# Patient Record
Sex: Male | Born: 1975 | Race: Black or African American | Hispanic: No | Marital: Single
Health system: Southern US, Community
[De-identification: ages and names within clinical notes are randomized; demographics above are authoritative.]

## PROBLEM LIST (undated history)

## (undated) DIAGNOSIS — J45909 Unspecified asthma, uncomplicated: Secondary | ICD-10-CM

## (undated) DIAGNOSIS — E559 Vitamin D deficiency, unspecified: Secondary | ICD-10-CM

---

## 2002-07-29 ENCOUNTER — Emergency Department (HOSPITAL_COMMUNITY): Admission: EM | Admit: 2002-07-29 | Discharge: 2002-07-29 | Payer: Self-pay | Admitting: Emergency Medicine

## 2002-08-04 ENCOUNTER — Emergency Department (HOSPITAL_COMMUNITY): Admission: EM | Admit: 2002-08-04 | Discharge: 2002-08-04 | Payer: Self-pay | Admitting: Emergency Medicine

## 2006-09-07 ENCOUNTER — Emergency Department (HOSPITAL_COMMUNITY): Admission: EM | Admit: 2006-09-07 | Discharge: 2006-09-07 | Payer: Self-pay | Admitting: Emergency Medicine

## 2006-09-23 ENCOUNTER — Inpatient Hospital Stay (HOSPITAL_COMMUNITY): Admission: EM | Admit: 2006-09-23 | Discharge: 2006-09-30 | Payer: Self-pay | Admitting: Emergency Medicine

## 2006-09-23 ENCOUNTER — Ambulatory Visit: Payer: Self-pay | Admitting: Hospitalist

## 2007-02-12 ENCOUNTER — Emergency Department (HOSPITAL_COMMUNITY): Admission: EM | Admit: 2007-02-12 | Discharge: 2007-02-12 | Payer: Self-pay | Admitting: Family Medicine

## 2010-04-14 ENCOUNTER — Emergency Department (HOSPITAL_COMMUNITY): Admission: EM | Admit: 2010-04-14 | Discharge: 2010-04-14 | Payer: Self-pay | Admitting: Emergency Medicine

## 2010-10-05 NOTE — Discharge Summary (Signed)
NAME:  Jacob Ho, Jacob Ho            ACCOUNT NO.:  [id number]   MEDICAL RECORD NO.:  [id number]          PATIENT TYPE:  INP   LOCATION:  6743                         FACILITY:  [facility]   PHYSICIAN:  Beatrix Fetters, M.D.     DATE OF BIRTH:  [birth year]   DATE OF ADMISSION:  09/23/2006  DATE OF DISCHARGE:  10/18/1998                               DISCHARGE SUMMARY   CONTINUITY PHYSICIAN:  Beatrix Fetters, M.D. in the [facility].   DISCHARGE DIAGNOSES:  1. Asthma exacerbation.  2. Asthma, chronic.   DISCHARGE MEDICATIONS:  1. Advair Diskus 250/50 one puff b.i.d.  2. Albuterol MDI 90 mcg 1-2 puffs every 6 hours p.r.n.  3. Prednisone 60 mg x1, 40 mg x1, 30 mg x1, 20 mg x1, 10 mg x1, and      then 10 mg x1.   DISPOSITION AND FOLLOWUP:  The patient is to follow up with Dr. Kathyrn Sheriff  in the [facility] on May 28 at 3:15 p.m.  At that  time, we have to review his asthma medication regimen as well as check  his peak load.   ADMITTING HISTORY AND PHYSICAL:  The patient is a 35 year old man with a  past medical history of asthma with a history of an asthma exacerbation  requiring intubation at age 75 as well as multiple emergency department  visits and one other asthma exacerbation requiring hospitalization in  his 12s, presenting with a 1-day history of difficulty breathing.  The  patient had been seen in the emergency department 2 weeks prior for his  first asthma exacerbation in 5 years.  He moved to [place]  approximately 1 year ago and has had difficulty with his seasonal  allergies since moving here.  When he was discharged 3 weeks ago, he was  discharged with an albuterol inhaler to be used as needed as well as a  prednisone taper; however, since his visit to the emergency department,  he has been having asthma symptoms requiring the albuterol inhaler 4  times a day as well as waking up in the middle of the night unable to  breathe 3 times a week.  Two  days prior to admission, the patient  realized that he was becoming more short of breath, but he could not  find his albuterol inhaler.  On the night prior to admission, his  shortness of breath was also very severe, but the patient went to bed  hoping that it would go away, and he woke up on the morning of admission  completely and totally unable to breathe and called 911.  He was  subsequently brought to the emergency department and given 125 mg of IV  Solu-Medrol and albuterol nebs with moderate improvement.   He has no known drug allergies, and he takes no regular medications.   PHYSICAL EXAMINATION:  VITAL SIGNS:  Temperature was 98.3, pulse was 89,  respirations 22, blood pressure 97/68, with an O2 sat of 93% on 2 liters  of oxygen.  CARDIOVASCULAR EXAM:  Significant for a mild tachycardic but regular  rhythm with no murmurs, rubs,  or gallops.  LUNGS EXAM:  Significant for severe inspiratory and expiratory wheezes  diffusely throughout both lung fields with moderate air movement.  ABDOMEN:  Soft, nontender, nondistended with positive bowel sounds.  EXTREMITIES:  Showed no cyanosis, clubbing, or edema.  He had no rashes.  The patient was able to move all 4 extremities, and strength was 5/5 and  symmetric.  NEURO:  Cranial nerves II-XII were grossly intact.  Gait was normal.  Affect was normal.   ADMITTING LABORATORY:  Sodium 142, potassium 3.6, chloride 106, bicarb  26, glucose 114, BUN 8, creatinine 1.31, total bilirubin 0.5, alk phos  102, AST 29, ALT 25.  Total protein 7.1, albumin 4.0, and calcium 9.4.  White count 9.9, hemoglobin 15.2, platelet count 218.   HOSPITAL COURSE:  1. Asthma exacerbation:  The patient was immediately placed on Solu-      Medrol 125 mg IV every 8 hours as well as albuterol and Atrovent      nebs schedule every 6 hours and albuterol and Atrovent nebs every 3      hours p.r.n.  The medication was subsequently changed to Xopenex      because the  patient became quite tachycardic, which has been      resolved.  The Solu-Medrol was decreased to 80 mg every 8 hours on      day 2 and then 60 mg IV every 12 the following day and then a      prednisone taper was started with 60 mg prednisone p.o. b.i.d., and      on the day of discharge, he was on 60 mg prednisone p.o. daily with      significant improvement in his symptoms.  The patient remained      wheezy throughout both bilateral lung fields throughout his      hospital stay except for the day of discharge when the wheezing had      resolved bilaterally.  2. Chronic asthma:  The patient was on no regular medication for his      asthma on admission, and with his history as well as his      presentation, it is clear that he has moderate persistent asthma,      which mandates use of an inhaled corticosteroid or inhaled long-      acting beta-agonist.  However, he was discharged on Advair Diskus      250/50 one puff b.i.d. and to use albuterol only as needed, and he      is to follow up in the clinic with Dr. Kathyrn Sheriff to make sure that he      is managing his medication regimen well.  He was also sent home on      his 7-day prednisone taper.  The patient will need regular medical      followup to manage his asthma and monitor his peak flow.  His peak      flows on discharge were 300 post bronchodilator.   DISCHARGE LABORATORY:  White count 11.5, hemoglobin 14.9, platelets 226.  Sodium 140, potassium 3.6, chloride 103, bicarb 27, glucose 100, BUN 12,  and creatinine 1.03.   DISCHARGE VITALS:  Temperature 98.1, pulse 56, respirations 20, blood  pressure 99/61, sating 99% on room air.      Beatrix Fetters, M.D.  Electronically Signed     CA/MEDQ  D:  09/30/2006  T:  09/30/2006  Job:  [id number]

## 2013-12-29 ENCOUNTER — Emergency Department (HOSPITAL_COMMUNITY): Payer: Self-pay

## 2013-12-29 ENCOUNTER — Emergency Department (HOSPITAL_COMMUNITY)
Admission: EM | Admit: 2013-12-29 | Discharge: 2013-12-29 | Disposition: A | Discharge status: Home / Self Care | Payer: Self-pay | Attending: Emergency Medicine | Admitting: Emergency Medicine

## 2013-12-29 ENCOUNTER — Encounter (HOSPITAL_COMMUNITY): Payer: Self-pay | Admitting: Emergency Medicine

## 2013-12-29 DIAGNOSIS — F17200 Nicotine dependence, unspecified, uncomplicated: Secondary | ICD-10-CM | POA: Insufficient documentation

## 2013-12-29 DIAGNOSIS — Z791 Long term (current) use of non-steroidal anti-inflammatories (NSAID): Secondary | ICD-10-CM | POA: Insufficient documentation

## 2013-12-29 DIAGNOSIS — J45901 Unspecified asthma with (acute) exacerbation: Secondary | ICD-10-CM

## 2013-12-29 DIAGNOSIS — J45909 Unspecified asthma, uncomplicated: Secondary | ICD-10-CM | POA: Insufficient documentation

## 2013-12-29 HISTORY — DX: Unspecified asthma, uncomplicated: J45.909

## 2013-12-29 MED ORDER — ALBUTEROL SULFATE (2.5 MG/3ML) 0.083% IN NEBU
2.5000 mg | INHALATION_SOLUTION | Freq: Once | RESPIRATORY_TRACT | Status: AC
  Administered 2013-12-29: 2.5 mg via RESPIRATORY_TRACT
  Filled 2013-12-29: qty 3

## 2013-12-29 MED ORDER — PREDNISONE 20 MG PO TABS
60.0000 mg | ORAL_TABLET | Freq: Once | ORAL | Status: AC
  Administered 2013-12-29: 60 mg via ORAL
  Filled 2013-12-29: qty 3

## 2013-12-29 MED ORDER — ALBUTEROL SULFATE HFA 108 (90 BASE) MCG/ACT IN AERS
2.0000 | INHALATION_SPRAY | RESPIRATORY_TRACT | Status: AC
  Administered 2013-12-29: 2 via RESPIRATORY_TRACT
  Filled 2013-12-29: qty 6.7

## 2013-12-29 MED ORDER — IPRATROPIUM-ALBUTEROL 0.5-2.5 (3) MG/3ML IN SOLN
3.0000 mL | Freq: Once | RESPIRATORY_TRACT | Status: AC
  Administered 2013-12-29: 3 mL via RESPIRATORY_TRACT
  Filled 2013-12-29: qty 3

## 2013-12-29 MED ORDER — ALBUTEROL SULFATE HFA 108 (90 BASE) MCG/ACT IN AERS: 2.0000 | INHALATION_SPRAY | RESPIRATORY_TRACT | Status: DC | PRN

## 2013-12-29 MED ORDER — PREDNISONE 20 MG PO TABS: 40.0000 mg | ORAL_TABLET | Freq: Every day | ORAL | Status: DC

## 2013-12-29 NOTE — ED Provider Notes (Signed)
CSN: [id number]     Arrival date & time 12/29/13  [id number] History   First MD Initiated Contact with Patient 12/29/13 0715     Chief Complaint  Patient presents with  . Asthma      HPI Pt was seen at 0720.  Per pt, c/o gradual onset and worsening of persistent cough, wheezing and SOB for the past several days, worse since overnight last night.  Describes his symptoms as "my asthma is acting up because of the weather." Pt states he ran out of his MDI.  Denies CP/palpitations, no back pain, no abd pain, no N/V/D, no fevers, no rash.     Past Medical History  Diagnosis Date  . Asthma    History reviewed. No pertinent past surgical history.  History  Substance Use Topics  . Smoking status: Current Some Day Smoker  . Smokeless tobacco: Never Used  . Alcohol Use: No    Review of Systems ROS: Statement: All systems negative except as marked or noted in the HPI; Constitutional: Negative for fever and chills. ; ; Eyes: Negative for eye pain, redness and discharge. ; ; ENMT: Negative for ear pain, hoarseness, nasal congestion, sinus pressure and sore throat. ; ; Cardiovascular: Negative for chest pain, palpitations, diaphoresis, and peripheral edema. ; ; Respiratory: +cough, wheezing, SOB. Negative for stridor. ; ; Gastrointestinal: Negative for nausea, vomiting, diarrhea, abdominal pain, blood in stool, hematemesis, jaundice and rectal bleeding. . ; ; Genitourinary: Negative for dysuria, flank pain and hematuria. ; ; Musculoskeletal: Negative for back pain and neck pain. Negative for swelling and trauma.; ; Skin: Negative for pruritus, rash, abrasions, blisters, bruising and skin lesion.; ; Neuro: Negative for headache, lightheadedness and neck stiffness. Negative for weakness, altered level of consciousness , altered mental status, extremity weakness, paresthesias, involuntary movement, seizure and syncope.        Allergies  Shellfish allergy  Home Medications   Prior to Admission  medications   Medication Sig Start Date End Date Taking? Authorizing Provider  ibuprofen (ADVIL,MOTRIN) 200 MG tablet Take 400-600 mg by mouth every 6 (six) hours as needed (pain).   Yes Historical Provider, MD   BP 118/71  Pulse 61  Temp(Src) 98.3 F (36.8 C) (Oral)  Resp 16  SpO2 97% Physical Exam 0725: Physical examination:  Nursing notes reviewed; Vital signs and O2 SAT reviewed;  Constitutional: Well developed, Well nourished, Well hydrated, In no acute distress; Head:  Normocephalic, atraumatic; Eyes: EOMI, PERRL, No scleral icterus; ENMT: Mouth and pharynx normal, Mucous membranes moist; Neck: Supple, Full range of motion, No lymphadenopathy; Cardiovascular: Regular rate and rhythm, No murmur, rub, or gallop; Respiratory: Breath sounds clear & equal bilaterally, faint scattered wheezes. No audible wheezing. Speaking full sentences with ease, Normal respiratory effort/excursion; Chest: Nontender, Movement normal; Abdomen: Soft, Nontender, Nondistended, Normal bowel sounds; Genitourinary: No CVA tenderness; Extremities: Pulses normal, No tenderness, No edema, No calf edema or asymmetry.; Neuro: AA&Ox3, Major CN grossly intact.  Speech clear. No gross focal motor or sensory deficits in extremities.; Skin: Color normal, Warm, Dry.   ED Course  Procedures    MDM  MDM Reviewed: previous chart, nursing note and vitals Interpretation: x-ray   Dg Chest 2 View 12/29/2013   CLINICAL DATA:  Shortness of breath and cough.  History of asthma.  EXAM: CHEST  2 VIEW  COMPARISON:  Chest radiograph performed 09/25/2006  FINDINGS: The lungs are well-aerated and mild peribronchial thickening is noted. There is no evidence of focal opacification, pleural effusion or pneumothorax.  A small rounded density at the left midlung zone is thought to reflect normal vasculature.  The heart is normal in size; the mediastinal contour is within normal limits. No acute osseous abnormalities are seen.  IMPRESSION: Mild  peribronchial thickening noted; lungs otherwise clear.   Electronically Signed   By: Roanna Raider M.D.   On: 12/29/2013 06:55    0820:  Pt had already received one short neb treatment before my evaluation. 2nd short neb treatment given with PO prednisone. Pt states he "feels better" after 2nd neb and steroid.  NAD, lungs CTA bilat, no wheezing, resps easy, speaking full sentences, Sats 97-100% R/A. Pt states he wants to go home now. Dx and testing d/w pt.  Questions answered.  Verb understanding, agreeable to d/c home with outpt f/u.     Samuel Jester, DO 12/30/13 1701

## 2013-12-29 NOTE — ED Notes (Signed)
Pt here for asthma attack that started this morning around midnight. Pt states the change in weather may have precipitated his attack. Pt states he ran out of his rescue inhaler. Expiratory wheezing noted. NAD

## 2013-12-29 NOTE — Discharge Instructions (Signed)
°Emergency Department Resource Guide °1) Find a Doctor and Pay Out of Pocket °Although you won't have to find out who is covered by your insurance plan, it is a good idea to ask around and get recommendations. You will then need to call the office and see if the doctor you have chosen will accept you as a new patient and what types of options they offer for patients who are self-pay. Some doctors offer discounts or will set up payment plans for their patients who do not have insurance, but you will need to ask so you aren't surprised when you get to your appointment. ° °2) Contact Your Local Health Department °Not all health departments have doctors that can see patients for sick visits, but many do, so it is worth a call to see if yours does. If you don't know where your local health department is, you can check in your phone book. The CDC also has a tool to help you locate your state's health department, and many state websites also have listings of all of their local health departments. ° °3) Find a Walk-in Clinic °If your illness is not likely to be very severe or complicated, you may want to try a walk in clinic. These are popping up all over the country in pharmacies, drugstores, and shopping centers. They're usually staffed by nurse practitioners or physician assistants that have been trained to treat common illnesses and complaints. They're usually fairly quick and inexpensive. However, if you have serious medical issues or chronic medical problems, these are probably not your best option. ° °No Primary Care Doctor: °- Call [facility] at  832-8000 - they can help you locate a primary care doctor that  accepts your insurance, provides certain services, etc. °- Physician Referral Service- 1-800-533-3463 ° °Chronic Pain Problems: °Organization         Address  Phone   Notes  °[facility]  (336) 297-2271 Patients need to be referred by their primary care doctor.  ° °Medication  Assistance: °Organization         Address  Phone   Notes  °[place] [facility] [address] °[place], [place] [address] (336) 641-8030 --Must be a resident of [place] °-- Must have NO insurance coverage whatsoever (no Medicaid/ Medicare, etc.) °-- The pt. MUST have a primary care doctor that directs their care regularly and follows them in the community °  °[facility]  (866) 331-1348   °[employer]  (888) 892-1162   ° °Agencies that provide inexpensive medical care: °Organization         Address  Phone   Notes  °[facility]  (336) 832-8035   °[facility]    (336) 832-7272   °[facility] [address] °[place], [place] [address] (336) 832-4777   °[facility] [address], °[place] (336) 271-4999   °[facility]    (336) 373-0678   °[facility]    (336) 272-1050   °[facility] ° [address], [place] Phone:  (336) 832-4444, Fax:  (336) 832-4440 Hours of Operation:  9 am - 6 pm, M-F.  Also accepts Medicaid/Medicare and self-pay.  °[facility] ° [address], [place] Phone: (336) 832-3150, Fax: (336) 832-3151. Hours of Operation:  8:30 am - 5:30 pm, M-F.  Also accepts Medicaid and self-pay.  °[facility] [place] [address], [place] Phone: (336) 878-6027   °[facility] [address], [place], [place] (336)723-1848, Ext. 123 Mondays & Thursdays: 7-9 AM.  First 15 patients are seen on a first come, first serve basis. °  ° °Medicaid-accepting [place] Providers: ° °Organization         Address  Phone   Notes  °[facility] [address], [place] (336) 641-2100 Also accepts self-pay patients.  °[facility] [address], [place] ° (336) 856-9996   °[facility] [address], [place]  (336) 288-8857   °[facility] [address] [place] [address], [place] (336) 299-7000   °Veita Bland [address], [place]  ° (336) 373-1557 Only accepts [place] Access Medicaid patients after they have their name applied to their card.  ° °Self-Pay (no insurance) in [place]: ° °Organization         Address  Phone   Notes  °Sickle Cell Patients, [facility] [address], [place] (336) 832-1970   °[facility] [address], [place] (336) 832-4400   °[facility] ° [address], [place] (336) 992-4800   °[facility]/Dr. Osei-Bonsu ° [address] [place] [address], [place] or [address], [place] (336) 841-8500 Phone number for both [place] and [place] locations is the same.  °[facility] [address], [place] (336) 299-0000   °[facility] [address] [place] [address], [place] or [address] (336) 852-7530 °(336) 878-2260   °[facility] [address], [place] (336) 350-1642, phone; (336) 294-5005, fax Sees patients 1st and 3rd Saturday of every month.  Must not qualify for public or private insurance (i.e. Medicaid, Medicare, [place] Health Choice, Veterans' Benefits) • Household income should be no more than 200% of the poverty level •The clinic cannot treat you if you are pregnant or think you are pregnant • Sexually transmitted diseases are not treated at the clinic.  ° ° °Dental Care: °Organization         Address  Phone  Notes  °[place] [facility] [address], [place] (336) 641-6152 Accepts children up to age 21 who are enrolled in Medicaid or [place] Health Choice; pregnant women with a Medicaid card; and children who have applied for Medicaid or [place] Health Choice, but were declined, whose parents can pay a reduced fee at time of service.  °[place]  [facility] [place]  [address], [place] (336) 641-7733 Accepts children up to age 21 who are enrolled in Medicaid or [place] Health Choice; pregnant women with a Medicaid card; and children who have applied for Medicaid or [place] Health Choice, but were declined, whose parents can pay a reduced fee at time of service.  °[facility] ° [address], [place] (336) 641-4533 Patients are seen by appointment only. Walk-ins are not accepted. [facility] will see patients 18 years of age and older. °Monday - Tuesday (8am-5pm) °Most Wednesdays (8:30-5pm) °$30 per visit, cash only  °[facility] ° [address], [place] (336) 641-4533 Patients are seen by appointment only. Walk-ins are not accepted. [facility] will see patients 18 years of age and older. °One   Wednesday Evening (Monthly: Volunteer Based).  $30 per visit, cash only  °[facility]  (919) 537-3737 for adults; Children under age 4, call Graduate Pediatric Dentistry at (919) [phone]. Children aged 4-14, please call (919) [phone] to request a pediatric application. ° Dental services are provided in all areas of dental care including fillings, crowns and bridges, complete and partial dentures, implants, gum treatment, root canals, and extractions. Preventive care is also provided. Treatment is provided to both adults and children. °Patients are selected via a lottery and there is often a waiting list. °  °[facility] [address], °[place] ° (336) 763-8833 [email or web address] °  °[facility] [address], [place], [place] (336)723-1848, Ext. 123 Second and Fourth Thursday of each month, opens at 6:30 AM; Clinic ends at 9 AM.  Patients are seen on a first-come first-served basis, and a limited number are seen during each clinic.  ° °[facility] ° [address], [place], [place] (336) 723-7904    Eligibility Requirements °You must have lived in [place], [place], or [place] counties for at least the last three months. °  You cannot be eligible for state or federal sponsored healthcare insurance, including Veterans Administration, Medicaid, or Medicare. °  You generally cannot be eligible for healthcare insurance through your employer.  °  How to apply: °Eligibility screenings are held every Tuesday and Wednesday afternoon from 1:00 pm until 4:00 pm. You do not need an appointment for the interview!  °[facility] [address], [place], [place] 336-631-2330   °[facility]  336-342-8273   °[facility]  336-703-3100   °[facility]  336-570-6415   ° °Behavioral Health Resources in the Community: °Intensive Outpatient Programs °Organization         Address  Phone  Notes  °[facility] [address], [place], [place] 336-878-6098   °[facility] [address], [place], [place] 336-832-9800   °[facility] [address], [place], [place] ° 336-882-2125   °[facility] [address],  °[place], [place] 1-800-853-5163 or 336-641-4981   °Substance Abuse Resources °Organization         Address  Phone  Notes  °[facility]  336-882-2125   °[facility]  336-784-9470   °[facility]  336-285-9073   °[facility]  336-845-3988   °Residential & Outpatient Substance Abuse Program  1-800-659-3381   °Psychological Services °Organization         Address  Phone  Notes  °[facility]  336- 832-9600   °[facility]  336- 378-7881   °[facility] [address], [place] 1-800-853-5163 or 336-641-4981   ° °Mobile Crisis Teams °Organization         Address  Phone  Notes  °[facility], Mobile Crisis Care Unit  1-877-626-1772   °Assertive °[facility] ° [address]  [place], [place] 336-834-9664   °Sharon DeEsch [address] °[place] 336-554-5454   ° °Self-Help/Support Groups °Organization         Address  Phone             Notes  °[facility] of  - variety of support groups  336- 373-1402 Call for more information  °Narcotics Anonymous (NA), [facility] [address], °[place]  2 meetings at this location  ° °  Residential Treatment Programs Organization         Address  Phone  Notes  [facility] [address],    [place]  [phone]   [facility]  [address], [place] [phone], [place], [place] [phone]   [facility] [address], [place] [phone] Admissions: 8am-3pm M-F  [facility] [address]    [place], [place] 295-284-1324([phone])[phone]   [facility] [address], [place], [place] [phone]   [facility] [address]  [place], [place] [phone])[phone]   [facility] Intensive Outpatient [address], [place], [place] [phone]   [facility] ([facility] [address]  [place], [place] [phone] or ([phone]   [facility] ([facility]) [address] [place], [place] 323-557-3220(816) 171-8876 Accepts Medicaid  [facility] [address]  [place] [phone] Substance Abuse/Addiction Treatment   [facility] Resources Organization         Address  Phone  Notes  [facility]  918-519-1487(888) 820-672-0086   Angie FavaJulie Brannon, PhD [address], [address] [place], [place]   [phone](336) [phone] or [phone](336) [phone]   [facility]   [address] [place], [place] ([phone](336) [phone]   [facility] [address], [place], [place] [phone]) [phone] Insurance/Medicaid/sponsorship through [facility] [address]                                    [place], [place] [phone]) [phone] Therapy/tele-psych/case    [facility] [address]   [place], [place] [phone](336) [phone]    Dr. Lolly MustacheArfeen  [phone](336) [phone]   [facility]  [employer] [facility] 1) [address], [place] 2) [address], [place] 3)  [address], [place] ([phone](336) [phone](336) [phone]  [phone](336) [phone]   [facility] Child [facility] Hot[facility] 778-622-0151(336) 939 251 3848 or 754-025-5276(336) 414-336-3621 (After Hours)       Take the prescriptions as directed.  Use your albuterol inhaler (2 to 4 puffs) every 4 hours for the next 7 days, then as needed for cough, wheezing, or shortness of breath.  Call your regular medical doctor this morning to schedule a follow up appointment within the next 3 days.  Return to the Emergency Department immediately sooner if worsening.

## 2013-12-29 NOTE — ED Notes (Signed)
Pt c/o asthma attack starting around midnight. Audible wheezing noted. Pt out of rescue inhaler

## 2014-01-04 ENCOUNTER — Emergency Department (HOSPITAL_COMMUNITY)
Admission: EM | Admit: 2014-01-04 | Discharge: 2014-01-04 | Disposition: A | Discharge status: Home / Self Care | Payer: Self-pay | Attending: Emergency Medicine | Admitting: Emergency Medicine

## 2014-01-04 DIAGNOSIS — R062 Wheezing: Secondary | ICD-10-CM | POA: Insufficient documentation

## 2014-01-04 DIAGNOSIS — Z79899 Other long term (current) drug therapy: Secondary | ICD-10-CM | POA: Insufficient documentation

## 2014-01-04 DIAGNOSIS — F17200 Nicotine dependence, unspecified, uncomplicated: Secondary | ICD-10-CM | POA: Insufficient documentation

## 2014-01-04 DIAGNOSIS — Z791 Long term (current) use of non-steroidal anti-inflammatories (NSAID): Secondary | ICD-10-CM | POA: Insufficient documentation

## 2014-01-04 DIAGNOSIS — J45901 Unspecified asthma with (acute) exacerbation: Secondary | ICD-10-CM | POA: Insufficient documentation

## 2014-01-04 MED ORDER — ALBUTEROL SULFATE (2.5 MG/3ML) 0.083% IN NEBU
5.0000 mg | INHALATION_SOLUTION | Freq: Once | RESPIRATORY_TRACT | Status: AC
  Administered 2014-01-04: 5 mg via RESPIRATORY_TRACT
  Filled 2014-01-04: qty 6

## 2014-01-04 MED ORDER — ALBUTEROL SULFATE HFA 108 (90 BASE) MCG/ACT IN AERS: 2.0000 | INHALATION_SPRAY | RESPIRATORY_TRACT | Status: DC | PRN

## 2014-01-04 MED ORDER — IPRATROPIUM BROMIDE 0.02 % IN SOLN
0.5000 mg | Freq: Once | RESPIRATORY_TRACT | Status: AC
  Administered 2014-01-04: 0.5 mg via RESPIRATORY_TRACT
  Filled 2014-01-04: qty 2.5

## 2014-01-04 MED ORDER — PREDNISONE 20 MG PO TABS: 40.0000 mg | ORAL_TABLET | Freq: Every day | ORAL | Status: DC

## 2014-01-04 MED ORDER — ALBUTEROL SULFATE (2.5 MG/3ML) 0.083% IN NEBU: 2.5000 mg | INHALATION_SOLUTION | Freq: Once | RESPIRATORY_TRACT | Status: DC

## 2014-01-04 MED ORDER — IPRATROPIUM BROMIDE 0.02 % IN SOLN
0.5000 mg | Freq: Once | RESPIRATORY_TRACT | Status: AC
Start: 2014-01-04 — End: 2014-01-04
  Administered 2014-01-04: 0.5 mg via RESPIRATORY_TRACT
  Filled 2014-01-04: qty 2.5

## 2014-01-04 MED ORDER — ALBUTEROL SULFATE HFA 108 (90 BASE) MCG/ACT IN AERS
2.0000 | INHALATION_SPRAY | RESPIRATORY_TRACT | Status: DC | PRN
Start: 2014-01-04 — End: 2014-01-05
  Administered 2014-01-04: 2 via RESPIRATORY_TRACT
  Filled 2014-01-04: qty 6.7

## 2014-01-04 MED ORDER — PREDNISONE 20 MG PO TABS
60.0000 mg | ORAL_TABLET | Freq: Once | ORAL | Status: AC
  Administered 2014-01-04: 60 mg via ORAL
  Filled 2014-01-04: qty 3

## 2014-01-04 NOTE — ED Notes (Signed)
Pt ambulated in hall on room air O2 sats 99%.

## 2014-01-04 NOTE — ED Notes (Signed)
Declined W/C at D/C and was escorted to lobby by RN. 

## 2014-01-04 NOTE — Discharge Instructions (Signed)

## 2014-01-04 NOTE — ED Provider Notes (Signed)
CSN: [id number]     Arrival date & time 01/04/14  1929 History   First MD Initiated Contact with Patient 01/04/14 2011     Chief Complaint  Patient presents with  . Wheezing     (Consider location/radiation/quality/duration/timing/severity/associated sxs/prior Treatment) HPI Comments: Patient is a 38 year old male with a history of asthma who presents to the emergency department for shortness of breath and wheezing. Patient states that symptoms are consistent with an asthma exacerbation and have been worsening since yesterday evening. Patient states that he works for a [employer] and was a round a lot of dust and pollen in the last 24 hours. Patient states that he tried to find his inhaler for symptom relief, but lost it in [place] a one week ago. Patient denies associated fever, cough, lightheadedness, syncope, nausea, vomiting, chest pain, and weakness. He denies contact with sick persons. Patient is a semi-regular smoker.  Patient is a 38 y.o. male presenting with wheezing. The history is provided by the patient. No language interpreter was used.  Wheezing Associated symptoms: chest tightness and shortness of breath   Associated symptoms: no cough     Past Medical History  Diagnosis Date  . Asthma    No past surgical history on file. No family history on file. History  Substance Use Topics  . Smoking status: Current Some Day Smoker  . Smokeless tobacco: Never Used  . Alcohol Use: No    Review of Systems  Respiratory: Positive for chest tightness, shortness of breath and wheezing. Negative for cough.   All other systems reviewed and are negative.    Allergies  Shellfish allergy  Home Medications   Prior to Admission medications   Medication Sig Start Date End Date Taking? Authorizing Provider  ibuprofen (ADVIL,MOTRIN) 200 MG tablet Take 400-600 mg by mouth every 6 (six) hours as needed (pain).   Yes Historical Provider, MD  albuterol (PROVENTIL HFA;VENTOLIN HFA)  108 (90 BASE) MCG/ACT inhaler Inhale 2 puffs into the lungs every 4 (four) hours as needed for wheezing or shortness of breath. 01/04/14   Antony Madura, PA-C  predniSONE (DELTASONE) 20 MG tablet Take 2 tablets (40 mg total) by mouth daily. 01/04/14   Antony Madura, PA-C   BP 132/88  Pulse 73  Temp(Src) 97.7 F (36.5 C)  Resp 18  SpO2 95%  Physical Exam  Nursing note and vitals reviewed. Constitutional: He is oriented to person, place, and time. He appears well-developed and well-nourished. No distress.  Nontoxic/nonseptic appearing  HENT:  Head: Normocephalic and atraumatic.  Eyes: Conjunctivae and EOM are normal. No scleral icterus.  Neck: Normal range of motion.  Cardiovascular: Normal rate, regular rhythm and normal heart sounds.   Pulmonary/Chest: Effort normal. No respiratory distress. He has wheezes. He has no rales.  No tachypnea or dyspnea. Patient speaking in full sentences. Chest expansion symmetrical. Diffuse expiratory wheezing appreciated.  Musculoskeletal: Normal range of motion.  Neurological: He is alert and oriented to person, place, and time. He exhibits normal muscle tone. Coordination normal.  GCS 15. Patient moves extremities without ataxia.  Skin: Skin is warm and dry. No rash noted. He is not diaphoretic. No erythema. No pallor.  Psychiatric: He has a normal mood and affect. His behavior is normal.    ED Course  Procedures (including critical care time) Labs Review Labs Reviewed - No data to display  Imaging Review No results found.   EKG Interpretation None      MDM   Final diagnoses:  Asthma exacerbation,  mild    Patient presents to the ED for wheezing and SOB c/w past asthma exacerbations. Patient tx in ED with prednisone and DuoNeb x 2. Following treatments, patient ambulated in ED with O2 saturations maintained at or above 99%, no current signs of respiratory distress. Lung exam improved after nebulizer treatment. Doubt pneumonia given lack of  fever, tachypnea, and hypoxia; do not believe further workup or imaging is indicated. Patient will be discharged with 5 day burst of oral steroids as well as an albuterol inhaler. Patient states they are breathing at baseline. Patient has been instructed to continue using prescribed medications and to speak with PCP about today's exacerbation. Return precautions discussed in provided. Patient agreeable to plan with no unaddressed concerns.   Filed Vitals:   01/04/14 1944 01/04/14 2139  BP: 119/87 132/88  Pulse: 78 73  Temp: 98.2 F (36.8 C) 97.7 F (36.5 C)  Resp: 24 18  SpO2: 100% 95%      Antony MaduraKelly Sommer Spickard, PA-C 01/04/14 2159

## 2014-01-04 NOTE — ED Notes (Signed)
Pt believes he lost inhaler; works for [employer] and around a lot of dust; cannot find it. Sats 100% and moving air. Wheezing bilaterally

## 2014-01-05 NOTE — ED Provider Notes (Signed)
Medical screening examination/treatment/procedure(s) were performed by non-physician practitioner and as supervising physician I was immediately available for consultation/collaboration.   EKG Interpretation None        Jacob Ho Jacob Iriel Nason, MD 01/05/14 1039 

## 2014-02-16 ENCOUNTER — Encounter (HOSPITAL_COMMUNITY): Payer: Self-pay | Admitting: Emergency Medicine

## 2014-02-16 ENCOUNTER — Observation Stay (HOSPITAL_COMMUNITY)
Admission: EM | Admit: 2014-02-16 | Discharge: 2014-02-18 | Disposition: A | Discharge status: Home / Self Care | Payer: Self-pay | Attending: Emergency Medicine | Admitting: Emergency Medicine

## 2014-02-16 ENCOUNTER — Emergency Department (HOSPITAL_COMMUNITY): Payer: Self-pay

## 2014-02-16 DIAGNOSIS — R000 Tachycardia, unspecified: Secondary | ICD-10-CM | POA: Insufficient documentation

## 2014-02-16 DIAGNOSIS — Z79899 Other long term (current) drug therapy: Secondary | ICD-10-CM | POA: Insufficient documentation

## 2014-02-16 DIAGNOSIS — J4541 Moderate persistent asthma with (acute) exacerbation: Principal | ICD-10-CM | POA: Insufficient documentation

## 2014-02-16 DIAGNOSIS — J45901 Unspecified asthma with (acute) exacerbation: Secondary | ICD-10-CM | POA: Diagnosis present

## 2014-02-16 DIAGNOSIS — Z720 Tobacco use: Secondary | ICD-10-CM | POA: Insufficient documentation

## 2014-02-16 LAB — CBC
HEMATOCRIT: 47.1 % (ref 39.0–52.0)
HEMOGLOBIN: 16.3 g/dL (ref 13.0–17.0)
MCH: 29.4 pg (ref 26.0–34.0)
MCHC: 34.6 g/dL (ref 30.0–36.0)
MCV: 84.9 fL (ref 78.0–100.0)
PLATELETS: 203 10*3/uL (ref 150–400)
RBC: 5.55 MIL/uL (ref 4.22–5.81)
RDW: 13.8 % (ref 11.5–15.5)
WBC: 8.9 10*3/uL (ref 4.0–10.5)

## 2014-02-16 LAB — BASIC METABOLIC PANEL
ANION GAP: 11 (ref 5–15)
BUN: 9 mg/dL (ref 6–23)
CALCIUM: 8.7 mg/dL (ref 8.4–10.5)
CHLORIDE: 109 meq/L (ref 96–112)
CO2: 25 meq/L (ref 19–32)
CREATININE: 1.1 mg/dL (ref 0.50–1.35)
GFR calc Af Amer: >90 mL/min (ref >90)
GFR calc non Af Amer: 82 mL/min — ABNORMAL LOW (ref >90)
GLUCOSE: 106 mg/dL — AB (ref 70–99)
Potassium: 3.9 mEq/L (ref 3.7–5.3)
Sodium: 145 mEq/L (ref 137–147)

## 2014-02-16 MED ORDER — ALBUTEROL (5 MG/ML) CONTINUOUS INHALATION SOLN
10.0000 mg/h | INHALATION_SOLUTION | Freq: Once | RESPIRATORY_TRACT | Status: AC
  Administered 2014-02-16: 10 mg/h via RESPIRATORY_TRACT
  Filled 2014-02-16: qty 20

## 2014-02-16 MED ORDER — ALBUTEROL SULFATE (2.5 MG/3ML) 0.083% IN NEBU
2.5000 mg | INHALATION_SOLUTION | RESPIRATORY_TRACT | Status: DC | PRN
Start: 2014-02-16 — End: 2014-02-19

## 2014-02-16 MED ORDER — PNEUMOCOCCAL VAC POLYVALENT 25 MCG/0.5ML IJ INJ
0.5000 mL | INJECTION | INTRAMUSCULAR | Status: AC
  Administered 2014-02-17: 0.5 mL via INTRAMUSCULAR
  Filled 2014-02-16: qty 0.5

## 2014-02-16 MED ORDER — SODIUM CHLORIDE 0.9 % IJ SOLN
3.0000 mL | Freq: Two times a day (BID) | INTRAMUSCULAR | Status: DC
  Administered 2014-02-16 – 2014-02-18 (×4): 3 mL via INTRAVENOUS

## 2014-02-16 MED ORDER — INFLUENZA VAC SPLIT QUAD 0.5 ML IM SUSY
0.5000 mL | PREFILLED_SYRINGE | INTRAMUSCULAR | Status: AC
  Administered 2014-02-17: 0.5 mL via INTRAMUSCULAR
  Filled 2014-02-16: qty 0.5

## 2014-02-16 MED ORDER — IPRATROPIUM BROMIDE 0.02 % IN SOLN: 0.5000 mg | RESPIRATORY_TRACT | Status: DC

## 2014-02-16 MED ORDER — ACETAMINOPHEN 650 MG RE SUPP: 650.0000 mg | Freq: Four times a day (QID) | RECTAL | Status: DC | PRN

## 2014-02-16 MED ORDER — ALBUTEROL SULFATE (2.5 MG/3ML) 0.083% IN NEBU: 2.5000 mg | INHALATION_SOLUTION | RESPIRATORY_TRACT | Status: DC

## 2014-02-16 MED ORDER — IPRATROPIUM BROMIDE 0.02 % IN SOLN
0.5000 mg | Freq: Once | RESPIRATORY_TRACT | Status: AC
  Administered 2014-02-16: 0.5 mg via RESPIRATORY_TRACT
  Filled 2014-02-16: qty 2.5

## 2014-02-16 MED ORDER — ENOXAPARIN SODIUM 40 MG/0.4ML ~~LOC~~ SOLN
40.0000 mg | SUBCUTANEOUS | Status: DC
  Administered 2014-02-16 – 2014-02-18 (×3): 40 mg via SUBCUTANEOUS
  Filled 2014-02-16 (×3): qty 0.4

## 2014-02-16 MED ORDER — IPRATROPIUM-ALBUTEROL 0.5-2.5 (3) MG/3ML IN SOLN
3.0000 mL | RESPIRATORY_TRACT | Status: DC
  Administered 2014-02-16 – 2014-02-18 (×14): 3 mL via RESPIRATORY_TRACT
  Filled 2014-02-16 (×11): qty 3

## 2014-02-16 MED ORDER — PREDNISONE 50 MG PO TABS
60.0000 mg | ORAL_TABLET | Freq: Every day | ORAL | Status: DC
  Administered 2014-02-16 – 2014-02-18 (×3): 60 mg via ORAL
  Filled 2014-02-16 (×5): qty 1

## 2014-02-16 MED ORDER — LORATADINE 10 MG PO TABS
10.0000 mg | ORAL_TABLET | Freq: Every day | ORAL | Status: DC
  Administered 2014-02-16 – 2014-02-18 (×3): 10 mg via ORAL
  Filled 2014-02-16 (×3): qty 1

## 2014-02-16 MED ORDER — ACETAMINOPHEN 325 MG PO TABS
650.0000 mg | ORAL_TABLET | Freq: Four times a day (QID) | ORAL | Status: DC | PRN
  Administered 2014-02-16: 650 mg via ORAL
  Filled 2014-02-16: qty 2

## 2014-02-16 NOTE — H&P (Signed)
Date: 02/16/2014               Patient Name:  Jacob Ho MRN: [id number]  DOB: 12-01-1975 Age / Sex: 38 y.o., male   PCP: No Pcp Per Patient         Medical Service: Internal Medicine Teaching Service         Attending Physician: Dr. Levert Feinstein, MD    First Contact: Jacob Ho Pager: [phone]  Second Contact: Jacob Ho Pager: [phone]       After Hours (After 5p/  First Contact Pager: [phone]  weekends / holidays): Second Contact Pager: [phone]   Chief Complaint: shortness of breath, wheezing  History of Present Illness: Jacob Ho is a 38 yo male with PMHx of asthma who presents to the ED with complaint of shortness of breath and wheezing. Patient states that for the past two days he has had shortness of breath, wheezing, chest tightness, productive cough with clear phlegm, chills, and diaphoresis. He has been trying to manage his symptoms at home and by using his Ventolin inhaler, but it was not helping and he ran out of his ventolin inhaler. Patient states for the past 1.5 months he has had worsening asthma and several exacerbations that he has had to come to the ED for, but had not been admitted. He admits to waking up nightly for the past one month with shortness of breath requiring albuterol inhaler. He uses his albuterol inhaler about 5 times per day. Previously, his symptoms were controlled for over 10 years and had just worsened in the last couple months. He attributes this to the change in the weather and the season. He has a history of childhood asthma and had to be intubated when he was 14 or88 years old. Patient thinks his current exacerbation is due to weather and season change. He does smoke, but has decreased to 1 black and mild cigarette a day. He was previously smoking for 22 years but quit 16 months ago. He does smoke marijuana every couple of months. He denies alcohol or other drug use.   Social History: Sexually active with one male partner. Has 2  kids ages 39 and 14. Works for a [employer].  Family Hx: Mother is a diabetic and also has asthma. Father is deceased and past away from an MI in 29. Brother has HTN and asthma.  Meds: Current Facility-Administered Medications  Medication Dose Route Frequency Provider Last Rate Last Dose  . acetaminophen (TYLENOL) tablet 650 mg  650 mg Oral Q6H PRN Jacob Rung, DO       Or  . acetaminophen (TYLENOL) suppository 650 mg  650 mg Rectal Q6H PRN Jacob Rung, DO      . albuterol (PROVENTIL) (2.5 MG/3ML) 0.083% nebulizer solution 2.5 mg  2.5 mg Nebulization Q4H Jacob Rung, DO      . albuterol (PROVENTIL) (2.5 MG/3ML) 0.083% nebulizer solution 2.5 mg  2.5 mg Nebulization Q2H PRN Jacob Rung, DO      . enoxaparin (LOVENOX) injection 40 mg  40 mg Subcutaneous Q24H Jacob Rung, DO   40 mg at 02/16/14 1715  . ipratropium (ATROVENT) nebulizer solution 0.5 mg  0.5 mg Nebulization Q4H Jacob Rung, DO      . predniSONE (DELTASONE) tablet 60 mg  60 mg Oral Q breakfast Jacob Rung, DO   60 mg at 02/16/14 1714  . sodium chloride 0.9 % injection 3 mL  3 mL Intravenous Q12H  Jacob RungErik C Hoffman, DO        Allergies: Allergies as of 02/16/2014 - Review Complete 02/16/2014  Allergen Reaction Noted  . Shellfish allergy Anaphylaxis 12/29/2013   Past Medical History  Diagnosis Date  . Asthma    History reviewed. No pertinent past surgical history. Family History  Problem Relation Age of Onset  . Diabetes Mother   . Heart attack Father   . Asthma Sister   . Asthma Mother    History   Social History  . Marital Status: Single    Spouse Name: N/A    Number of Children: N/A  . Years of Education: N/A   Occupational History  . Not on file.   Social History Main Topics  . Smoking status: Current Some Day Smoker    Start date: 05/21/1991  . Smokeless tobacco: Never Used     Comment: smoking black and milds 1 a day  . Alcohol Use: No  . Drug Use: Yes     Comment: marijuana  occasionally  . Sexual Activity: Yes    Partners: Female   Other Topics Concern  . Not on file   Social History Narrative  . No narrative on file    Review of Systems: General: Admits to chills and diaphoresis, but denies fever, fatigue, change in appetite.  Respiratory: Admits to SOB, productive cough, chest tightness, and wheezing.   Cardiovascular: Denies chest pain and palpitations.  Gastrointestinal: Denies nausea, vomiting, abdominal pain, diarrhea, constipation, blood in stool and abdominal distention.  Genitourinary: Denies dysuria, urgency, frequency, hematuria, suprapubic pain and flank pain. Endocrine: Denies hot or cold intolerance, polyuria, and polydipsia. Musculoskeletal: Denies myalgias, back pain, joint swelling, arthralgias and gait problem.  Skin: Denies pallor, rash and wounds.  Neurological: Denies dizziness, headaches, weakness, lightheadedness, numbness,seizures, and syncope, Psychiatric/Behavioral: Denies mood changes, confusion, nervousness, sleep disturbance and agitation.  Physical Exam: Filed Vitals:   02/16/14 1445 02/16/14 1515 02/16/14 1545 02/16/14 1700  BP: 127/65 116/72 118/64 134/83  Pulse: 104 108 99 92  Temp:    98.3 F (36.8 C)  TempSrc:    Oral  Resp: 18  23 22   Height:    5\' 9"  (1.753 m)  Weight:    83.915 kg (185 lb)  SpO2: 96% 97% 96% 98%   General: Vital signs reviewed.  Patient is well-developed and well-nourished, in no acute distress and cooperative with exam.  Cardiovascular: Tachycardic, regular rhythm, S1 normal, S2 normal, no murmurs, gallops, or rubs. Pulmonary/Chest: Diffuse expiratory wheezes, no rales, or rhonchi. No respiratory distress, no accessory muscle use. Abdominal: Soft, non-tender, non-distended, BS +, no masses, organomegaly, or guarding present.  Musculoskeletal: No joint deformities, erythema, or stiffness, ROM full and nontender. Extremities: No lower extremity edema bilaterally,  pulses symmetric and intact  bilaterally. No cyanosis or clubbing. Neurological: A&O x3, Strength is normal and symmetric bilaterally, no focal motor deficit, sensory intact to light touch bilaterally.  Skin: Warm, dry and intact. No rashes or erythema. Psychiatric: Normal mood and affect. speech and behavior is normal. Cognition and memory are normal.    Lab results: Basic Metabolic Panel:  Recent Labs  16/02/9608/30/15 1229  NA 145  K 3.9  CL 109  CO2 25  GLUCOSE 106*  BUN 9  CREATININE 1.10  CALCIUM 8.7    CBC:  Recent Labs  02/16/14 1229  WBC 8.9  HGB 16.3  HCT 47.1  MCV 84.9  PLT 203   Imaging results:  Dg Chest Portable 1 View  02/16/2014  CLINICAL DATA:  Chest pain, shortness of breath  EXAM: PORTABLE CHEST - 1 VIEW  COMPARISON:  12/29/2013  FINDINGS: The heart size and mediastinal contours are within normal limits. Both lungs are clear. The visualized skeletal structures are unremarkable.  IMPRESSION: No active disease.   Electronically Signed   By: Elige Ko   On: 02/16/2014 12:44   Assessment & Plan by Problem: Active Problems:   Asthma exacerbation  Asthma Exacerbation: Patient presents with dyspnea, chest tightness and wheezing worsening over the past 2 days. Patient has a history of asthma since childhood. He has been on many medications in the past but is currently only on albuterol inhaler that does not control his symptoms well. Patient has severe persistent asthma based on his frequent use of albuterol, nightly awakenings, and daily symptoms. We are unsure of his lung function. Patient should be on medium dose of inhaled glucocorticoids and a long acting beta agonist and albuterol prn. Patient was afebrile at 97.5, normotensive, RR 15, pulse ox 100% on T-Bar 5L/min, but was saturating 98% on room air when we saw him. Labs were within normal limits, no leukocytosis. CXR showed no active disease. Patient had diffuse expiratory wheezing on exam. Patient received albuterol and ipratropium  nebulizer in the ED. -Prednisone 60 mg daily -Ipratropium and Albuterol nebulizer Q4H -Albuterol nebulizer Q2H prn wheezing -BMET tomorrow morning -Regular diet -Telemetry -Oxygen supplementation as needed -Peak expiratory flow rate for asthma monitoring  DVT/PE ppx: Lovenox 40 mg SQ daily  Dispo: Disposition is deferred at this time, awaiting improvement of current medical problems. Anticipated discharge in approximately 1-2 day(s).   The patient does not have a current PCP (No Pcp Per Patient) and does not need an [facility] hospital follow-up appointment after discharge.  The patient does not have transportation limitations that hinder transportation to clinic appointments.  Signed: Jill Alexanders, DO PGY-1 Internal Medicine Resident Pager # [phone] 02/16/2014 5:20 PM

## 2014-02-16 NOTE — ED Notes (Signed)
Attempted report 

## 2014-02-16 NOTE — ED Provider Notes (Signed)
CSN: [id number]     Arrival date & time 02/16/14  1142 History   First MD Initiated Contact with Patient 02/16/14 1148     Chief Complaint  Patient presents with  . Shortness of Breath     (Consider location/radiation/quality/duration/timing/severity/associated sxs/prior Treatment) HPI Comments: Patient presents with worsening shortness of breath over the past several days.  Complains of multiple asthma exacerbations in the last couple months.  Denies any fevers, chills, cough, or viral URI symptoms.  Does have a dog and admits to smoking cigars occasionally, but reports this is not new.  Denies any chest pain or IV drug use.  He does not have a PCP, and denies asthma problems for the last 10 years prior to recently.  He has been using albuterol multiple times a day for the past month, and endorses nighttime symptoms 3-4 times a week , as well as daytime symptoms ~ 5 days a week.   Patient is a 38 y.o. male presenting with shortness of breath. The history is provided by the patient.  Shortness of Breath Severity:  Moderate Onset quality:  Gradual Timing:  Constant Progression:  Worsening Chronicity:  Recurrent Context: activity, animal exposure, pollens, smoke exposure and weather changes   Context: not URI   Relieved by:  Nothing Worsened by:  Activity and weather changes Ineffective treatments:  Inhaler Associated symptoms: chest pain, cough and wheezing   Associated symptoms: no abdominal pain, no fever, no rash, no sore throat, no sputum production, no syncope and no vomiting   Cough:    Cough characteristics:  Non-productive   Severity:  Moderate Risk factors: tobacco use   Risk factors: no recent alcohol use, no family hx of DVT and no hx of PE/DVT     Past Medical History  Diagnosis Date  . Asthma    History reviewed. No pertinent past surgical history. No family history on file. History  Substance Use Topics  . Smoking status: Current Some Day Smoker  . Smokeless  tobacco: Never Used  . Alcohol Use: No    Review of Systems  Constitutional: Negative for fever, chills and appetite change.  HENT: Negative for sore throat.   Respiratory: Positive for cough, shortness of breath and wheezing. Negative for sputum production.   Cardiovascular: Positive for chest pain. Negative for syncope.  Gastrointestinal: Negative for vomiting and abdominal pain.  Skin: Negative for rash.      Allergies  Shellfish allergy  Home Medications   Prior to Admission medications   Medication Sig Start Date End Date Taking? Authorizing Provider  albuterol (PROVENTIL HFA;VENTOLIN HFA) 108 (90 BASE) MCG/ACT inhaler Inhale 2 puffs into the lungs every 4 (four) hours as needed for wheezing or shortness of breath. 01/04/14  Yes Kelly Humes, PA-C   BP 134/68  Pulse 89  Temp(Src) 97.5 F (36.4 C) (Axillary)  Resp 16  SpO2 100% Physical Exam  Constitutional: He is oriented to person, place, and time. He appears well-developed. No distress.  HENT:  Mouth/Throat: Oropharynx is clear and moist.  Eyes: EOM are normal.  Cardiovascular: Regular rhythm, normal heart sounds and intact distal pulses.   Tachycardia  Pulmonary/Chest: No respiratory distress.  Mild tachypnea without retractions; Diffuse inpiratory and expiratory wheezes bilaterally  Abdominal: Soft. There is no tenderness.  Musculoskeletal: He exhibits no edema.  Neurological: He is alert and oriented to person, place, and time.  Skin: Skin is warm. No rash noted. He is not diaphoretic.    ED Course  Procedures (including critical  care time) Labs Review Labs Reviewed  BASIC METABOLIC PANEL - Abnormal; Notable for the following:    Glucose, Bld 106 (*)    GFR calc non Af Amer 82 (*)    All other components within normal limits  CBC    Imaging Review Dg Chest Portable 1 View  02/16/2014   CLINICAL DATA:  Chest pain, shortness of breath  EXAM: PORTABLE CHEST - 1 VIEW  COMPARISON:  12/29/2013  FINDINGS:  The heart size and mediastinal contours are within normal limits. Both lungs are clear. The visualized skeletal structures are unremarkable.  IMPRESSION: No active disease.   Electronically Signed   By: Elige Ko   On: 02/16/2014 12:44     EKG Interpretation None      MDM   Final diagnoses:  Asthma, moderate persistent, with acute exacerbation   Patient presents with wheezing and shortness of breath consistent with asthma exacerbation.  This is the third ED visit for same complaint in the last month.  The patient reports using albuterol almost daily for the past month, but no control medications.  Unknown source for exacerbation: denies fevers, productive cough, new exposures.  Do to continued inspiratory and expiratory wheezing after 3 DuoNeb, steroids as a continuous albuterol will admit for acute asthma exacerbation and optimization of medications. Patient would also benefit from establishing PCP.     Jamal Collin, MD 02/16/14 [phone]

## 2014-02-16 NOTE — ED Notes (Signed)
Per [facility], pt hx of asthma, had an asthma flare up, started yesterday evening and today. Pt has been intubated before due to asthma. Wheezing throughout, diaphoretic, increased WOB, nonproductive cough. 5mg  albuteral, .5 atrovent given by ems, 18 LAC, 125 solumedrol, pt given another duoneb  Upon arrival to ED. 97% on RA. Denies pain, just tightness in chest due to attack.

## 2014-02-16 NOTE — ED Provider Notes (Signed)
I saw and evaluated the patient, reviewed the resident's note and I agree with the findings and plan.   EKG Interpretation None      Here for 3rd asthma exacerbation this month, feels like it's the worst. Minimal improvement after CAT. Admitted.  Elwin MochaBlair Wynema Garoutte, MD 02/16/14 [phone]

## 2014-02-16 NOTE — Progress Notes (Signed)
Admission note:  Arrival Method: Stretcher from ED Mental Orientation:A&O X4 Telemetry: TX01 Assessment: See doc flowsheet Skin: Dry,intact IV: L Ace Pain: Denies pain Tubes: N/A Fall Prevention Safety Plan: Reviewed with pt Admission Screening: to be completed 6700 Orientation: Patient has been oriented to the unit, staff and to the room.

## 2014-02-17 DIAGNOSIS — J45901 Unspecified asthma with (acute) exacerbation: Secondary | ICD-10-CM

## 2014-02-17 LAB — BASIC METABOLIC PANEL
ANION GAP: 14 (ref 5–15)
BUN: 12 mg/dL (ref 6–23)
CHLORIDE: 106 meq/L (ref 96–112)
CO2: 20 mEq/L (ref 19–32)
Calcium: 9.2 mg/dL (ref 8.4–10.5)
Creatinine, Ser: 0.96 mg/dL (ref 0.50–1.35)
Glucose, Bld: 115 mg/dL — ABNORMAL HIGH (ref 70–99)
POTASSIUM: 4.3 meq/L (ref 3.7–5.3)
SODIUM: 140 meq/L (ref 137–147)

## 2014-02-17 LAB — HIV ANTIBODY (ROUTINE TESTING W REFLEX): HIV: NONREACTIVE

## 2014-02-17 MED ORDER — MOMETASONE FURO-FORMOTEROL FUM 200-5 MCG/ACT IN AERO
2.0000 | INHALATION_SPRAY | Freq: Two times a day (BID) | RESPIRATORY_TRACT | Status: DC
  Administered 2014-02-18 (×2): 2 via RESPIRATORY_TRACT
  Filled 2014-02-17: qty 8.8

## 2014-02-17 NOTE — Care Management Note (Signed)
CARE MANAGEMENT NOTE 02/17/2014  Patient:  Jacob Ho,Jacob Ho   Account Number:  [id number]  Date Initiated:  02/17/2014  Documentation initiated by:  Norvin Ohlin  Subjective/Objective Assessment:   CM following for progression and d/c planning.     Action/Plan:   02/17/2014 Met with pt who has no PCP and no insurance, appointment scheduled @ [facility] for Monday, Feb 21, 2014 @ 9am, added to d/c info. [place] letter to assist with med in [place].   Anticipated DC Date:     Anticipated DC Plan:  [place]  Follow-up appt scheduled  MATCH Program  Medication Assistance      Choice offered to / List presented to:             Status of service:   Medicare Important Message given?   (If response is "NO", the following Medicare IM given date fields will be blank) Date Medicare IM given:   Medicare IM given by:   Date Additional Medicare IM given:   Additional Medicare IM given by:    Discharge Disposition:    Per UR Regulation:    If discussed at Long Length of Stay Meetings, dates discussed:    Comments:

## 2014-02-17 NOTE — Progress Notes (Signed)
UR completed 

## 2014-02-17 NOTE — Progress Notes (Signed)
Subjective:  Patient was seen and examined this morning. Patient states he is feeling better, but not back to baseline. He states he is at 60% (whereas he said on admission he was 40%).  He continues to have productive cough with clear phlegm and wheezes but his chest tightness and shortness of breath have improved. He denies fever, chest pain, nausea, vomiting or abdominal pain.   Objective: Vital signs in last 24 hours: Filed Vitals:   02/17/14 0542 02/17/14 0721 02/17/14 1000 02/17/14 1116  BP: 117/69  102/56   Pulse: 91  86   Temp: 98 F (36.7 C)  98.7 F (37.1 C)   TempSrc: Oral  Oral   Resp: 22  20   Height:      Weight:      SpO2: 98% 98% 99% 98%   Weight change:   Intake/Output Summary (Last 24 hours) at 02/17/14 1146 Last data filed at 02/17/14 0900  Gross per 24 hour  Intake    480 ml  Output      0 ml  Net    480 ml   Filed Vitals:   02/17/14 0542 02/17/14 0721 02/17/14 1000 02/17/14 1116  BP: 117/69  102/56   Pulse: 91  86   Temp: 98 F (36.7 C)  98.7 F (37.1 C)   TempSrc: Oral  Oral   Resp: 22  20   Height:      Weight:      SpO2: 98% 98% 99% 98%   General: Vital signs reviewed.  Patient is well-developed and well-nourished, in no acute distress and cooperative with exam.  Cardiovascular: Tachycardic, regular rhythm, S1 normal, S2 normal, no murmurs, gallops, or rubs. Pulmonary/Chest: Expiratory wheezes bilaterally, improved, no rales, or rhonchi. No respiratory distress, no accessory muscle use. Abdominal: Soft, non-tender, non-distended, BS +, no masses, organomegaly, or guarding present.  Musculoskeletal: No joint deformities, erythema, or stiffness, ROM full and nontender. Extremities: No lower extremity edema bilaterally,  pulses symmetric and intact bilaterally. No cyanosis or clubbing. Skin: Warm, dry and intact. No rashes or erythema. Psychiatric: Normal mood and affect. speech and behavior is normal. Cognition and memory are normal.   Lab  Results: Basic Metabolic Panel:  Recent Labs Lab 02/16/14 1229 02/17/14 0535  NA 145 140  K 3.9 4.3  CL 109 106  CO2 25 20  GLUCOSE 106* 115*  BUN 9 12  CREATININE 1.10 0.96  CALCIUM 8.7 9.2   CBC:  Recent Labs Lab 02/16/14 1229  WBC 8.9  HGB 16.3  HCT 47.1  MCV 84.9  PLT 203    Studies/Results: Dg Chest Portable 1 View  02/16/2014   CLINICAL DATA:  Chest pain, shortness of breath  EXAM: PORTABLE CHEST - 1 VIEW  COMPARISON:  12/29/2013  FINDINGS: The heart size and mediastinal contours are within normal limits. Both lungs are clear. The visualized skeletal structures are unremarkable.  IMPRESSION: No active disease.   Electronically Signed   By: Elige KoHetal  Patel   On: 02/16/2014 12:44   Medications:  I have reviewed the patient's current medications. Prior to Admission:  Prescriptions prior to admission  Medication Sig Dispense Refill  . albuterol (PROVENTIL HFA;VENTOLIN HFA) 108 (90 BASE) MCG/ACT inhaler Inhale 2 puffs into the lungs every 4 (four) hours as needed for wheezing or shortness of breath.  1 Inhaler  0   Scheduled Meds: . enoxaparin (LOVENOX) injection  40 mg Subcutaneous Q24H  . ipratropium-albuterol  3 mL Nebulization Q4H  . loratadine  10 mg Oral Daily  . mometasone-formoterol  2 puff Inhalation BID  . predniSONE  60 mg Oral Q breakfast  . sodium chloride  3 mL Intravenous Q12H   Continuous Infusions:  PRN Meds:.acetaminophen, acetaminophen, albuterol Assessment/Plan: Active Problems:   Asthma exacerbation  Asthma Exacerbation: Patient is improving, but continues to have cough and expiratory wheezing. Patient improving with albuterol and ipratropium nebulizers and prednisone. We will continues with current treatment and add a LABA + inhaled CS. Likely d/c tomorrow. -Prednisone 60 mg daily, will do a steroid taper on discharge (will need two more days of 60 mg, then taper) -Dulera (mometasone-formoterol 200-50) 2 puffs BID -Ipratropium and Albuterol  nebulizer Q4H  -Albuterol nebulizer Q2H prn wheezing  -Regular diet  -Oxygen supplementation as needed  -Peak expiratory flow rate for asthma monitoring   DVT/PE ppx: Lovenox 40 mg SQ daily  Dispo: Disposition is deferred at this time, awaiting improvement of current medical problems.  Anticipated discharge in approximately 1-2 day(s).   The patient does not have a current PCP (No Pcp Per Patient) and does need an [facility] hospital follow-up appointment after discharge.  The patient does not have transportation limitations that hinder transportation to clinic appointments.  .Services Needed at time of discharge: Y = Yes, Blank = No PT:   OT:   RN:   Equipment:   Other:     LOS: 1 day   Jill Alexanders, DO PGY-1 Internal Medicine Resident Pager # [phone] 02/17/2014 11:46 AM

## 2014-02-17 NOTE — H&P (Signed)
Attending physician admitting note: I personally interviewed and examined this patient. Medical history, presenting problems, physical findings, medications, and imaging studies reviewed. Findings, evaluation and management plan are accurate as reported by resident physician Dr. Jill AlexandersAlexa Richardson.  Clinical summary: 38 year old man with intrinsic asthma taking minimal medications at home limited to albuterol. He presents with an acute asthma attack. No obvious precipitating factors. No signs of acute infection. He has had a cough productive of clear sputum for about 48 hours. No fever. He does not have a primary care physician. He ran out of his albuterol.  On initial exam he was tachycardic, there were diffuse expiratory wheezes over both lungs, he was not in respiratory distress and not using accessory muscles. A portable chest x-ray was unremarkable for any infiltrate or effusion. A CBC shows no elevation of his white count.  His condition has improved significantly overnight. He feels he is about 80% back to his baseline. He still has diffuse expiratory wheezing on exam this morning. Blood pressure 102/56, pulse 86, temperature 98.7 F (37.1 C), temperature source Oral, resp. rate 20, height 5\' 9"  (1.753 m), weight 185 lb (83.915 kg), SpO2 98.00%.  Impression: Mild asthma attack  Plan: Steroids will be added to his regimen acutely along with albuterol and ipratropium by nebulizer. We will try to get him established in our Gen. medicine clinic. We anticipate adding inhaled steroids to his bronchodilators for long-term use.  Cephas DarbyJames Nell Gales, MD, FACP  Hematology-Oncology/Internal Medicine

## 2014-02-17 NOTE — Progress Notes (Signed)
Attending physician note: I personally examined this patient this morning along with resident physician Dr. Jill AlexandersAlexa Richardson and I concur with her evaluation and management plan. Cephas DarbyJames Gerrod Maule, M.D., FACP

## 2014-02-18 MED ORDER — LORATADINE 10 MG PO TABS: 10.0000 mg | ORAL_TABLET | Freq: Every day | ORAL | Status: DC

## 2014-02-18 MED ORDER — MOMETASONE FURO-FORMOTEROL FUM 200-5 MCG/ACT IN AERO
2.0000 | INHALATION_SPRAY | Freq: Two times a day (BID) | RESPIRATORY_TRACT | Status: DC
Start: 2014-02-18 — End: 2017-07-09

## 2014-02-18 MED ORDER — PREDNISONE 20 MG PO TABS: 20.0000 mg | ORAL_TABLET | Freq: Every day | ORAL | Status: DC

## 2014-02-18 MED ORDER — ALBUTEROL SULFATE HFA 108 (90 BASE) MCG/ACT IN AERS: 2.0000 | INHALATION_SPRAY | RESPIRATORY_TRACT | Status: DC | PRN

## 2014-02-18 NOTE — Progress Notes (Signed)
Medicine attending: I personally interviewed and examined this patient on the day of discharge together with resident physician Dr. Jill AlexandersAlexa Richardson and I concur with her evaluation and discharge plan. 38 year old man with known intrinsic asthma complicated by intermittent cigarette smoking who presented with an acute asthma exacerbation with no obvious precipitating cause. He had a low grade cough productive of clear sputum. No fever. No pharyngitis. He responded promptly to oxygen, steroids, and bronchodilators. He has residual expiratory wheezing on exam today but is moving air well with good oxygen saturations on room air. He has not had regular medical followup. We will get him established with our internal medicine clinic. We feel he would benefit with the addition of inhaled steroids to his regimen. He is stable for discharge today.  Jacob DarbyJames Lyniah Fujita, MD, FACP  Hematology-Oncology/Internal Medicine

## 2014-02-18 NOTE — Progress Notes (Signed)
Discharge papers and instructions given to patient.Match medication assistance card also given to patient.SW to send cab voucher as patient's spouse unable to come pick up patient.Patient stable. Stefanee Mckell, Drinda Buttsharito Joselita, RCharity fundraiser

## 2014-02-18 NOTE — Progress Notes (Signed)
02/18/14 22:15 Patient discharged home.Patient is aware that prescription for prednisone was sent by MD to [place] [facility] [place]. Cadey Bazile, Drinda Buttsharito Joselita, RCharity fundraiser

## 2014-02-18 NOTE — Discharge Summary (Signed)
Name: Jacob Ho MRN: [id number] DOB: 09/03/1975 38 y.o. PCP: No Pcp Per Patient  Date of Admission: 02/16/2014 11:42 AM Date of Discharge: 02/18/2014 Attending Physician: Levert FeinsteinJames M Granfortuna, MD  Discharge Diagnosis:  Active Problems:   Asthma exacerbation  Discharge Medications:   Medication List         albuterol 108 (90 BASE) MCG/ACT inhaler  Commonly known as:  PROVENTIL HFA;VENTOLIN HFA  Inhale 2 puffs into the lungs every 4 (four) hours as needed for wheezing or shortness of breath.     loratadine 10 MG tablet  Commonly known as:  CLARITIN  Take 1 tablet (10 mg total) by mouth daily.     mometasone-formoterol 200-5 MCG/ACT Aero  Commonly known as:  DULERA  Inhale 2 puffs into the lungs 2 (two) times daily.     predniSONE 20 MG tablet  Commonly known as:  DELTASONE  Take 1 tablet (20 mg total) by mouth daily with breakfast. Take 3 tablets on day 1 (02/19/14). Take 2 tablets on days 2 through 5 (02/20/14-02/23/14). Take 1 tablet days 6-9 (02/24/14-02/27/14).        Disposition and follow-up:   [facility].  At the hospital follow up visit please address:  1.  Asthma: Please address resolution of wheezing. Please address control on Dulera and albuterol and completion of prednisone.  Tobacco Use: Please address cessation.  2.  Labs / imaging needed at time of follow-up: None  3.  Pending labs/ test needing follow-up: None  Follow-up Appointments: Follow-up Information   Follow up with Genelle GatherGlenn, Kathryn F, MD On 03/02/2014. (Will see Rudell Cobbeborah Hill at 9:00 am to set you up with [facility] [phone]). And then you will see the doctor afterward at 10:45 am.)    Specialty:  Internal Medicine   Contact information:   [address] [place] [address] [phone]      Procedures Performed:  Dg Chest Portable 1 View  02/16/2014   CLINICAL DATA:  Chest pain, shortness of breath  EXAM:  PORTABLE CHEST - 1 VIEW  COMPARISON:  12/29/2013  FINDINGS: The heart size and mediastinal contours are within normal limits. Both lungs are clear. The visualized skeletal structures are unremarkable.  IMPRESSION: No active disease.   Electronically Signed   By: Elige KoHetal  Patel   On: 02/16/2014 12:44   Admission HPI:   Mr. Jacob ProctorHemphill is a 38 yo male with PMHx of asthma who presents to the ED with complaint of shortness of breath and wheezing. Patient states that for the past two days he has had shortness of breath, wheezing, chest tightness, productive cough with clear phlegm, chills, and diaphoresis. He has been trying to manage his symptoms at home and by using his Ventolin inhaler, but it was not helping and he ran out of his ventolin inhaler. Patient states for the past 1.5 months he has had worsening asthma and several exacerbations that he has had to come to the ED for, but had not been admitted. He admits to waking up nightly for the past one month with shortness of breath requiring albuterol inhaler. He uses his albuterol inhaler about 5 times per day. Previously, his symptoms were controlled for over 10 years and had just worsened in the last couple months. He attributes this to the change in the weather and the season. He has a history of childhood asthma and had to be intubated when he was 89 or[age] years old.  Patient thinks his current exacerbation is due to weather and season change. He does smoke, but has decreased to 1 black and mild cigarette a day. He was previously smoking for 22 years but quit 16 months ago. He does smoke marijuana every couple of months. He denies alcohol or other drug use.  Social History: Sexually active with one male partner. Has 2 kids ages 38 and 70. Works for a [employer].  Family Hx: Mother is a diabetic and also has asthma. Father is deceased and past away from an MI in 16. Brother has HTN and asthma.  Hospital Course by problem list: Active Problems:   Asthma  exacerbation   Asthma Exacerbation: Patient presented with dyspnea, chest tightness and wheezing worsening over 2 days. Patient has a history of asthma since childhood. He has been on many medications in the past but was currently only on albuterol inhaler that did not control his symptoms well. Patient has severe persistent asthma based on his frequent use of albuterol, nightly awakenings, and daily symptoms (see HPI). We are unsure of his lung function. Patient should be on medium dose of inhaled glucocorticoids and a long acting beta agonist and albuterol prn at home, but is not. Patient was afebrile at 97.5, normotensive, RR 15, saturating 98% on room air on admission. Labs were within normal limits, no leukocytosis. CXR showed no active disease. Patient had diffuse expiratory wheezing on exam. Over the course of his stay, patient received Duoneb treatments every 4 hours and prednisone 60 mg daily. We also started him on Dulera 2 puffs BID. Patient's wheezing improved, but never completely resolved. However, he was never in any respiratory distress, nor did he require oxygen. We discharge the patient on prednisone taper, Dulera, and refilled his albuterol inhaler prescription. We gave the patient a peak expiratory flow rate monitor for asthma monitoring. At follow up, please address how the patient's asthma is controlled on these medications.  Discharge Vitals:   BP 123/69  Pulse 78  Temp(Src) 98 F (36.7 C) (Oral)  Resp 18  Ht 5\' 9"  (1.753 m)  Wt 83.28 kg (183 lb 9.6 oz)  BMI 27.10 kg/m2  SpO2 100%  Signed: Jill Alexanders, DO PGY-1 Internal Medicine Resident Pager # [phone] 02/18/2014 4:52 PM

## 2014-02-18 NOTE — Progress Notes (Addendum)
Subjective:  Patient was seen and examined this morning. Patient is improving, but doesn't feel back at baseline. He does not normally have wheezing at baseline and wishes to be 100% better before discharge. He does believe the new medication is working well. He also inquires about disability about his asthma. Patient denies fever, chills, cough, chest pain, shortness of breath, nausea or vomiting.  After talking with the patient further, he is agreeable with discharge to home today.  Objective: Vital signs in last 24 hours: Filed Vitals:   02/18/14 0037 02/18/14 0453 02/18/14 0500 02/18/14 0652  BP:   111/78   Pulse:   69   Temp:   98 F (36.7 C)   TempSrc:   Oral   Resp:   18   Height:      Weight:      SpO2: 97% 99% 99% 97%   Weight change: -0.635 kg (-1 lb 6.4 oz)  Intake/Output Summary (Last 24 hours) at 02/18/14 0819 Last data filed at 02/18/14 [id number]  Gross per 24 hour  Intake    480 ml  Output      0 ml  Net    480 ml   General: Vital signs reviewed.  Patient is well-developed and well-nourished, in no acute distress and cooperative with exam.  Cardiovascular: RRR, S1 normal, S2 normal, no murmurs, gallops, or rubs. Pulmonary/Chest: Expiratory wheezes bilaterally, improved, no rales, or rhonchi. No respiratory distress, no accessory muscle use. Abdominal: Soft, non-tender, non-distended, BS +, no masses, organomegaly, or guarding present.  Musculoskeletal: No joint deformities, erythema, or stiffness, ROM full and nontender. Extremities: No lower extremity edema bilaterally,  pulses symmetric and intact bilaterally. No cyanosis or clubbing. Skin: Warm, dry and intact. No rashes or erythema. Psychiatric: Normal mood and affect. speech and behavior is normal. Cognition and memory are normal.   Lab Results: Basic Metabolic Panel:  Recent Labs Lab 02/16/14 1229 02/17/14 0535  NA 145 140  K 3.9 4.3  CL 109 106  CO2 25 20  GLUCOSE 106* 115*  BUN 9 12  CREATININE  1.10 0.96  CALCIUM 8.7 9.2   CBC:  Recent Labs Lab 02/16/14 1229  WBC 8.9  HGB 16.3  HCT 47.1  MCV 84.9  PLT 203    Studies/Results: Dg Chest Portable 1 View  02/16/2014   CLINICAL DATA:  Chest pain, shortness of breath  EXAM: PORTABLE CHEST - 1 VIEW  COMPARISON:  12/29/2013  FINDINGS: The heart size and mediastinal contours are within normal limits. Both lungs are clear. The visualized skeletal structures are unremarkable.  IMPRESSION: No active disease.   Electronically Signed   By: Elige KoHetal  Patel   On: 02/16/2014 12:44   Medications:  I have reviewed the patient's current medications. Prior to Admission:  Prescriptions prior to admission  Medication Sig Dispense Refill  . albuterol (PROVENTIL HFA;VENTOLIN HFA) 108 (90 BASE) MCG/ACT inhaler Inhale 2 puffs into the lungs every 4 (four) hours as needed for wheezing or shortness of breath.  1 Inhaler  0   Scheduled Meds: . enoxaparin (LOVENOX) injection  40 mg Subcutaneous Q24H  . ipratropium-albuterol  3 mL Nebulization Q4H  . loratadine  10 mg Oral Daily  . mometasone-formoterol  2 puff Inhalation BID  . predniSONE  60 mg Oral Q breakfast  . sodium chloride  3 mL Intravenous Q12H   Continuous Infusions:  PRN Meds:.acetaminophen, acetaminophen, albuterol Assessment/Plan: Active Problems:   Asthma exacerbation  Asthma Exacerbation: Patient is improving, but not back  at baseline. He continues to have expiratory wheezes, but no respiratory distress. Patient is saturating 99% on room air. We added Dulera this morning and will continue Duoneb treatments until discharge. Patient will need a refill on albuterol inhaler and steroid taper. Patient is agreeable to discharge. -Prednisone 60 mg daily, will do a steroid taper on discharge (will need one more day of 60 mg, then taper) -Dulera (mometasone-formoterol 200-50) 2 puffs BID -Ipratropium and Albuterol nebulizer Q4H  -Albuterol nebulizer Q2H prn wheezing  -Regular diet  -Oxygen  supplementation as needed  -Peak expiratory flow rate for asthma monitoring   DVT/PE ppx: Lovenox 40 mg SQ daily  Dispo: Disposition is deferred at this time, awaiting improvement of current medical problems.  Anticipated discharge in approximately 1-2 day(s).   The patient does not have a current PCP (No Pcp Per Patient) and does need an [facility] hospital follow-up appointment after discharge.  The patient does not have transportation limitations that hinder transportation to clinic appointments.  .Services Needed at time of discharge: Y = Yes, Blank = No PT:   OT:   RN:   Equipment:   Other:     LOS: 2 days   Jill Alexanders, DO PGY-1 Internal Medicine Resident Pager # [phone] 02/18/2014 8:19 AM

## 2014-02-18 NOTE — Discharge Instructions (Signed)
Thank you for allowing [place] to be involved in your healthcare while you were hospitalized at [facility].   Please note that there have been changes to your home medications.  --> PLEASE LOOK AT YOUR DISCHARGE MEDICATION LIST FOR DETAILS.  Please call your PCP if you have any questions or concerns, or any difficulty getting any of your medications.  Please return to the ER if you have worsening of your symptoms or new severe symptoms arise.   Please take your new inhaler (DULERA) two puffs twice a day. I have also reordered you a prescription for ALBUTEROL INHALER. Take this as needed for wheezing or shortness of breath.   Please finish your course of Prednisone as follows: Take 3 tablets on day 1 (02/19/14).  Take 2 tablets on days 2 through 5 (02/20/14-02/23/14).  Take 1 tablet on days 6-9 (02/24/14-02/27/14).   For seasonal allergies, I have prescribed you Claritin 10 mg daily to help prevent asthma attacks.  Please attend your appointment on 03/02/14 at 9:00 am. You will meet with Rudell Cobb for financial counseling first and then meet with your doctor at 10:45 am. The [facility] Clinic is located here at the hospital on the Ground Floor [facility] Level).   Asthma Attack Prevention Although there is no way to prevent asthma from starting, you can take steps to control the disease and reduce its symptoms. Learn about your asthma and how to control it. Take an active role to control your asthma by working with your health care provider to create and follow an asthma action plan. An asthma action plan guides you in:  Taking your medicines properly.  Avoiding things that set off your asthma or make your asthma worse (asthma triggers).  Tracking your level of asthma control.  Responding to worsening asthma.  Seeking emergency care when needed. To track your asthma, keep records of your symptoms, check your peak flow number using a handheld device that shows how  well air moves out of your lungs (peak flow meter), and get regular asthma checkups.  WHAT ARE SOME WAYS TO PREVENT AN ASTHMA ATTACK?  Take medicines as directed by your health care provider.  Keep track of your asthma symptoms and level of control.  With your health care provider, write a detailed plan for taking medicines and managing an asthma attack. Then be sure to follow your action plan. Asthma is an ongoing condition that needs regular monitoring and treatment.  Identify and avoid asthma triggers. Many outdoor allergens and irritants (such as pollen, mold, cold air, and air pollution) can trigger asthma attacks. Find out what your asthma triggers are and take steps to avoid them.  Monitor your breathing. Learn to recognize warning signs of an attack, such as coughing, wheezing, or shortness of breath. Your lung function may decrease before you notice any signs or symptoms, so regularly measure and record your peak airflow with a home peak flow meter.  Identify and treat attacks early. If you act quickly, you are less likely to have a severe attack. You will also need less medicine to control your symptoms. When your peak flow measurements decrease and alert you to an upcoming attack, take your medicine as instructed and immediately stop any activity that may have triggered the attack. If your symptoms do not improve, get medical help.  Pay attention to increasing quick-relief inhaler use. If you find yourself relying on your quick-relief inhaler, your asthma is not under control. See your health care  provider about adjusting your treatment. WHAT CAN MAKE MY SYMPTOMS WORSE? A number of common things can set off or make your asthma symptoms worse and cause temporary increased inflammation of your airways. Keep track of your asthma symptoms for several weeks, detailing all the environmental and emotional factors that are linked with your asthma. When you have an asthma attack, go back to your  asthma diary to see which factor, or combination of factors, might have contributed to it. Once you know what these factors are, you can take steps to control many of them. If you have allergies and asthma, it is important to take asthma prevention steps at home. Minimizing contact with the substance to which you are allergic will help prevent an asthma attack. Some triggers and ways to avoid these triggers are: Animal Dander:  Some people are allergic to the flakes of skin or dried saliva from animals with fur or feathers.   There is no such thing as a hypoallergenic dog or cat breed. All dogs or cats can cause allergies, even if they don't shed.  Keep these pets out of your home.  If you are not able to keep a pet outdoors, keep the pet out of your bedroom and other sleeping areas at all times, and keep the door closed.  Remove carpets and furniture covered with cloth from your home. If that is not possible, keep the pet away from fabric-covered furniture and carpets. Dust Mites: Many people with asthma are allergic to dust mites. Dust mites are tiny bugs that are found in every home in mattresses, pillows, carpets, fabric-covered furniture, bedcovers, clothes, stuffed toys, and other fabric-covered items.   Cover your mattress in a special dust-proof cover.  Cover your pillow in a special dust-proof cover, or wash the pillow each week in hot water. Water must be hotter than 130 F (54.4 C) to kill dust mites. Cold or warm water used with detergent and bleach can also be effective.  Wash the sheets and blankets on your bed each week in hot water.  Try not to sleep or lie on cloth-covered cushions.  Call ahead when traveling and ask for a smoke-free hotel room. Bring your own bedding and pillows in case the hotel only supplies feather pillows and down comforters, which may contain dust mites and cause asthma symptoms.  Remove carpets from your bedroom and those laid on concrete, if you  can.  Keep stuffed toys out of the bed, or wash the toys weekly in hot water or cooler water with detergent and bleach. Cockroaches: Many people with asthma are allergic to the droppings and remains of cockroaches.   Keep food and garbage in closed containers. Never leave food out.  Use poison baits, traps, powders, gels, or paste (for example, boric acid).  If a spray is used to kill cockroaches, stay out of the room until the odor goes away. Indoor Mold:  Fix leaky faucets, pipes, or other sources of water that have mold around them.  Clean floors and moldy surfaces with a fungicide or diluted bleach.  Avoid using humidifiers, vaporizers, or swamp coolers. These can spread molds through the air. Pollen and Outdoor Mold:  When pollen or mold spore counts are high, try to keep your windows closed.  Stay indoors with windows closed from late morning to afternoon. Pollen and some mold spore counts are highest at that time.  Ask your health care provider whether you need to take anti-inflammatory medicine or increase your dose of  the medicine before your allergy season starts. Other Irritants to Avoid:  Tobacco smoke is an irritant. If you smoke, ask your health care provider how you can quit. Ask family members to quit smoking, too. Do not allow smoking in your home or car.  If possible, do not use a wood-burning stove, kerosene heater, or fireplace. Minimize exposure to all sources of smoke, including incense, candles, fires, and fireworks.  Try to stay away from strong odors and sprays, such as perfume, talcum powder, hair spray, and paints.  Decrease humidity in your home and use an indoor air cleaning device. Reduce indoor humidity to below 60%. Dehumidifiers or central air conditioners can do this.  Decrease house dust exposure by changing furnace and air cooler filters frequently.  Try to have someone else vacuum for you once or twice a week. Stay out of rooms while they are  being vacuumed and for a short while afterward.  If you vacuum, use a dust mask from a hardware store, a double-layered or microfilter vacuum cleaner bag, or a vacuum cleaner with a HEPA filter.  Sulfites in foods and beverages can be irritants. Do not drink beer or wine or eat dried fruit, processed potatoes, or shrimp if they cause asthma symptoms.  Cold air can trigger an asthma attack. Cover your nose and mouth with a scarf on cold or windy days.  Several health conditions can make asthma more difficult to manage, including a runny nose, sinus infections, reflux disease, psychological stress, and sleep apnea. Work with your health care provider to manage these conditions.  Avoid close contact with people who have a respiratory infection such as a cold or the flu, since your asthma symptoms may get worse if you catch the infection. Wash your hands thoroughly after touching items that may have been handled by people with a respiratory infection.  Get a flu shot every year to protect against the flu virus, which often makes asthma worse for days or weeks. Also get a pneumonia shot if you have not previously had one. Unlike the flu shot, the pneumonia shot does not need to be given yearly. Medicines:  Talk to your health care provider about whether it is safe for you to take aspirin or non-steroidal anti-inflammatory medicines (NSAIDs). In a small number of people with asthma, aspirin and NSAIDs can cause asthma attacks. These medicines must be avoided by people who have known aspirin-sensitive asthma. It is important that people with aspirin-sensitive asthma read labels of all over-the-counter medicines used to treat pain, colds, coughs, and fever.  Beta-blockers and ACE inhibitors are other medicines you should discuss with your health care provider. HOW CAN I FIND OUT WHAT I AM ALLERGIC TO? Ask your asthma health care provider about allergy skin testing or blood testing (the RAST test) to  identify the allergens to which you are sensitive. If you are found to have allergies, the most important thing to do is to try to avoid exposure to any allergens that you are sensitive to as much as possible. Other treatments for allergies, such as medicines and allergy shots (immunotherapy) are available.  CAN I EXERCISE? Follow your health care provider's advice regarding asthma treatment before exercising. It is important to maintain a regular exercise program, but vigorous exercise or exercise in cold, humid, or dry environments can cause asthma attacks, especially for those people who have exercise-induced asthma. Document Released: 04/24/2009 Document Revised: 05/11/2013 Document Reviewed: 11/11/2012 [facility] Patient Information 2015 [place], [place]. This information is not  intended to replace advice given to you by your health care provider. Make sure you discuss any questions you have with your health care provider.   Asthma Asthma is a recurring condition in which the airways tighten and narrow. Asthma can make it difficult to breathe. It can cause coughing, wheezing, and shortness of breath. Asthma episodes, also called asthma attacks, range from minor to life-threatening. Asthma cannot be cured, but medicines and lifestyle changes can help control it. CAUSES Asthma is believed to be caused by inherited (genetic) and environmental factors, but its exact cause is unknown. Asthma may be triggered by allergens, lung infections, or irritants in the air. Asthma triggers are different for each person. Common triggers include:   Animal dander.  Dust mites.  Cockroaches.  Pollen from trees or grass.  Mold.  Smoke.  Air pollutants such as dust, household cleaners, hair sprays, aerosol sprays, paint fumes, strong chemicals, or strong odors.  Cold air, weather changes, and winds (which increase molds and pollens in the air).  Strong emotional expressions such as crying or laughing  hard.  Stress.  Certain medicines (such as aspirin) or types of drugs (such as beta-blockers).  Sulfites in foods and drinks. Foods and drinks that may contain sulfites include dried fruit, potato chips, and sparkling grape juice.  Infections or inflammatory conditions such as the flu, a cold, or an inflammation of the nasal membranes (rhinitis).  Gastroesophageal reflux disease (GERD).  Exercise or strenuous activity. SYMPTOMS Symptoms may occur immediately after asthma is triggered or many hours later. Symptoms include:  Wheezing.  Excessive nighttime or early morning coughing.  Frequent or severe coughing with a common cold.  Chest tightness.  Shortness of breath. DIAGNOSIS  The diagnosis of asthma is made by a review of your medical history and a physical exam. Tests may also be performed. These may include:  Lung function studies. These tests show how much air you breathe in and out.  Allergy tests.  Imaging tests such as X-rays. TREATMENT  Asthma cannot be cured, but it can usually be controlled. Treatment involves identifying and avoiding your asthma triggers. It also involves medicines. There are 2 classes of medicine used for asthma treatment:   Controller medicines. These prevent asthma symptoms from occurring. They are usually taken every day.  Reliever or rescue medicines. These quickly relieve asthma symptoms. They are used as needed and provide short-term relief. Your health care provider will help you create an asthma action plan. An asthma action plan is a written plan for managing and treating your asthma attacks. It includes a list of your asthma triggers and how they may be avoided. It also includes information on when medicines should be taken and when their dosage should be changed. An action plan may also involve the use of a device called a peak flow meter. A peak flow meter measures how well the lungs are working. It helps you monitor your  condition. HOME CARE INSTRUCTIONS   Take medicines only as directed by your health care provider. Speak with your health care provider if you have questions about how or when to take the medicines.  Use a peak flow meter as directed by your health care provider. Record and keep track of readings.  Understand and use the action plan to help minimize or stop an asthma attack without needing to seek medical care.  Control your home environment in the following ways to help prevent asthma attacks:  Do not smoke. Avoid being exposed to secondhand  smoke.  Change your heating and air conditioning filter regularly.  Limit your use of fireplaces and wood stoves.  Get rid of pests (such as roaches and mice) and their droppings.  Throw away plants if you see mold on them.  Clean your floors and dust regularly. Use unscented cleaning products.  Try to have someone else vacuum for you regularly. Stay out of rooms while they are being vacuumed and for a short while afterward. If you vacuum, use a dust mask from a hardware store, a double-layered or microfilter vacuum cleaner bag, or a vacuum cleaner with a HEPA filter.  Replace carpet with wood, tile, or vinyl flooring. Carpet can trap dander and dust.  Use allergy-proof pillows, mattress covers, and box spring covers.  Wash bed sheets and blankets every week in hot water and dry them in a dryer.  Use blankets that are made of polyester or cotton.  Clean bathrooms and kitchens with bleach. If possible, have someone repaint the walls in these rooms with mold-resistant paint. Keep out of the rooms that are being cleaned and painted.  Wash hands frequently. SEEK MEDICAL CARE IF:   You have wheezing, shortness of breath, or a cough even if taking medicine to prevent attacks.  The colored mucus you cough up (sputum) is thicker than usual.  Your sputum changes from clear or white to yellow, green, gray, or bloody.  You have any problems that  may be related to the medicines you are taking (such as a rash, itching, swelling, or trouble breathing).  You are using a reliever medicine more than 2-3 times per week.  Your peak flow is still at 50-79% of your personal best after following your action plan for 1 hour.  You have a fever. SEEK IMMEDIATE MEDICAL CARE IF:   You seem to be getting worse and are unresponsive to treatment during an asthma attack.  You are short of breath even at rest.  You get short of breath when doing very little physical activity.  You have difficulty eating, drinking, or talking due to asthma symptoms.  You develop chest pain.  You develop a fast heartbeat.  You have a bluish color to your lips or fingernails.  You are light-headed, dizzy, or faint.  Your peak flow is less than 50% of your personal best. MAKE SURE YOU:   Understand these instructions.  Will watch your condition.  Will get help right away if you are not doing well or get worse. Document Released: 05/06/2005 Document Revised: 09/20/2013 Document Reviewed: 12/03/2012 [facility] Patient Information 2015 [place], [place]. This information is not intended to replace advice given to you by your health care provider. Make sure you discuss any questions you have with your health care provider.

## 2014-03-02 ENCOUNTER — Ambulatory Visit: Payer: Self-pay

## 2014-03-02 ENCOUNTER — Ambulatory Visit: Payer: Self-pay | Admitting: Internal Medicine

## 2014-03-02 ENCOUNTER — Encounter: Payer: Self-pay | Admitting: General Practice

## 2014-04-11 ENCOUNTER — Encounter (HOSPITAL_COMMUNITY): Payer: Self-pay | Admitting: *Deleted

## 2014-04-11 ENCOUNTER — Inpatient Hospital Stay (HOSPITAL_COMMUNITY)
Admission: EM | Admit: 2014-04-11 | Discharge: 2014-04-13 | DRG: 203 | Disposition: A | Discharge status: Home / Self Care | Payer: MEDICAID | Attending: Internal Medicine | Admitting: Internal Medicine

## 2014-04-11 ENCOUNTER — Emergency Department (HOSPITAL_COMMUNITY): Payer: MEDICAID

## 2014-04-11 DIAGNOSIS — J209 Acute bronchitis, unspecified: Secondary | ICD-10-CM | POA: Diagnosis present

## 2014-04-11 DIAGNOSIS — Z91013 Allergy to seafood: Secondary | ICD-10-CM

## 2014-04-11 DIAGNOSIS — Z87891 Personal history of nicotine dependence: Secondary | ICD-10-CM

## 2014-04-11 DIAGNOSIS — J45901 Unspecified asthma with (acute) exacerbation: Principal | ICD-10-CM | POA: Diagnosis present

## 2014-04-11 DIAGNOSIS — J9601 Acute respiratory failure with hypoxia: Secondary | ICD-10-CM | POA: Diagnosis present

## 2014-04-11 DIAGNOSIS — J45909 Unspecified asthma, uncomplicated: Secondary | ICD-10-CM

## 2014-04-11 LAB — BASIC METABOLIC PANEL
Anion gap: 15 (ref 5–15)
BUN: 8 mg/dL (ref 6–23)
CALCIUM: 8.7 mg/dL (ref 8.4–10.5)
CHLORIDE: 107 meq/L (ref 96–112)
CO2: 21 mEq/L (ref 19–32)
Creatinine, Ser: 1.11 mg/dL (ref 0.50–1.35)
GFR calc Af Amer: >90 mL/min (ref >90)
GFR calc non Af Amer: 82 mL/min — ABNORMAL LOW (ref >90)
GLUCOSE: 106 mg/dL — AB (ref 70–99)
Potassium: 3.7 mEq/L (ref 3.7–5.3)
Sodium: 143 mEq/L (ref 137–147)

## 2014-04-11 LAB — CBC WITH DIFFERENTIAL/PLATELET
Basophils Absolute: 0 10*3/uL (ref 0.0–0.1)
Basophils Relative: 0 % (ref 0–1)
EOS PCT: 2 % (ref 0–5)
Eosinophils Absolute: 0.2 10*3/uL (ref 0.0–0.7)
HEMATOCRIT: 46.4 % (ref 39.0–52.0)
Hemoglobin: 16 g/dL (ref 13.0–17.0)
LYMPHS ABS: 2.7 10*3/uL (ref 0.7–4.0)
LYMPHS PCT: 31 % (ref 12–46)
MCH: 29.7 pg (ref 26.0–34.0)
MCHC: 34.5 g/dL (ref 30.0–36.0)
MCV: 86.1 fL (ref 78.0–100.0)
MONO ABS: 0.7 10*3/uL (ref 0.1–1.0)
Monocytes Relative: 8 % (ref 3–12)
NEUTROS ABS: 5.1 10*3/uL (ref 1.7–7.7)
Neutrophils Relative %: 59 % (ref 43–77)
Platelets: 176 10*3/uL (ref 150–400)
RBC: 5.39 MIL/uL (ref 4.22–5.81)
RDW: 13.7 % (ref 11.5–15.5)
WBC: 8.6 10*3/uL (ref 4.0–10.5)

## 2014-04-11 LAB — INFLUENZA PANEL BY PCR (TYPE A & B)
H1N1 flu by pcr: NOT DETECTED
INFLAPCR: NEGATIVE
INFLBPCR: NEGATIVE

## 2014-04-11 LAB — STREP PNEUMONIAE URINARY ANTIGEN: Strep Pneumo Urinary Antigen: NEGATIVE

## 2014-04-11 MED ORDER — CEFTRIAXONE SODIUM IN DEXTROSE 20 MG/ML IV SOLN
1.0000 g | INTRAVENOUS | Status: DC
  Administered 2014-04-11: 1 g via INTRAVENOUS
  Filled 2014-04-11: qty 50

## 2014-04-11 MED ORDER — ONDANSETRON HCL 4 MG/2ML IJ SOLN: 4.0000 mg | Freq: Four times a day (QID) | INTRAMUSCULAR | Status: DC | PRN

## 2014-04-11 MED ORDER — MOMETASONE FURO-FORMOTEROL FUM 200-5 MCG/ACT IN AERO
2.0000 | INHALATION_SPRAY | Freq: Two times a day (BID) | RESPIRATORY_TRACT | Status: DC
  Administered 2014-04-11 – 2014-04-13 (×5): 2 via RESPIRATORY_TRACT
  Filled 2014-04-11: qty 8.8

## 2014-04-11 MED ORDER — ONDANSETRON HCL 4 MG PO TABS: 4.0000 mg | ORAL_TABLET | Freq: Four times a day (QID) | ORAL | Status: DC | PRN

## 2014-04-11 MED ORDER — METHYLPREDNISOLONE SODIUM SUCC 125 MG IJ SOLR
60.0000 mg | Freq: Two times a day (BID) | INTRAMUSCULAR | Status: DC
  Administered 2014-04-11 – 2014-04-13 (×4): 60 mg via INTRAVENOUS
  Filled 2014-04-11 (×4): qty 0.96
  Filled 2014-04-11: qty 2
  Filled 2014-04-11: qty 0.96
  Filled 2014-04-11: qty 2
  Filled 2014-04-11: qty 0.96

## 2014-04-11 MED ORDER — IPRATROPIUM-ALBUTEROL 0.5-2.5 (3) MG/3ML IN SOLN: 3.0000 mL | RESPIRATORY_TRACT | Status: DC | PRN

## 2014-04-11 MED ORDER — METHYLPREDNISOLONE SODIUM SUCC 125 MG IJ SOLR
60.0000 mg | Freq: Four times a day (QID) | INTRAMUSCULAR | Status: DC
  Administered 2014-04-11: 60 mg via INTRAVENOUS
  Filled 2014-04-11: qty 2

## 2014-04-11 MED ORDER — ACETAMINOPHEN 325 MG PO TABS
650.0000 mg | ORAL_TABLET | Freq: Four times a day (QID) | ORAL | Status: DC | PRN
  Administered 2014-04-11: 650 mg via ORAL
  Filled 2014-04-11: qty 2

## 2014-04-11 MED ORDER — AZITHROMYCIN 500 MG IV SOLR
500.0000 mg | INTRAVENOUS | Status: DC
  Administered 2014-04-11 – 2014-04-12 (×2): 500 mg via INTRAVENOUS
  Filled 2014-04-11 (×3): qty 500

## 2014-04-11 MED ORDER — ACETAMINOPHEN 650 MG RE SUPP
650.0000 mg | Freq: Four times a day (QID) | RECTAL | Status: DC | PRN
Start: 2014-04-11 — End: 2014-04-11

## 2014-04-11 MED ORDER — IBUPROFEN 600 MG PO TABS
600.0000 mg | ORAL_TABLET | Freq: Four times a day (QID) | ORAL | Status: DC | PRN
  Administered 2014-04-11 – 2014-04-12 (×2): 600 mg via ORAL
  Filled 2014-04-11 (×3): qty 1

## 2014-04-11 MED ORDER — ALBUTEROL (5 MG/ML) CONTINUOUS INHALATION SOLN
15.0000 mg/h | INHALATION_SOLUTION | Freq: Once | RESPIRATORY_TRACT | Status: AC
  Administered 2014-04-11: 15 mg/h via RESPIRATORY_TRACT
  Filled 2014-04-11: qty 20

## 2014-04-11 MED ORDER — IPRATROPIUM-ALBUTEROL 0.5-2.5 (3) MG/3ML IN SOLN
3.0000 mL | RESPIRATORY_TRACT | Status: DC
  Administered 2014-04-11 (×2): 3 mL via RESPIRATORY_TRACT
  Filled 2014-04-11 (×2): qty 3

## 2014-04-11 MED ORDER — IPRATROPIUM-ALBUTEROL 0.5-2.5 (3) MG/3ML IN SOLN
3.0000 mL | Freq: Three times a day (TID) | RESPIRATORY_TRACT | Status: DC
  Administered 2014-04-11 – 2014-04-13 (×5): 3 mL via RESPIRATORY_TRACT
  Filled 2014-04-11 (×5): qty 3

## 2014-04-11 MED ORDER — IPRATROPIUM BROMIDE 0.02 % IN SOLN
0.5000 mg | Freq: Once | RESPIRATORY_TRACT | Status: AC
  Administered 2014-04-11: 0.5 mg via RESPIRATORY_TRACT
  Filled 2014-04-11: qty 2.5

## 2014-04-11 MED ORDER — MORPHINE SULFATE 4 MG/ML IJ SOLN
4.0000 mg | Freq: Once | INTRAMUSCULAR | Status: AC
  Administered 2014-04-11: 4 mg via INTRAVENOUS
  Filled 2014-04-11: qty 1

## 2014-04-11 MED ORDER — ENOXAPARIN SODIUM 40 MG/0.4ML ~~LOC~~ SOLN
40.0000 mg | SUBCUTANEOUS | Status: DC
  Administered 2014-04-11 – 2014-04-12 (×2): 40 mg via SUBCUTANEOUS
  Filled 2014-04-11 (×3): qty 0.4

## 2014-04-11 NOTE — ED Notes (Signed)
Patient in NAD at time of transfer to [facility]

## 2014-04-11 NOTE — Progress Notes (Signed)
Patient Demographics  Jacob Ho, is a 38 y.o. male, DOB - 01/11/1976, BJY:[id number]  Admit date - 04/11/2014   Admitting Physician Lynden OxfordPranav Patel, MD  Outpatient Primary MD for the patient is No PCP Per Patient  LOS - 0   Chief Complaint  Patient presents with  . Wheezing  . Asthma        Subjective:   Jacob Ho today has, No headache, No chest pain, No abdominal pain - No Nausea, No new weakness tingling or numbness,  + Cough - wheezing - SOB.    Assessment & Plan    1. Acute Asthma exacerbation with Acute Hypoxic Resp Failure - with likely acute bronchitis, improving, follow cultures, will taper down to azithromycin along with IV steroids. Discontinue Rocephin. Nubulizer treatments as needed. No oxygen demand.   Code Status: Full  Family Communication: none present  Disposition Plan: Home   Procedures     Consults      Medications  Scheduled Meds: . azithromycin  500 mg Intravenous Q24H  . cefTRIAXone (ROCEPHIN)  IV  1 g Intravenous Q24H  . enoxaparin (LOVENOX) injection  40 mg Subcutaneous Q24H  . ipratropium-albuterol  3 mL Nebulization Q4H  . methylPREDNISolone (SOLU-MEDROL) injection  60 mg Intravenous Q6H  . mometasone-formoterol  2 puff Inhalation BID   Continuous Infusions:  PRN Meds:.acetaminophen **OR** acetaminophen, ibuprofen, ondansetron **OR** ondansetron (ZOFRAN) IV  DVT Prophylaxis  Lovenox   Lab Results  Component Value Date   PLT 176 04/11/2014    Antibiotics     Anti-infectives    Start     Dose/Rate Route Frequency Ordered Stop   04/11/14 0600  cefTRIAXone (ROCEPHIN) 1 g in dextrose 5 % 50 mL IVPB - Premix     1 g100 mL/hr over 30 Minutes Intravenous Every 24 hours 04/11/14 0501     04/11/14 0515  azithromycin (ZITHROMAX) 500 mg  in dextrose 5 % 250 mL IVPB     500 mg250 mL/hr over 60 Minutes Intravenous Every 24 hours 04/11/14 0501            Objective:   Filed Vitals:   04/11/14 0240 04/11/14 0307 04/11/14 0500 04/11/14 0751  BP: 128/89  122/72   Pulse: 91 98 99 90  Temp:   98.3 F (36.8 C)   TempSrc:   Oral   Resp: 18 25 20 18   Height:      Weight:      SpO2: 98% 91% 99% 99%    Wt Readings from Last 3 Encounters:  04/11/14 83.008 kg (183 lb)  02/18/14 82.555 kg (182 lb)    No intake or output data in the 24 hours ending 04/11/14 1011   Physical Exam  Awake Alert, Oriented X 3, No new F.N deficits, Normal affect [place].AT,PERRAL Supple Neck,No JVD, No cervical lymphadenopathy appriciated.  Symmetrical Chest wall movement, Good air movement bilaterally, +ve wheezing RRR,No Gallops,Rubs or new Murmurs, No Parasternal Heave +ve B.Sounds, Abd Soft, No tenderness, No organomegaly appriciated, No rebound - guarding or rigidity. No Cyanosis, Clubbing or edema, No new Rash or bruise      Data Review   Micro Results No results found for this or any previous visit (from the past 240 hour(s)).  Radiology Reports  Dg Chest Port 1 View  04/11/2014   CLINICAL DATA:  Worsening asthma symptoms for 48 hr  EXAM: PORTABLE CHEST - 1 VIEW  COMPARISON:  02/16/2014  FINDINGS: The heart size and mediastinal contours are within normal limits. Both lungs are clear. The visualized skeletal structures are unremarkable.  IMPRESSION: No active disease.   Electronically Signed   By: Signa Kellaylor  Stroud M.D.   On: 04/11/2014 01:52     CBC  Recent Labs Lab 04/11/14 0130  WBC 8.6  HGB 16.0  HCT 46.4  PLT 176  MCV 86.1  MCH 29.7  MCHC 34.5  RDW 13.7  LYMPHSABS 2.7  MONOABS 0.7  EOSABS 0.2  BASOSABS 0.0    Chemistries   Recent Labs Lab 04/11/14 0130  NA 143  K 3.7  CL 107  CO2 21  GLUCOSE 106*  BUN 8  CREATININE 1.11  CALCIUM 8.7    ------------------------------------------------------------------------------------------------------------------ estimated creatinine clearance is 88.5 mL/min (by C-G formula based on Cr of 1.11). ------------------------------------------------------------------------------------------------------------------ No results for input(s): HGBA1C in the last 72 hours. ------------------------------------------------------------------------------------------------------------------ No results for input(s): CHOL, HDL, LDLCALC, TRIG, CHOLHDL, LDLDIRECT in the last 72 hours. ------------------------------------------------------------------------------------------------------------------ No results for input(s): TSH, T4TOTAL, T3FREE, THYROIDAB in the last 72 hours.  Invalid input(s): FREET3 ------------------------------------------------------------------------------------------------------------------ No results for input(s): VITAMINB12, FOLATE, FERRITIN, TIBC, IRON, RETICCTPCT in the last 72 hours.  Coagulation profile No results for input(s): INR, PROTIME in the last 168 hours.  No results for input(s): DDIMER in the last 72 hours.  Cardiac Enzymes No results for input(s): CKMB, TROPONINI, MYOGLOBIN in the last 168 hours.  Invalid input(s): CK ------------------------------------------------------------------------------------------------------------------ Invalid input(s): POCBNP     Time Spent in minutes   35   Bita Cartwright K M.D on 04/11/2014 at 10:11 AM  Between 7am to 7pm - Pager - [phone]  After 7pm go to [email or web address] - password [email or web address]  And look for the night coverage person covering for me after hours  [facility] Office  [phone]

## 2014-04-11 NOTE — Plan of Care (Signed)
Problem: Phase I Progression Outcomes Goal: Pain controlled with appropriate interventions Outcome: Completed/Met Date Met:  04/11/14  Problem: Discharge Progression Outcomes Goal: Complications resolved/controlled Outcome: Not Applicable Date Met:  26/66/64

## 2014-04-11 NOTE — ED Provider Notes (Signed)
CSN: [id number]     Arrival date & time 04/11/14  0057 History  This chart was scribed for Jacob Ho H Kitara Hebb, MD by Annye AsaAnna Dorsett, ED Scribe. This patient was seen in room B16C/B16C and the patient's care was started at 1:08 AM.    Chief Complaint  Patient presents with  . Wheezing  . Asthma   The history is provided by the patient. No language interpreter was used.     HPI Comments: Jacob Ho is a 38 y.o. male with past medical history of asthma who presents to the Emergency Department complaining of asthma complications and wheezing.  Patient reports congestion, cough, rhinorrhea, and chills beginning yesterday and worsening today; he reports a sick contact at home with similar symptoms. He also notes a subjective fever and high blood pressure.   He received a neb treatment yesterday morning with slight improvement of wheezing and SOB; his SOB worsened today, patient used MDI at home with no relief. He was given Duoneb and 125mg  Solumedrol en route.   Patient is a former smoker; quit date 7 months PTA.   Past Medical History  Diagnosis Date  . Asthma    History reviewed. No pertinent past surgical history. Family History  Problem Relation Age of Onset  . Diabetes Mother   . Heart attack Father   . Asthma Sister   . Asthma Mother    History  Substance Use Topics  . Smoking status: Former Smoker    Start date: 05/21/1991    Quit date: 09/09/2013  . Smokeless tobacco: Never Used     Comment: smoking black and milds 1 a day  . Alcohol Use: No    Review of Systems  Constitutional: Positive for chills.  HENT: Positive for congestion and rhinorrhea.   Respiratory: Positive for cough, shortness of breath and wheezing.   All other systems reviewed and are negative.   Allergies  Shellfish allergy  Home Medications   Prior to Admission medications   Medication Sig Start Date End Date Taking? Authorizing Provider  albuterol (PROVENTIL HFA;VENTOLIN HFA) 108 (90 BASE)  MCG/ACT inhaler Inhale 2 puffs into the lungs every 4 (four) hours as needed for wheezing or shortness of breath. 02/18/14  Yes Alexa Senaida Oresichardson, MD  loratadine (CLARITIN) 10 MG tablet Take 1 tablet (10 mg total) by mouth daily. 02/18/14  Yes Alexa Senaida Oresichardson, MD  mometasone-formoterol (DULERA) 200-5 MCG/ACT AERO Inhale 2 puffs into the lungs 2 (two) times daily. 02/18/14  Yes Alexa Senaida Oresichardson, MD  predniSONE (DELTASONE) 20 MG tablet Take 1 tablet (20 mg total) by mouth daily with breakfast. Take 3 tablets on day 1 (02/19/14). Take 2 tablets on days 2 through 5 (02/20/14-02/23/14). Take 1 tablet days 6-9 (02/24/14-02/27/14). Patient taking differently: Take 20 mg by mouth daily with breakfast. Taking 20mg  daily 02/18/14  Yes Alexa Senaida Oresichardson, MD   BP 128/89 mmHg  Pulse 98  Temp(Src) 99.5 F (37.5 C) (Axillary)  Resp 25  Ht 5\' 9"  (1.753 m)  Wt 183 lb (83.008 kg)  BMI 27.01 kg/m2  SpO2 91% Physical Exam  Constitutional: He is oriented to person, place, and time. He appears well-developed and well-nourished.  HENT:  Head: Normocephalic and atraumatic.  Neck: No tracheal deviation present.  Cardiovascular: Normal rate, regular rhythm and normal heart sounds.  Exam reveals no gallop and no friction rub.   No murmur heard. Pulmonary/Chest: Effort normal. He has wheezes (Diffuse).  Slight tachypnea; no retractions; talking in full sentences  Neurological: He is alert and oriented  to person, place, and time.  Skin: Skin is warm and dry.  Psychiatric: He has a normal mood and affect. His behavior is normal.  Nursing note and vitals reviewed.   ED Course  Procedures   DIAGNOSTIC STUDIES: Oxygen Saturation is 99% on [place] (10L/min), normal by my interpretation.    COORDINATION OF CARE: 1:11 AM Discussed treatment plan with pt at bedside and pt agreed to plan.   Labs Review Labs Reviewed  BASIC METABOLIC PANEL - Abnormal; Notable for the following:    Glucose, Bld 106 (*)    GFR calc non Af Amer  82 (*)    All other components within normal limits  CBC WITH DIFFERENTIAL    Imaging Review Dg Chest Port 1 View  04/11/2014   CLINICAL DATA:  Worsening asthma symptoms for 48 hr  EXAM: PORTABLE CHEST - 1 VIEW  COMPARISON:  02/16/2014  FINDINGS: The heart size and mediastinal contours are within normal limits. Both lungs are clear. The visualized skeletal structures are unremarkable.  IMPRESSION: No active disease.   Electronically Signed   By: Signa Kellaylor  Stroud M.D.   On: 04/11/2014 01:52     EKG Interpretation   Date/Time:  Monday April 11 2014 01:04:15 EST Ventricular Rate:  89 PR Interval:  129 QRS Duration: 69 QT Interval:  360 QTC Calculation: 438 R Axis:   51 Text Interpretation:  Sinus rhythm LAE, consider biatrial enlargement  Probable anteroseptal infarct, old No significant change since last  tracing Confirmed by Darlyne Schmiesing  MD, Karyss Frese ([id number]) on 04/11/2014 1:12:00 AM      MDM   Final diagnoses:  Asthma   Jacob BobMichael Propst is a 38 y.o. male here with SOB, wheezing. Likely asthma exacerbation. Will give nebs. Solumedrol given by EMS.   3:31 AM After nebs, desat to 91%. tachypneic with ambulation and O2 improved with ambulation to 94%. Will admit given relative hypoxia.     I personally performed the services described in this documentation, which was scribed in my presence. The recorded information has been reviewed and is accurate.    Jacob Ho H Jacob Wolbert, MD 04/11/14 [phone]

## 2014-04-11 NOTE — Plan of Care (Signed)
Problem: Phase II Progression Outcomes Goal: IV changed to normal saline lock Outcome: Completed/Met Date Met:  04/11/14 Goal: Obtain order to discontinue catheter if appropriate Outcome: Not Applicable Date Met:  46/63/55 Goal: Other Phase II Outcomes/Goals Outcome: Not Applicable Date Met:  63/46/95  Problem: Phase III Progression Outcomes Goal: Voiding independently Outcome: Completed/Met Date Met:  04/11/14 Goal: Foley discontinued Outcome: Not Applicable Date Met:  84/87/42 Goal: Other Phase III Outcomes/Goals Outcome: Not Applicable Date Met:  54/93/82  Problem: Discharge Progression Outcomes Goal: Tolerating diet Outcome: Completed/Met Date Met:  04/11/14 Goal: Other Discharge Outcomes/Goals Outcome: Not Applicable Date Met:  35/10/50

## 2014-04-11 NOTE — H&P (Addendum)
[facility] History and Physical  Patient: Jacob Ho  QIO:[id number]  DOB: 11/23/1975  DOS: the patient was seen and examined on 04/11/2014 PCP: No PCP Per Patient  Chief Complaint: shortness of breath  HPI: Jacob Ho is a 38 y.o. male with Past medical history of asthma. The pt presented with complains of shortness of breath along with cough and fever as well as chills since last 1 week. His symptoms are progressively worsening and he has been using his albuterol inhaler regularly without any benefit. Earlier in the day he called EMS and they gave him breathing treatment and as he was feeling better he decided to stay home. As the day progressed he continued to have worsening symptoms and therefor he came to the hospital. He has a 40 year old son who also appear to have similar upper respiratory infection.  He was recently admitted in the hospital and was recommended to follow up with internal medicine residency service as an outpatient but he mentions that he has been travelling back and forth [employer] and [place] and has not been able to go for the follow up. He mentions that he has been not smoking since last 8 months.   The patient is coming from home And at his baseline independent for most of his ADL.  Review of Systems: as mentioned in the history of present illness.  A Comprehensive review of the other systems is negative.  Past Medical History  Diagnosis Date  . Asthma    History reviewed. No pertinent past surgical history. Social History:  reports that he quit smoking about 7 months ago. He started smoking about 22 years ago. He has never used smokeless tobacco. He reports that he uses illicit drugs. He reports that he does not drink alcohol.  Allergies  Allergen Reactions  . Shellfish Allergy Anaphylaxis    Family History  Problem Relation Age of Onset  . Diabetes Mother   . Heart attack Father   . Asthma Sister   . Asthma Mother     Prior to  Admission medications   Medication Sig Start Date End Date Taking? Authorizing Provider  albuterol (PROVENTIL HFA;VENTOLIN HFA) 108 (90 BASE) MCG/ACT inhaler Inhale 2 puffs into the lungs every 4 (four) hours as needed for wheezing or shortness of breath. 02/18/14  Yes Alexa Senaida Oresichardson, MD  loratadine (CLARITIN) 10 MG tablet Take 1 tablet (10 mg total) by mouth daily. 02/18/14  Yes Alexa Senaida Oresichardson, MD  mometasone-formoterol (DULERA) 200-5 MCG/ACT AERO Inhale 2 puffs into the lungs 2 (two) times daily. 02/18/14  Yes Alexa Senaida Oresichardson, MD  predniSONE (DELTASONE) 20 MG tablet Take 1 tablet (20 mg total) by mouth daily with breakfast. Take 3 tablets on day 1 (02/19/14). Take 2 tablets on days 2 through 5 (02/20/14-02/23/14). Take 1 tablet days 6-9 (02/24/14-02/27/14). Patient taking differently: Take 20 mg by mouth daily with breakfast. Taking 20mg  daily 02/18/14  Yes Jill AlexandersAlexa Richardson, MD    Physical Exam: Filed Vitals:   04/11/14 0158 04/11/14 0240 04/11/14 0307 04/11/14 0500  BP: 144/77 128/89  122/72  Pulse: 93 91 98 99  Temp:    98.3 F (36.8 C)  TempSrc:    Oral  Resp: 18 18 25 20   Height:      Weight:      SpO2: 99% 98% 91% 99%    General: Alert, Awake and Oriented to Time, Place and Person. Appear in moderate distress Eyes: PERRL ENT: Oral Mucosa clear moist. Neck: no JVD Cardiovascular: S1 and  S2 Present, no Murmur, Peripheral Pulses Present Respiratory: Bilateral Air entry equal and Decreased, no Crackles, bilateral extensive expiroatory wheezes Abdomen: Bowel Sound present, Soft and non tender Skin: no Rash Extremities: no Pedal edema, no calf tenderness Neurologic: Grossly no focal neuro deficit.  Labs on Admission:  CBC:  Recent Labs Lab 04/11/14 0130  WBC 8.6  NEUTROABS 5.1  HGB 16.0  HCT 46.4  MCV 86.1  PLT 176    CMP     Component Value Date/Time   NA 143 04/11/2014 0130   K 3.7 04/11/2014 0130   CL 107 04/11/2014 0130   CO2 21 04/11/2014 0130   GLUCOSE 106*  04/11/2014 0130   BUN 8 04/11/2014 0130   CREATININE 1.11 04/11/2014 0130   CALCIUM 8.7 04/11/2014 0130   GFRNONAA 82* 04/11/2014 0130   GFRAA >90 04/11/2014 0130    No results for input(s): LIPASE, AMYLASE in the last 168 hours. No results for input(s): AMMONIA in the last 168 hours.  No results for input(s): CKTOTAL, CKMB, CKMBINDEX, TROPONINI in the last 168 hours. BNP (last 3 results) No results for input(s): PROBNP in the last 8760 hours.  Radiological Exams on Admission: Dg Chest Port 1 View  04/11/2014   CLINICAL DATA:  Worsening asthma symptoms for 48 hr  EXAM: PORTABLE CHEST - 1 VIEW  COMPARISON:  02/16/2014  FINDINGS: The heart size and mediastinal contours are within normal limits. Both lungs are clear. The visualized skeletal structures are unremarkable.  IMPRESSION: No active disease.   Electronically Signed   By: Signa Kellaylor  Stroud M.D.   On: 04/11/2014 01:52    Assessment/Plan Principal Problem:   Asthma exacerbation   1. Asthma exacerbation The patient presented with complaints of shortness of breath, cough; he has expiratory wheezing, respiratory distress, tachypnea, with use of accessory muscles of respiration. He is not hypoxic or hypotensive but has significant potential of worsening and there for Patient will be admitted for Asthma exacerbation secondary to acute bronchitis. I will get sputum culture, urine antigens, influenza PCR. I will treat the patient with IV Solu-Medrol 60 mg Q 6hours, DUONEB every 4 hours, oxygen as needed, IV antibiotics ceftriaxone and azithromycin.  Advance goals of care discussion: full code  DVT Prophylaxis: subcutaneous Heparin Nutrition: regular diet  Disposition: Admitted to inpatient in med-surge unit.  Author: Lynden OxfordPranav Deva Ron, MD [facility] Pager: [phone] 04/11/2014, 5:03 AM    If 7PM-7AM, please contact night-coverage [email or web address] Password [email or web address]

## 2014-04-11 NOTE — ED Notes (Signed)
Per [facility] - pt from home - pt w/ 2-3 days of "feeling sick" pt received neb tx yesterday a.m. W/ minimal improvement of wheezing and shortness of breath. Today shortness of breath has gotten progressively worse, used MDI at home w/o relief. Pt found to be diaphoretic on EMS arrival w/ expiratory wheezing in all fields. Pt given DUONEB and 125mg  solumedrol IVP en route. Pt A&Ox4, no acute distress on arrival.

## 2014-04-11 NOTE — ED Notes (Signed)
Upon ambulating pt w/ pulse ox - pt initially 100% on RA after ambulating pulse ox down to 94%. While lying on stretcher at this time, pulse ox 91% on RA. Dr. Silverio LayYao aware.

## 2014-04-11 NOTE — Care Management Note (Signed)
    Page 1 of 1   04/13/2014     4:09:35 PM CARE MANAGEMENT NOTE 04/13/2014  Patient:  Jacob Ho,Jacob Ho   Account Number:  [id number]  Date Initiated:  04/11/2014  Documentation initiated by:  Letha CapeAYLOR,Neilah Fulwider  Subjective/Objective Assessment:   dx asthma  admit- lives with spouse.     Action/Plan:   Anticipated DC Date:  04/13/2014   Anticipated DC Plan:  HOME/SELF CARE      DC Planning Services  CM consult  Medication Assistance  Follow-up appt scheduled      Choice offered to / List presented to:             Status of service:  Completed, signed off Medicare Important Message given?  NO (If response is "NO", the following Medicare IM given date fields will be blank) Date Medicare IM given:   Medicare IM given by:   Date Additional Medicare IM given:   Additional Medicare IM given by:    Discharge Disposition:  HOME/SELF CARE  Per UR Regulation:  Reviewed for med. necessity/level of care/duration of stay  If discussed at Long Length of Stay Meetings, dates discussed:    Comments:  04/13/14 1554 Letha Capeeborah Adarius Tigges RN, BSN [phone] patient dc today, the [facility] pharmacy was open today, so did not have to do Match.  Patient went to clinic to get meds and has a f/u apt on Monday.  04/12/14 1801 Letha Capeeborah Robbert Langlinais RN ,BSN [phone] patient is for dc tomorrow so will need med ast with [facility] because [facility] is closed.  04/11/14 1125 Letha Capeeborah Darcel Zick RN, BSN [phone] patient lives with spouse,  [facility], [facility] are [facility] for today, will need to call tomorrow to schedule apt.

## 2014-04-11 NOTE — Progress Notes (Signed)
Utilization review completed.  

## 2014-04-11 NOTE — Plan of Care (Signed)
Problem: Phase I Progression Outcomes Goal: OOB as tolerated unless otherwise ordered Outcome: Completed/Met Date Met:  04/11/14 Goal: Voiding-avoid urinary catheter unless indicated Outcome: Completed/Met Date Met:  04/11/14 Goal: Other Phase I Outcomes/Goals Outcome: Not Applicable Date Met:  01/48/40

## 2014-04-12 LAB — HEMOGLOBIN A1C
Hgb A1c MFr Bld: 5.3 % (ref <5.7)
MEAN PLASMA GLUCOSE: 105 mg/dL (ref <117)

## 2014-04-12 LAB — TSH: TSH: 0.238 u[IU]/mL — ABNORMAL LOW (ref 0.350–4.500)

## 2014-04-12 LAB — VITAMIN B12: VITAMIN B 12: 479 pg/mL (ref 211–911)

## 2014-04-12 MED ORDER — AZITHROMYCIN 500 MG PO TABS
500.0000 mg | ORAL_TABLET | Freq: Every day | ORAL | Status: DC
  Administered 2014-04-13: 500 mg via ORAL
  Filled 2014-04-12: qty 1

## 2014-04-12 NOTE — Progress Notes (Signed)
Patient Demographics  Jacob Ho, is a 38 y.o. male, DOB - 06/07/1975, [id number]  Admit date - 04/11/2014   Admitting Physician Lynden OxfordPranav Patel, MD  Outpatient Primary MD for the patient is No PCP Per Patient  LOS - 1   Chief Complaint  Patient presents with  . Wheezing  . Asthma        Subjective:   Jacob Ho today has, No headache, No chest pain, No abdominal pain - No Nausea, No new weakness tingling or numbness,  + Cough - wheezing - SOB.    Assessment & Plan    1. Acute Asthma exacerbation with Acute Hypoxic Resp Failure - with likely acute bronchitis, improving, follow cultures, will taper down to azithromycin along with IV steroids. Discontinued Rocephin. Nubulizer treatments as needed. No oxygen demand. Likely DC home in am   2. Mild intermittent bilateral upper extremity numbness after coughing episodes. 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 Check B12, folate, A1c  and TSH.    Code Status: Full  Family Communication: none present  Disposition Plan: Home   Procedures     Consults      Medications  Scheduled Meds: . azithromycin  500 mg Intravenous Q24H  . enoxaparin (LOVENOX) injection  40 mg Subcutaneous Q24H  . ipratropium-albuterol  3 mL Nebulization TID  . methylPREDNISolone (SOLU-MEDROL) injection  60 mg Intravenous Q12H  . mometasone-formoterol  2 puff Inhalation BID   Continuous Infusions:  PRN Meds:.acetaminophen **OR** [DISCONTINUED] acetaminophen, ibuprofen, ipratropium-albuterol, [DISCONTINUED] ondansetron **OR** ondansetron (ZOFRAN) IV  DVT Prophylaxis  Lovenox   Lab Results  Component Value Date   PLT 176 04/11/2014    Antibiotics     Anti-infectives    Start      Dose/Rate Route Frequency Ordered Stop   04/11/14 0600  cefTRIAXone (ROCEPHIN) 1 g in dextrose 5 % 50 mL IVPB - Premix  Status:  Discontinued     1 g100 mL/hr over 30 Minutes Intravenous Every 24 hours 04/11/14 0501 04/11/14 1013   04/11/14 0515  azithromycin (ZITHROMAX) 500 mg in dextrose 5 % 250 mL IVPB     500 mg250 mL/hr over 60 Minutes Intravenous Every 24 hours 04/11/14 0501            Objective:   Filed Vitals:   04/12/14 0500 04/12/14 0533 04/12/14 0715 04/12/14 0832  BP:  112/64    Pulse:  85    Temp:  98.5 F (36.9 C)    TempSrc:  Oral    Resp:  20    Height:      Weight: 80.2 kg (176 lb 12.9 oz)  79.652 kg (175 lb 9.6 oz)   SpO2:  98%  97%    Wt Readings from Last 3 Encounters:  04/12/14 79.652 kg (175 lb 9.6 oz)  02/18/14 82.555 kg (182 lb)     Intake/Output Summary (Last 24 hours) at 04/12/14 0907 Last data filed at 04/12/14 0653  Gross per 24 hour  Intake    500 ml  Output      0 ml  Net    500 ml     Physical Exam  Awake Alert, Oriented X 3, No new F.N deficits, Normal affect  [place].AT,PERRAL Supple Neck,No JVD, No cervical lymphadenopathy appriciated.  Symmetrical Chest wall movement, Good air movement bilaterally, +ve wheezing RRR,No Gallops,Rubs or new Murmurs, No Parasternal Heave +ve B.Sounds, Abd Soft, No tenderness, No organomegaly appriciated, No rebound - guarding or rigidity. No Cyanosis, Clubbing or edema, No new Rash or bruise      Data Review   Micro Results No results found for this or any previous visit (from the past 240 hour(s)).  Radiology Reports Dg Chest Port 1 View  04/11/2014   CLINICAL DATA:  Worsening asthma symptoms for 48 hr  EXAM: PORTABLE CHEST - 1 VIEW  COMPARISON:  02/16/2014  FINDINGS: The heart size and mediastinal contours are within normal limits. Both lungs are clear. The visualized skeletal structures are unremarkable.  IMPRESSION: No active disease.   Electronically Signed   By: Signa Kellaylor  Stroud M.D.   On:  04/11/2014 01:52     CBC  Recent Labs Lab 04/11/14 0130  WBC 8.6  HGB 16.0  HCT 46.4  PLT 176  MCV 86.1  MCH 29.7  MCHC 34.5  RDW 13.7  LYMPHSABS 2.7  MONOABS 0.7  EOSABS 0.2  BASOSABS 0.0    Chemistries   Recent Labs Lab 04/11/14 0130  NA 143  K 3.7  CL 107  CO2 21  GLUCOSE 106*  BUN 8  CREATININE 1.11  CALCIUM 8.7   ------------------------------------------------------------------------------------------------------------------ estimated creatinine clearance is 88.5 mL/min (by C-G formula based on Cr of 1.11). ------------------------------------------------------------------------------------------------------------------ No results for input(s): HGBA1C in the last 72 hours. ------------------------------------------------------------------------------------------------------------------ No results for input(s): CHOL, HDL, LDLCALC, TRIG, CHOLHDL, LDLDIRECT in the last 72 hours. ------------------------------------------------------------------------------------------------------------------ No results for input(s): TSH, T4TOTAL, T3FREE, THYROIDAB in the last 72 hours.  Invalid input(s): FREET3 ------------------------------------------------------------------------------------------------------------------ No results for input(s): VITAMINB12, FOLATE, FERRITIN, TIBC, IRON, RETICCTPCT in the last 72 hours.  Coagulation profile No results for input(s): INR, PROTIME in the last 168 hours.  No results for input(s): DDIMER in the last 72 hours.  Cardiac Enzymes No results for input(s): CKMB, TROPONINI, MYOGLOBIN in the last 168 hours.  Invalid input(s): CK ------------------------------------------------------------------------------------------------------------------ Invalid input(s): POCBNP     Time Spent in minutes   35   Kriste Broman K M.D on 04/12/2014 at 9:07 AM  Between 7am to 7pm - Pager - [phone]  After 7pm go to [email or web address] -  password [email or web address]  And look for the night coverage person covering for me after hours  [facility] Office  [phone]

## 2014-04-13 LAB — FOLATE RBC: RBC FOLATE: 735 ng/mL — AB (ref >280)

## 2014-04-13 LAB — LEGIONELLA ANTIGEN, URINE

## 2014-04-13 MED ORDER — ALBUTEROL SULFATE HFA 108 (90 BASE) MCG/ACT IN AERS: 2.0000 | INHALATION_SPRAY | RESPIRATORY_TRACT | Status: DC | PRN

## 2014-04-13 MED ORDER — PREDNISONE 5 MG PO TABS: ORAL_TABLET | ORAL | Status: DC

## 2014-04-13 MED ORDER — CETIRIZINE HCL 10 MG PO CAPS
1.0000 | ORAL_CAPSULE | Freq: Every evening | ORAL | Status: DC | PRN
Start: 2014-04-13 — End: 2017-07-09

## 2014-04-13 MED ORDER — AZITHROMYCIN 250 MG PO TABS
250.0000 mg | ORAL_TABLET | Freq: Every day | ORAL | Status: DC
Start: 2014-04-13 — End: 2017-12-27

## 2014-04-13 MED ORDER — ALBUTEROL SULFATE (2.5 MG/3ML) 0.083% IN NEBU: 2.5000 mg | INHALATION_SOLUTION | RESPIRATORY_TRACT | Status: DC | PRN

## 2014-04-13 NOTE — Progress Notes (Signed)
Pt DC home. Verbalized understanding of DC instructions. Out via wheelchair with volunteer.

## 2014-04-13 NOTE — Discharge Summary (Signed)
Jacob Ho, is a 38 y.o. male  DOB November 03, 1975  MRN [id number].  Admission date:  04/11/2014  Admitting Physician  Lynden Oxford, MD  Discharge Date:  04/13/2014   Primary MD  No PCP Per Patient  Recommendations for primary care physician for things to follow:   Check CXR 2 view, CBC and BMP in 1-2 weeks  Must check TSH, free T3 and T4 in 2-3 weeks.    Admission Diagnosis  Asthma [J45.909]   Discharge Diagnosis  Asthma [J45.909]     Principal Problem:   Asthma exacerbation      Past Medical History  Diagnosis Date  . Asthma     History reviewed. No pertinent past surgical history.     History of present illness and  Hospital Course:     Kindly see H&P for history of present illness and admission details, please review complete Labs, Consult reports and Test reports for all details in brief  HPI  from the history and physical done on the day of admission  Jacob Ho is a 38 y.o. male with Past medical history of asthma. The pt presented with complains of shortness of breath along with cough and fever as well as chills since last 1 week. His symptoms are progressively worsening and he has been using his albuterol inhaler regularly without any benefit.  Earlier in the day he called EMS and they gave him breathing treatment and as he was feeling better he decided to stay home. As the day progressed he continued to have worsening symptoms and therefor he came to the hospital.  He has a 39 year old son who also appear to have similar upper respiratory infection. He was recently admitted in the hospital and was recommended to follow up with internal medicine residency service as an outpatient but he mentions that he has been travelling back and forth [place] and [place] and has not been able to go for the follow  up. He mentions that he has been not smoking since last 8 months.   Hospital Course    1. Acute Asthma exacerbation with Acute Hypoxic Resp Failure - with likely acute bronchitis, improving, follow cultures, will taper down to azithromycin along with IV steroids. Discontinued Rocephin. Nubulizer treatments as needed. No oxygen demand. Likely DC home in am    2. Mild intermittent bilateral upper extremity numbness after coughing episodes. 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 Stable B12, folate, A1c, symptoms have resolved.      3. Mildly Low TSH - repeat TSH, Free T3, T4 in 1-2 weeks       Discharge Condition:  Stable   Follow UP  Follow-up Information    Follow up with [facility]     On 04/18/2014.   Why:  9 am  for hospital follow up   Contact information:   [address] [place] [address] [phone]        Discharge Instructions  and  Discharge Medications          Discharge Instructions    Diet - low sodium heart healthy    Complete by:  As directed      Discharge instructions    Complete by:  As directed   Follow with Primary MD  in 7 days   Get CBC, CMP, TSH, free T3, T4 , 2 view Chest X ray checked  by Primary MD next visit.     Activity: As tolerated with Full fall precautions use walker/cane & assistance as needed   Disposition Home     Diet: Heart Healthy    For Heart failure patients - Check your Weight same time everyday, if you gain over 2 pounds, or you develop in leg swelling, experience more shortness of breath or chest pain, call your Primary MD immediately. Follow Cardiac Low Salt Diet and 1.8 lit/day fluid restriction.   On your next visit with your primary care physician please Get Medicines reviewed and adjusted.   Please request your Prim.MD to go over all Hospital Tests and Procedure/Radiological results  at the follow up, please get all Hospital records sent to your Prim MD by signing hospital release before you go home.   If you experience worsening of your admission symptoms, develop shortness of breath, life threatening emergency, suicidal or homicidal thoughts you must seek medical attention immediately by calling 911 or calling your MD immediately  if symptoms less severe.  You Must read complete instructions/literature along with all the possible adverse reactions/side effects for all the Medicines you take and that have been prescribed to you. Take any new Medicines after you have completely understood and accpet all the possible adverse reactions/side effects.   Do not drive, operating heavy machinery, perform activities at heights, swimming or participation in water activities or provide baby sitting services if your were admitted for syncope or siezures until you have seen by Primary MD or a Neurologist and advised to do so again.  Do not drive when taking Pain medications.    Do not take more than prescribed Pain, Sleep and Anxiety Medications  Special Instructions: If you have smoked or chewed Tobacco  in the last 2 yrs please stop smoking, stop any regular Alcohol  and or any Recreational drug use.  Wear Seat belts while driving.   Please note  You were cared for by a hospitalist during your hospital stay. If you have any questions about your discharge medications or the care you received while you were in the hospital after you are discharged, you can call the unit and asked to speak with the hospitalist on call if the hospitalist that took care of you is not available. Once you are discharged, your primary care physician will handle any further medical issues. Please note that NO REFILLS for any discharge medications will be authorized once you are discharged, as it is imperative that you return to your primary care physician (or establish a relationship with a primary care  physician if you do not have one) for your aftercare needs so that they can reassess your need for medications and monitor your lab values.     Increase activity slowly    Complete by:  As directed             Medication List    STOP taking these medications        predniSONE 20 MG tablet  Commonly known as:  DELTASONE  Replaced by:  predniSONE 5  MG tablet      TAKE these medications        albuterol 108 (90 BASE) MCG/ACT inhaler  Commonly known as:  PROVENTIL HFA;VENTOLIN HFA  Inhale 2 puffs into the lungs every 4 (four) hours as needed for wheezing or shortness of breath.     albuterol 108 (90 BASE) MCG/ACT inhaler  Commonly known as:  PROVENTIL HFA;VENTOLIN HFA  Inhale 2 puffs into the lungs every 4 (four) hours as needed for wheezing or shortness of breath.     azithromycin 250 MG tablet  Commonly known as:  ZITHROMAX  Take 1 tablet (250 mg total) by mouth daily.     Cetirizine HCl 10 MG Caps  Commonly known as:  ZYRTEC ALLERGY  Take 1 capsule (10 mg total) by mouth at bedtime and may repeat dose one time if needed.     loratadine 10 MG tablet  Commonly known as:  CLARITIN  Take 1 tablet (10 mg total) by mouth daily.     mometasone-formoterol 200-5 MCG/ACT Aero  Commonly known as:  DULERA  Inhale 2 puffs into the lungs 2 (two) times daily.     predniSONE 5 MG tablet  Commonly known as:  DELTASONE  Label  & dispense according to the schedule below. 10 Pills PO for 3 days then, 8 Pills PO for 3 days, 6 Pills PO for 3 days, 4 Pills PO for 3 days, 2 Pills PO for 3 days, 1 Pills PO for 3 days, 1/2 Pill  PO for 3 days then STOP. Total 95 pills.          Diet and Activity recommendation: See Discharge Instructions above   Consults obtained - None   Major procedures and Radiology Reports - PLEASE review detailed and final reports for all details, in brief -     Dg Chest Port 1 View  04/11/2014   CLINICAL DATA:  Worsening asthma symptoms for 48 hr  EXAM:  PORTABLE CHEST - 1 VIEW  COMPARISON:  02/16/2014  FINDINGS: The heart size and mediastinal contours are within normal limits. Both lungs are clear. The visualized skeletal structures are unremarkable.  IMPRESSION: No active disease.   Electronically Signed   By: Signa Kell M.D.   On: 04/11/2014 01:52    Micro Results      No results found for this or any previous visit (from the past 240 hour(s)).   Today   Subjective:   Jacob Ho today has no headache,no chest abdominal pain,no new weakness tingling or numbness, feels much better wants to go home today.   Objective:   Blood pressure 109/61, pulse 77, temperature 98.4 F (36.9 C), temperature source Oral, resp. rate 18, height 5\' 9"  (1.753 m), weight 80.7 kg (177 lb 14.6 oz), SpO2 97 %.   Intake/Output Summary (Last 24 hours) at 04/13/14 0945 Last data filed at 04/13/14 0935  Gross per 24 hour  Intake    720 ml  Output      0 ml  Net    720 ml    Exam  Awake Alert, Oriented x 3, No new F.N deficits, Normal affect Beason.AT,PERRAL Supple Neck,No JVD, No cervical lymphadenopathy appriciated.  Symmetrical Chest wall movement, Good air movement bilaterally, minimal wheezing RRR,No Gallops,Rubs or new Murmurs, No Parasternal Heave +ve B.Sounds, Abd Soft, Non tender, No organomegaly appriciated, No rebound -guarding or rigidity. No Cyanosis, Clubbing or edema, No new Rash or bruise   Data Review   CBC w Diff:  Lab  Results  Component Value Date   WBC 8.6 04/11/2014   HGB 16.0 04/11/2014   HCT 46.4 04/11/2014   PLT 176 04/11/2014   LYMPHOPCT 31 04/11/2014   MONOPCT 8 04/11/2014   EOSPCT 2 04/11/2014   BASOPCT 0 04/11/2014    CMP:  Lab Results  Component Value Date   NA 143 04/11/2014   K 3.7 04/11/2014   CL 107 04/11/2014   CO2 21 04/11/2014   BUN 8 04/11/2014   CREATININE 1.11 04/11/2014  .  Lab Results  Component Value Date   TSH 0.238* 04/12/2014     Total Time in preparing paper work, data  evaluation and todays exam - 35 minutes  Leroy SeaSINGH,March Steyer K M.D on 04/13/2014 at 9:45 AM  [facility] Office  [phone]

## 2014-04-13 NOTE — Discharge Instructions (Signed)
Follow with Primary MD  in 7 days   Get CBC, CMP, TSH, free T3, T4 , 2 view Chest X ray checked  by Primary MD next visit.    Activity: As tolerated with Full fall precautions use walker/cane & assistance as needed   Disposition Home     Diet: Heart Healthy    For Heart failure patients - Check your Weight same time everyday, if you gain over 2 pounds, or you develop in leg swelling, experience more shortness of breath or chest pain, call your Primary MD immediately. Follow Cardiac Low Salt Diet and 1.8 lit/day fluid restriction.   On your next visit with your primary care physician please Get Medicines reviewed and adjusted.   Please request your Prim.MD to go over all Hospital Tests and Procedure/Radiological results at the follow up, please get all Hospital records sent to your Prim MD by signing hospital release before you go home.   If you experience worsening of your admission symptoms, develop shortness of breath, life threatening emergency, suicidal or homicidal thoughts you must seek medical attention immediately by calling 911 or calling your MD immediately  if symptoms less severe.  You Must read complete instructions/literature along with all the possible adverse reactions/side effects for all the Medicines you take and that have been prescribed to you. Take any new Medicines after you have completely understood and accpet all the possible adverse reactions/side effects.   Do not drive, operating heavy machinery, perform activities at heights, swimming or participation in water activities or provide baby sitting services if your were admitted for syncope or siezures until you have seen by Primary MD or a Neurologist and advised to do so again.  Do not drive when taking Pain medications.    Do not take more than prescribed Pain, Sleep and Anxiety Medications  Special Instructions: If you have smoked or chewed Tobacco  in the last 2 yrs please stop smoking, stop any regular  Alcohol  and or any Recreational drug use.  Wear Seat belts while driving.   Please note  You were cared for by a hospitalist during your hospital stay. If you have any questions about your discharge medications or the care you received while you were in the hospital after you are discharged, you can call the unit and asked to speak with the hospitalist on call if the hospitalist that took care of you is not available. Once you are discharged, your primary care physician will handle any further medical issues. Please note that NO REFILLS for any discharge medications will be authorized once you are discharged, as it is imperative that you return to your primary care physician (or establish a relationship with a primary care physician if you do not have one) for your aftercare needs so that they can reassess your need for medications and monitor your lab values.  Asthma Asthma is a condition of the lungs in which the airways tighten and narrow. Asthma can make it hard to breathe. Asthma cannot be cured, but medicine and lifestyle changes can help control it. Asthma may be started (triggered) by:  Animal skin flakes (dander).  Dust.  Cockroaches.  Pollen.  Mold.  Smoke.  Cleaning products.  Hair sprays or aerosol sprays.  Paint fumes or strong smells.  Cold air, weather changes, and winds.  Crying or laughing hard.  Stress.  Certain medicines or drugs.  Foods, such as dried fruit, potato chips, and sparkling grape juice.  Infections or conditions (colds, flu).  Exercise.  Certain medical conditions or diseases.  Exercise or tiring activities. HOME CARE   Take medicine as told by your doctor.  Use a peak flow meter as told by your doctor. A peak flow meter is a tool that measures how well the lungs are working.  Record and keep track of the peak flow meter's readings.  Understand and use the asthma action plan. An asthma action plan is a written plan for taking  care of your asthma and treating your attacks.  To help prevent asthma attacks:  Do not smoke. Stay away from secondhand smoke.  Change your heating and air conditioning filter often.  Limit your use of fireplaces and wood stoves.  Get rid of pests (such as roaches and mice) and their droppings.  Throw away plants if you see mold on them.  Clean your floors. Dust regularly. Use cleaning products that do not smell.  Have someone vacuum when you are not home. Use a vacuum cleaner with a HEPA filter if possible.  Replace carpet with wood, tile, or vinyl flooring. Carpet can trap animal skin flakes and dust.  Use allergy-proof pillows, mattress covers, and box spring covers.  Wash bed sheets and blankets every week in hot water and dry them in a dryer.  Use blankets that are made of polyester or cotton.  Clean bathrooms and kitchens with bleach. If possible, have someone repaint the walls in these rooms with mold-resistant paint. Keep out of the rooms that are being cleaned and painted.  Wash hands often. GET HELP IF:  You have make a whistling sound when breaking (wheeze), have shortness of breath, or have a cough even if taking medicine to prevent attacks.  The colored mucus you cough up (sputum) is thicker than usual.  The colored mucus you cough up changes from clear or white to yellow, green, gray, or bloody.  You have problems from the medicine you are taking such as:  A rash.  Itching.  Swelling.  Trouble breathing.  You need reliever medicines more than 2-3 times a week.  Your peak flow measurement is still at 50-79% of your personal best after following the action plan for 1 hour.  You have a fever. GET HELP RIGHT AWAY IF:   You seem to be worse and are not responding to medicine during an asthma attack.  You are short of breath even at rest.  You get short of breath when doing very little activity.  You have trouble eating, drinking, or  talking.  You have chest pain.  You have a fast heartbeat.  Your lips or fingernails start to turn blue.  You are light-headed, dizzy, or faint.  Your peak flow is less than 50% of your personal best. MAKE SURE YOU:   Understand these instructions.  Will watch your condition.  Will get help right away if you are not doing well or get worse. Document Released: 10/23/2007 Document Revised: 09/20/2013 Document Reviewed: 12/03/2012 [facility] Patient Information 2015 [place]. This information is not intended to replace advice given to you by your health care provider. Make sure you discuss any questions you have with your health care provider.

## 2017-05-23 ENCOUNTER — Emergency Department (HOSPITAL_COMMUNITY): Payer: Self-pay

## 2017-05-23 ENCOUNTER — Other Ambulatory Visit: Payer: Self-pay

## 2017-05-23 ENCOUNTER — Encounter (HOSPITAL_COMMUNITY): Payer: Self-pay | Admitting: *Deleted

## 2017-05-23 ENCOUNTER — Emergency Department (HOSPITAL_COMMUNITY)
Admission: EM | Admit: 2017-05-23 | Discharge: 2017-05-23 | Disposition: A | Discharge status: Home / Self Care | Payer: Self-pay | Attending: Emergency Medicine | Admitting: Emergency Medicine

## 2017-05-23 DIAGNOSIS — Z87891 Personal history of nicotine dependence: Secondary | ICD-10-CM | POA: Insufficient documentation

## 2017-05-23 DIAGNOSIS — J45901 Unspecified asthma with (acute) exacerbation: Secondary | ICD-10-CM | POA: Insufficient documentation

## 2017-05-23 DIAGNOSIS — Z79899 Other long term (current) drug therapy: Secondary | ICD-10-CM | POA: Insufficient documentation

## 2017-05-23 DIAGNOSIS — R079 Chest pain, unspecified: Secondary | ICD-10-CM

## 2017-05-23 LAB — I-STAT TROPONIN, ED: TROPONIN I, POC: 0 ng/mL (ref 0.00–0.08)

## 2017-05-23 MED ORDER — IPRATROPIUM-ALBUTEROL 0.5-2.5 (3) MG/3ML IN SOLN
3.0000 mL | Freq: Once | RESPIRATORY_TRACT | Status: AC
  Administered 2017-05-23: 3 mL via RESPIRATORY_TRACT
  Filled 2017-05-23: qty 3

## 2017-05-23 MED ORDER — ALBUTEROL SULFATE HFA 108 (90 BASE) MCG/ACT IN AERS
1.0000 | INHALATION_SPRAY | Freq: Four times a day (QID) | RESPIRATORY_TRACT | Status: DC
  Administered 2017-05-23: 1 via RESPIRATORY_TRACT
  Filled 2017-05-23: qty 6.7

## 2017-05-23 MED ORDER — ACETAMINOPHEN 325 MG PO TABS
650.0000 mg | ORAL_TABLET | Freq: Once | ORAL | Status: AC
Start: 2017-05-23 — End: 2017-05-23
  Administered 2017-05-23: 650 mg via ORAL
  Filled 2017-05-23: qty 2

## 2017-05-23 MED ORDER — ALBUTEROL SULFATE (2.5 MG/3ML) 0.083% IN NEBU
5.0000 mg | INHALATION_SOLUTION | Freq: Once | RESPIRATORY_TRACT | Status: AC
  Administered 2017-05-23: 5 mg via RESPIRATORY_TRACT
  Filled 2017-05-23: qty 6

## 2017-05-23 NOTE — Discharge Instructions (Signed)
You were sent home with a prescription for an albuterol inhaler.  Please use 2 puffs as needed for shortness of breath every 4-6 hours.  If your shortness of breath persists, worsens or you start to experience chest pain please return to the ER.  Follow-up with your primary care provider within 3-5 days.

## 2017-05-23 NOTE — ED Provider Notes (Signed)
[name] EMERGENCY DEPARTMENT Provider Note   CSN: [id number] Arrival date & time: 05/23/17  0830     History   Chief Complaint Chief Complaint  Patient presents with  . Cough  . Asthma    HPI Jacob Ho is a 42 y.o. male.  HPI   42 year old male with history of asthma presents the ED complaining of worsening shortness of breath and wheezing over the last 3-4 days.  Has been using his albuterol rescue inhaler countless times a day and is now run out.  Is not sure if he has used a steroid inhaler before, is not taking 1 currently.  Patient reports rhinorrhea and cough with clear mucus production. Also reports constant midsternal chest pain.  Chest pain is nonradiating and rates it at a 7/10.  Feels like a dull pain.  Not worse with movement or palpation.  Has Never had this before.  No fevers, abdominal pain, NVD, constipation, calf pain/swelling/redness.  No recent periods of extended travel/immobility.  No recent surgeries.  No history of cancer. No personal or family h/o history of blood clots. has been admitted and intubated for asthma as a child.  Past Medical History:  Diagnosis Date  . Asthma     Patient Active Problem List   Diagnosis Date Noted  . Asthma attack 04/11/2014  . Asthma exacerbation 02/16/2014    History reviewed. No pertinent surgical history.     Home Medications    Prior to Admission medications   Medication Sig Start Date End Date Taking? Authorizing Provider  albuterol (PROVENTIL HFA;VENTOLIN HFA) 108 (90 BASE) MCG/ACT inhaler Inhale 2 puffs into the lungs every 4 (four) hours as needed for wheezing or shortness of breath. 02/18/14   Burns, Tinnie [name], MD  albuterol (PROVENTIL HFA;VENTOLIN HFA) 108 (90 BASE) MCG/ACT inhaler Inhale 2 puffs into the lungs every 4 (four) hours as needed for wheezing or shortness of breath. 04/13/14   Leroy Sea, MD  azithromycin (ZITHROMAX) 250 MG tablet Take 1 tablet (250 mg total) by  mouth daily. 04/13/14   Leroy Sea, MD  Cetirizine HCl (ZYRTEC ALLERGY) 10 MG CAPS Take 1 capsule (10 mg total) by mouth at bedtime and may repeat dose one time if needed. 04/13/14   Leroy Sea, MD  loratadine (CLARITIN) 10 MG tablet Take 1 tablet (10 mg total) by mouth daily. 02/18/14   Burns, Tinnie [name], MD  mometasone-formoterol (DULERA) 200-5 MCG/ACT AERO Inhale 2 puffs into the lungs 2 (two) times daily. 02/18/14   Burns, Tinnie [name], MD  predniSONE (DELTASONE) 5 MG tablet Label  & dispense according to the schedule below. 10 Pills PO for 3 days then, 8 Pills PO for 3 days, 6 Pills PO for 3 days, 4 Pills PO for 3 days, 2 Pills PO for 3 days, 1 Pills PO for 3 days, 1/2 Pill  PO for 3 days then STOP. Total 95 pills. 04/13/14   Leroy Sea, MD    Family History Family History  Problem Relation Age of Onset  . Diabetes Mother   . Asthma Mother   . Heart attack Father   . Asthma Sister     Social History Social History   Tobacco Use  . Smoking status: Former Smoker    Start date: 05/21/1991    Last attempt to quit: 09/09/2013    Years since quitting: 3.7  . Smokeless tobacco: Never Used  . Tobacco comment: smoking black and milds 1 a day  Substance  Use Topics  . Alcohol use: No  . Drug use: Yes    Comment: marijuana occasionally     Allergies   Shellfish allergy   Review of Systems Review of Systems  Constitutional: Negative for chills and fever.  HENT: Positive for rhinorrhea. Negative for congestion, ear pain and sore throat.   Eyes: Negative for pain and visual disturbance.  Respiratory: Positive for cough, shortness of breath and wheezing.   Cardiovascular: Positive for chest pain. Negative for palpitations and leg swelling.  Gastrointestinal: Negative for abdominal pain, constipation, diarrhea, nausea and vomiting.  Genitourinary: Negative for dysuria and hematuria.  Musculoskeletal: Negative for arthralgias and back pain.  Skin: Negative for color change  and rash.  Neurological: Negative for seizures and syncope.  All other systems reviewed and are negative.    Physical Exam Updated Vital Signs BP 132/77 (BP Location: Right Arm)   Pulse 71   Temp 98.3 F (36.8 C) (Oral)   Resp 18   SpO2 100%   Physical Exam  Constitutional: He appears well-developed and well-nourished.  HENT:  Head: Normocephalic and atraumatic.  Right Ear: External ear normal.  Left Ear: External ear normal.  Mouth/Throat: Oropharynx is clear and moist.  Nasal turbinates in the right nares are enlarged and boggy.  Bilateral TMs are without erythema or bulging.  No fluid noted behind bilateral TMs.  Eyes: Conjunctivae and EOM are normal. Pupils are equal, round, and reactive to light.  Neck: Neck supple.  Cardiovascular: Normal rate and regular rhythm.  No murmur heard. Pulmonary/Chest: Effort normal. No respiratory distress.  Speaking in full sentences.  Expiratory wheezes in all lung fields but good air movement bilaterally.  Chest pain is not reproducible with palpation or range of motion of the extremities.  Abdominal: Soft. Bowel sounds are normal. There is no tenderness.  Musculoskeletal: He exhibits no edema.  No bilateral calf tenderness, erythema, pain.  Distal pulses intact.  Lymphadenopathy:    He has no cervical adenopathy.  Neurological: He is alert.  Skin: Skin is warm and dry.  Psychiatric: He has a normal mood and affect.  Nursing note and vitals reviewed.    ED Treatments / Results  Labs (all labs ordered are listed, but only abnormal results are displayed) Labs Reviewed  I-STAT TROPONIN, ED    EKG  EKG Interpretation None       Radiology Dg Chest 2 View  Result Date: 05/23/2017 CLINICAL DATA:  Chest pain and shortness of breath for 3 days. EXAM: CHEST  2 VIEW COMPARISON:  04/11/2014 FINDINGS: Cardiomediastinal silhouette is normal. Mediastinal contours appear intact. There is no evidence of focal airspace consolidation,  pleural effusion or pneumothorax. Osseous structures are without acute abnormality. Soft tissues are grossly normal. IMPRESSION: No active cardiopulmonary disease. Electronically Signed   By: Ted Mcalpine M.D.   On: 05/23/2017 10:17    Procedures Procedures (including critical care time)  Medications Ordered in ED Medications  albuterol (PROVENTIL) (2.5 MG/3ML) 0.083% nebulizer solution 5 mg (5 mg Nebulization Given 05/23/17 0850)  ipratropium-albuterol (DUONEB) 0.5-2.5 (3) MG/3ML nebulizer solution 3 mL (3 mLs Nebulization Given 05/23/17 0952)  acetaminophen (TYLENOL) tablet 650 mg (650 mg Oral Given 05/23/17 1035)     Initial Impression / Assessment and Plan / ED Course  I have reviewed the triage vital signs and the nursing notes.  Pertinent labs & imaging results that were available during my care of the patient were reviewed by me and considered in my medical decision making (see chart  for details).   11:10 Rechecked patient.  His SOB feels improved after the DuoNeb .  Chest pain has completely resolved.  Repeat lung exam showed no wheezing.  Clear to auscultation bilaterally.  Discussed results of the chest x-ray, EKG, and plan for discharge with albuterol inhaler.  Advised primary care follow-up and return precautions given.  All questions answered.  Final Clinical Impressions(s) / ED Diagnoses   Final diagnoses:  Exacerbation of asthma, unspecified asthma severity, unspecified whether persistent  Chest pain, unspecified type   42 year old male history of asthma presenting with worsening dyspnea and chest pain for the last 3-4 days.  Chest x-ray negative for pneumonia.  EKG with no ST elevation or depressions.  Trop Negative. SOB and chest pain completely improved after DuoNeb. PERC negative. Discharged with albuterol inhaler and outpatient follow-up for the next 3-5 days.  Return precautions given.  ED Discharge Orders    None       Rayne DuCouture, Thi Sisemore S, PA-C 05/23/17  1555    Jacalyn LefevreHaviland, Julie, MD 05/23/17 [phone]

## 2017-05-23 NOTE — ED Triage Notes (Signed)
Pt reports increase in cough and asthma symptoms x 2 days. Is out of his inhaler. No acute resp distress is noted at triage, spo2 97%.

## 2017-05-23 NOTE — ED Notes (Signed)
Called name three times, no answer.

## 2017-06-03 ENCOUNTER — Encounter (HOSPITAL_COMMUNITY): Payer: Self-pay | Admitting: Emergency Medicine

## 2017-06-03 ENCOUNTER — Other Ambulatory Visit: Payer: Self-pay

## 2017-06-03 ENCOUNTER — Emergency Department (HOSPITAL_COMMUNITY)
Admission: EM | Admit: 2017-06-03 | Discharge: 2017-06-03 | Disposition: A | Discharge status: Home / Self Care | Payer: Self-pay | Attending: Emergency Medicine | Admitting: Emergency Medicine

## 2017-06-03 ENCOUNTER — Emergency Department (HOSPITAL_COMMUNITY): Payer: Self-pay

## 2017-06-03 DIAGNOSIS — Z91013 Allergy to seafood: Secondary | ICD-10-CM | POA: Insufficient documentation

## 2017-06-03 DIAGNOSIS — J4521 Mild intermittent asthma with (acute) exacerbation: Secondary | ICD-10-CM | POA: Insufficient documentation

## 2017-06-03 DIAGNOSIS — Z79899 Other long term (current) drug therapy: Secondary | ICD-10-CM | POA: Insufficient documentation

## 2017-06-03 DIAGNOSIS — F17290 Nicotine dependence, other tobacco product, uncomplicated: Secondary | ICD-10-CM | POA: Insufficient documentation

## 2017-06-03 LAB — CBC WITH DIFFERENTIAL/PLATELET
Basophils Absolute: 0 10*3/uL (ref 0.0–0.1)
Basophils Relative: 0 %
EOS PCT: 3 %
Eosinophils Absolute: 0.3 10*3/uL (ref 0.0–0.7)
HCT: 50.7 % (ref 39.0–52.0)
Hemoglobin: 17.7 g/dL — ABNORMAL HIGH (ref 13.0–17.0)
LYMPHS ABS: 4.5 10*3/uL — AB (ref 0.7–4.0)
LYMPHS PCT: 38 %
MCH: 30.2 pg (ref 26.0–34.0)
MCHC: 34.9 g/dL (ref 30.0–36.0)
MCV: 86.4 fL (ref 78.0–100.0)
MONO ABS: 0.7 10*3/uL (ref 0.1–1.0)
MONOS PCT: 6 %
Neutro Abs: 6.4 10*3/uL (ref 1.7–7.7)
Neutrophils Relative %: 53 %
Platelets: 275 10*3/uL (ref 150–400)
RBC: 5.87 MIL/uL — ABNORMAL HIGH (ref 4.22–5.81)
RDW: 13.8 % (ref 11.5–15.5)
WBC: 11.9 10*3/uL — ABNORMAL HIGH (ref 4.0–10.5)

## 2017-06-03 LAB — BASIC METABOLIC PANEL
Anion gap: 9 (ref 5–15)
BUN: 7 mg/dL (ref 6–20)
CHLORIDE: 106 mmol/L (ref 101–111)
CO2: 23 mmol/L (ref 22–32)
Calcium: 9.1 mg/dL (ref 8.9–10.3)
Creatinine, Ser: 1.3 mg/dL — ABNORMAL HIGH (ref 0.61–1.24)
GFR calc Af Amer: >60 mL/min (ref >60)
GFR calc non Af Amer: >60 mL/min (ref >60)
GLUCOSE: 94 mg/dL (ref 65–99)
POTASSIUM: 3.7 mmol/L (ref 3.5–5.1)
Sodium: 138 mmol/L (ref 135–145)

## 2017-06-03 MED ORDER — ALBUTEROL SULFATE HFA 108 (90 BASE) MCG/ACT IN AERS
1.0000 | INHALATION_SPRAY | RESPIRATORY_TRACT | Status: DC | PRN
  Administered 2017-06-03: 2 via RESPIRATORY_TRACT
  Filled 2017-06-03: qty 6.7

## 2017-06-03 MED ORDER — IPRATROPIUM-ALBUTEROL 0.5-2.5 (3) MG/3ML IN SOLN
3.0000 mL | Freq: Once | RESPIRATORY_TRACT | Status: AC
  Administered 2017-06-03: 3 mL via RESPIRATORY_TRACT
  Filled 2017-06-03: qty 3

## 2017-06-03 MED ORDER — ALBUTEROL SULFATE (2.5 MG/3ML) 0.083% IN NEBU
2.5000 mg | INHALATION_SOLUTION | Freq: Once | RESPIRATORY_TRACT | Status: AC
  Administered 2017-06-03: 2.5 mg via RESPIRATORY_TRACT
  Filled 2017-06-03: qty 3

## 2017-06-03 MED ORDER — PREDNISONE 20 MG PO TABS: 40.0000 mg | ORAL_TABLET | Freq: Every day | ORAL | 0 refills | Status: DC

## 2017-06-03 MED ORDER — PREDNISONE 20 MG PO TABS
60.0000 mg | ORAL_TABLET | Freq: Once | ORAL | Status: AC
  Administered 2017-06-03: 60 mg via ORAL
  Filled 2017-06-03: qty 3

## 2017-06-03 NOTE — Discharge Instructions (Signed)
Please read attached information. If you experience any new or worsening signs or symptoms please return to the emergency room for evaluation. Please follow-up with your primary care provider or specialist as discussed. Please use medication prescribed only as directed and discontinue taking if you have any concerning signs or symptoms.   °

## 2017-06-03 NOTE — ED Triage Notes (Signed)
Pt c/o increased shortness of breath x's 2 days.  Pt st's he lost his inhaler

## 2017-06-03 NOTE — ED Provider Notes (Signed)
[name] EMERGENCY DEPARTMENT Provider Note   CSN: [id number] Arrival date & time: 06/03/17  1634     History   Chief Complaint Chief Complaint  Patient presents with  . Asthma    HPI Jacob Ho is a 42 y.o. male.  HPI   42 year old male presents today with complaints of asthma exacerbation.  Patient notes a history of asthma, reports that he was seen last week but lost the inhaler he was given.  He notes symptoms gradually progressed with wheezing and shortness of breath typical of asthma exacerbations.  Patient reports he felt hot yesterday, no fever today.  He denies any chest pain, lower extremity swelling or edema.  Patient reports he does smoke.  Past Medical History:  Diagnosis Date  . Asthma     Patient Active Problem List   Diagnosis Date Noted  . Asthma attack 04/11/2014  . Asthma exacerbation 02/16/2014    History reviewed. No pertinent surgical history.     Home Medications    Prior to Admission medications   Medication Sig Start Date End Date Taking? Authorizing Provider  albuterol (PROVENTIL HFA;VENTOLIN HFA) 108 (90 BASE) MCG/ACT inhaler Inhale 2 puffs into the lungs every 4 (four) hours as needed for wheezing or shortness of breath. 02/18/14   Burns, Tinnie [name], MD  albuterol (PROVENTIL HFA;VENTOLIN HFA) 108 (90 BASE) MCG/ACT inhaler Inhale 2 puffs into the lungs every 4 (four) hours as needed for wheezing or shortness of breath. 04/13/14   Leroy Sea, MD  azithromycin (ZITHROMAX) 250 MG tablet Take 1 tablet (250 mg total) by mouth daily. 04/13/14   Leroy Sea, MD  Cetirizine HCl (ZYRTEC ALLERGY) 10 MG CAPS Take 1 capsule (10 mg total) by mouth at bedtime and may repeat dose one time if needed. 04/13/14   Leroy Sea, MD  loratadine (CLARITIN) 10 MG tablet Take 1 tablet (10 mg total) by mouth daily. 02/18/14   Burns, Tinnie [name], MD  mometasone-formoterol (DULERA) 200-5 MCG/ACT AERO Inhale 2 puffs into the lungs 2  (two) times daily. 02/18/14   Burns, Tinnie [name], MD  predniSONE (DELTASONE) 20 MG tablet Take 2 tablets (40 mg total) by mouth daily. 06/03/17   Eyvonne Mechanic, PA-C    Family History Family History  Problem Relation Age of Onset  . Diabetes Mother   . Asthma Mother   . Heart attack Father   . Asthma Sister     Social History Social History   Tobacco Use  . Smoking status: Former Smoker    Start date: 05/21/1991    Last attempt to quit: 09/09/2013    Years since quitting: 3.7  . Smokeless tobacco: Never Used  . Tobacco comment: smoking black and milds 1 a day  Substance Use Topics  . Alcohol use: No  . Drug use: Yes    Comment: marijuana occasionally     Allergies   Shellfish allergy   Review of Systems Review of Systems  All other systems reviewed and are negative.    Physical Exam Updated Vital Signs BP (!) 154/103 (BP Location: Right Arm)   Pulse 86   Temp 98.7 F (37.1 C) (Oral)   Resp 20   Ht 5\' 9"  (1.753 m)   Wt 85.7 kg (189 lb)   SpO2 97%   BMI 27.91 kg/m   Physical Exam  Constitutional: He is oriented to person, place, and time. He appears well-developed and well-nourished.  HENT:  Head: Normocephalic and atraumatic.  Eyes: Conjunctivae  are normal. Pupils are equal, round, and reactive to light. Right eye exhibits no discharge. Left eye exhibits no discharge. No scleral icterus.  Neck: Normal range of motion. No JVD present. No tracheal deviation present.  Pulmonary/Chest: Effort normal. No stridor.  Bilateral expiratory wheeze noted no crackles, no respiratory distress  Musculoskeletal:  No edema  Neurological: He is alert and oriented to person, place, and time. Coordination normal.  Psychiatric: He has a normal mood and affect. His behavior is normal. Judgment and thought content normal.  Nursing note and vitals reviewed.    ED Treatments / Results  Labs (all labs ordered are listed, but only abnormal results are displayed) Labs Reviewed    BASIC METABOLIC PANEL - Abnormal; Notable for the following components:      Result Value   Creatinine, Ser 1.30 (*)    All other components within normal limits  CBC WITH DIFFERENTIAL/PLATELET - Abnormal; Notable for the following components:   WBC 11.9 (*)    RBC 5.87 (*)    Hemoglobin 17.7 (*)    Lymphs Abs 4.5 (*)    All other components within normal limits    EKG  EKG Interpretation None      EKG shows normal sinus rhythm, no ST elevation or depression, no significant T wave changes Radiology Dg Chest 2 View  Result Date: 06/03/2017 CLINICAL DATA:  Shortness of breath EXAM: CHEST  2 VIEW COMPARISON:  Chest radiograph 05/23/2017 FINDINGS: The heart size and mediastinal contours are within normal limits. Both lungs are clear. The visualized skeletal structures are unremarkable. IMPRESSION: Normal chest. Electronically Signed   By: Deatra Robinson M.D.   On: 06/03/2017 17:22    Procedures Procedures (including critical care time)  Medications Ordered in ED Medications  predniSONE (DELTASONE) tablet 60 mg (not administered)  albuterol (PROVENTIL HFA;VENTOLIN HFA) 108 (90 Base) MCG/ACT inhaler 1-2 puff (not administered)  albuterol (PROVENTIL) (2.5 MG/3ML) 0.083% nebulizer solution 2.5 mg (2.5 mg Nebulization Given 06/03/17 1649)  ipratropium-albuterol (DUONEB) 0.5-2.5 (3) MG/3ML nebulizer solution 3 mL (3 mLs Nebulization Given 06/03/17 1846)     Initial Impression / Assessment and Plan / ED Course  I have reviewed the triage vital signs and the nursing notes.  Pertinent labs & imaging results that were available during my care of the patient were reviewed by me and considered in my medical decision making (see chart for details).       Final Clinical Impressions(s) / ED Diagnoses   Final diagnoses:  Mild intermittent asthma with exacerbation    Labs: BMP, CBC  Imaging: DG chest 2 view  Consults:  Therapeutics:  Discharge Meds:   Assessment/Plan:    42 year old male presents today with asthma exacerbatio  He is well-appearing in no acute distress.  He has no signs of infectious etiology.  Patient was given a breathing treatment, I assessed him afterwards with continued wheeze, second breathing treatment improved his symptoms.  I feel patient will benefit from steroids given his recurrence of exacerbations.  Patient reports he has been hospitalized in the past I do not feel this is necessary today.  Patient discharged home with inhaler, prednisone, strict return precautions and follow-up information.  He verbalized understanding and agreement to today's plan had no further questions or concerns.  ED Discharge Orders        Ordered    predniSONE (DELTASONE) 20 MG tablet  Daily     06/03/17 1923       Eyvonne Mechanic, Cordelia Poche 06/03/17 1923  Arby BarrettePfeiffer, Marcy, MD 06/12/17 1319

## 2017-07-09 ENCOUNTER — Emergency Department (HOSPITAL_COMMUNITY)
Admission: EM | Admit: 2017-07-09 | Discharge: 2017-07-09 | Disposition: A | Discharge status: Home / Self Care | Payer: Self-pay | Attending: Emergency Medicine | Admitting: Emergency Medicine

## 2017-07-09 ENCOUNTER — Emergency Department (HOSPITAL_COMMUNITY): Payer: Self-pay

## 2017-07-09 ENCOUNTER — Encounter (HOSPITAL_COMMUNITY): Payer: Self-pay | Admitting: Emergency Medicine

## 2017-07-09 DIAGNOSIS — J4521 Mild intermittent asthma with (acute) exacerbation: Secondary | ICD-10-CM | POA: Insufficient documentation

## 2017-07-09 DIAGNOSIS — J45909 Unspecified asthma, uncomplicated: Secondary | ICD-10-CM | POA: Insufficient documentation

## 2017-07-09 DIAGNOSIS — Z87891 Personal history of nicotine dependence: Secondary | ICD-10-CM | POA: Insufficient documentation

## 2017-07-09 DIAGNOSIS — F1210 Cannabis abuse, uncomplicated: Secondary | ICD-10-CM | POA: Insufficient documentation

## 2017-07-09 MED ORDER — PREDNISONE 20 MG PO TABS
60.0000 mg | ORAL_TABLET | Freq: Once | ORAL | Status: AC
Start: 2017-07-09 — End: 2017-07-09
  Administered 2017-07-09: 60 mg via ORAL
  Filled 2017-07-09: qty 3

## 2017-07-09 MED ORDER — MOMETASONE FURO-FORMOTEROL FUM 200-5 MCG/ACT IN AERO: 2.0000 | INHALATION_SPRAY | Freq: Two times a day (BID) | RESPIRATORY_TRACT | 6 refills | Status: DC

## 2017-07-09 MED ORDER — IPRATROPIUM-ALBUTEROL 0.5-2.5 (3) MG/3ML IN SOLN
3.0000 mL | Freq: Once | RESPIRATORY_TRACT | Status: AC
  Administered 2017-07-09: 3 mL via RESPIRATORY_TRACT
  Filled 2017-07-09: qty 3

## 2017-07-09 MED ORDER — ALBUTEROL SULFATE (5 MG/ML) 0.5% IN NEBU: 2.5000 mg | INHALATION_SOLUTION | Freq: Four times a day (QID) | RESPIRATORY_TRACT | 12 refills | Status: DC | PRN

## 2017-07-09 MED ORDER — ALBUTEROL SULFATE (2.5 MG/3ML) 0.083% IN NEBU
5.0000 mg | INHALATION_SOLUTION | Freq: Once | RESPIRATORY_TRACT | Status: AC
  Administered 2017-07-09: 5 mg via RESPIRATORY_TRACT
  Filled 2017-07-09: qty 6

## 2017-07-09 MED ORDER — IPRATROPIUM BROMIDE 0.02 % IN SOLN
0.5000 mg | Freq: Once | RESPIRATORY_TRACT | Status: AC
  Administered 2017-07-09: 0.5 mg via RESPIRATORY_TRACT
  Filled 2017-07-09: qty 2.5

## 2017-07-09 MED ORDER — ALBUTEROL SULFATE HFA 108 (90 BASE) MCG/ACT IN AERS
1.0000 | INHALATION_SPRAY | Freq: Four times a day (QID) | RESPIRATORY_TRACT | Status: DC | PRN
  Administered 2017-07-09: 2 via RESPIRATORY_TRACT
  Filled 2017-07-09: qty 6.7

## 2017-07-09 MED ORDER — ALBUTEROL SULFATE (2.5 MG/3ML) 0.083% IN NEBU: 5.0000 mg | INHALATION_SOLUTION | Freq: Once | RESPIRATORY_TRACT | Status: DC

## 2017-07-09 NOTE — ED Provider Notes (Signed)
[name] EMERGENCY DEPARTMENT Provider Note   CSN: [id number] Arrival date & time: 07/09/17  1430     History   Chief Complaint Chief Complaint  Patient presents with  . Asthma    HPI Jacob Ho is a 42 y.o. male.  HPI   42 year old male with a history of asthma presents today with complaints of asthma exacerbation.  Patient notes over the last week he has had intermittent wheezing that is worsened over last night.  He notes that he has been using his albuterol inhaler at home with little significant improvement.  He also notes he has been using his daily steroid with out improvement in acute exacerbation.  Patient denies any significant chest pain, denies any fever or chills.  Patient reports this is typical of asthma exacerbation.  Past Medical History:  Diagnosis Date  . Asthma     Patient Active Problem List   Diagnosis Date Noted  . Asthma attack 04/11/2014  . Asthma exacerbation 02/16/2014    History reviewed. No pertinent surgical history.     Home Medications    Prior to Admission medications   Medication Sig Start Date End Date Taking? Authorizing Provider  guaiFENesin (MUCINEX) 600 MG 12 hr tablet Take 600 mg by mouth 2 (two) times daily as needed.   Yes [provider]  albuterol (PROVENTIL) (5 MG/ML) 0.5% nebulizer solution Take 0.5 mLs (2.5 mg total) by nebulization every 6 (six) hours as needed for wheezing or shortness of breath. 07/09/17   Judge Duque, Tinnie [name], PA-C  azithromycin (ZITHROMAX) 250 MG tablet Take 1 tablet (250 mg total) by mouth daily. Patient not taking: Reported on 07/09/2017 04/13/14   Leroy Sea, MD  loratadine (CLARITIN) 10 MG tablet Take 1 tablet (10 mg total) by mouth daily. Patient not taking: Reported on 07/09/2017 02/18/14   Servando Snare, MD  mometasone-formoterol (DULERA) 200-5 MCG/ACT AERO Inhale 2 puffs into the lungs 2 (two) times daily. 07/09/17   [name] Clouatre, Tinnie [name], PA-C  predniSONE  (DELTASONE) 20 MG tablet Take 2 tablets (40 mg total) by mouth daily. Patient not taking: Reported on 07/09/2017 06/03/17   Eyvonne Mechanic, PA-C    Family History Family History  Problem Relation Age of Onset  . Diabetes Mother   . Asthma Mother   . Heart attack Father   . Asthma Sister     Social History Social History   Tobacco Use  . Smoking status: Former Smoker    Start date: 05/21/1991    Last attempt to quit: 09/09/2013    Years since quitting: 3.8  . Smokeless tobacco: Never Used  . Tobacco comment: smoking black and milds 1 a day  Substance Use Topics  . Alcohol use: No  . Drug use: Yes    Comment: marijuana occasionally     Allergies   Shellfish allergy   Review of Systems Review of Systems  All other systems reviewed and are negative.    Physical Exam Updated Vital Signs BP (!) 140/99   Pulse 88   Temp 98.2 F (36.8 C) (Oral)   Resp 18   SpO2 98%   Physical Exam  Constitutional: He is oriented to person, place, and time. He appears well-developed and well-nourished.  HENT:  Head: Normocephalic and atraumatic.  Eyes: Conjunctivae are normal. Pupils are equal, round, and reactive to light. Right eye exhibits no discharge. Left eye exhibits no discharge. No scleral icterus.  Neck: Normal range of motion. No JVD present. No tracheal deviation present.  Cardiovascular: Normal rate, regular rhythm, normal heart sounds and intact distal pulses.  Pulmonary/Chest: Effort normal. No stridor.  Minor expiratory wheeze bilateral  Neurological: He is alert and oriented to person, place, and time. Coordination normal.  Psychiatric: He has a normal mood and affect. His behavior is normal. Judgment and thought content normal.  Nursing note and vitals reviewed.    ED Treatments / Results  Labs (all labs ordered are listed, but only abnormal results are displayed) Labs Reviewed - No data to display  EKG  EKG Interpretation None       Radiology Dg Chest  2 View  Result Date: 07/09/2017 CLINICAL DATA:  Asthma exacerbation. EXAM: CHEST  2 VIEW COMPARISON:  Two-view chest x-ray 06/03/2017 FINDINGS: The heart size and mediastinal contours are within normal limits. Both lungs are clear. The visualized skeletal structures are unremarkable. IMPRESSION: Negative two view chest x-ray Electronically Signed   By: Marin Robertshristopher  Mattern M.D.   On: 07/09/2017 15:39    Procedures Procedures (including critical care time)  Medications Ordered in ED Medications  albuterol (PROVENTIL HFA;VENTOLIN HFA) 108 (90 Base) MCG/ACT inhaler 1-2 puff (not administered)  albuterol (PROVENTIL) (2.5 MG/3ML) 0.083% nebulizer solution 5 mg (5 mg Nebulization Given 07/09/17 1452)  predniSONE (DELTASONE) tablet 60 mg (60 mg Oral Given 07/09/17 1452)  ipratropium (ATROVENT) nebulizer solution 0.5 mg (0.5 mg Nebulization Given 07/09/17 1452)  ipratropium-albuterol (DUONEB) 0.5-2.5 (3) MG/3ML nebulizer solution 3 mL (3 mLs Nebulization Given 07/09/17 1647)     Initial Impression / Assessment and Plan / ED Course  I have reviewed the triage vital signs and the nursing notes.  Pertinent labs & imaging results that were available during my care of the patient were reviewed by me and considered in my medical decision making (see chart for details).     Final Clinical Impressions(s) / ED Diagnoses   Final diagnoses:  Mild intermittent asthma with exacerbation    Patient's presentation is most consistent with asthma exacerbation.  He received a breathing treatment prior to my arrival which significant improved his symptoms.  He reports very minimal wheeze at this time.  Patient given a second breathing treatment which again further improved his condition.  He is well-appearing in no acute distress with reassuring vital signs, reassuring physical exam.  Patient discharged with inhalers, follow-up information and strict return precautions.  He verbalized understanding and agreement to  this plan.  ED Discharge Orders        Ordered    mometasone-formoterol [facility](DULERA) 200-5 MCG/ACT AERO  2 times daily     07/09/17 1710    albuterol (PROVENTIL) (5 MG/ML) 0.5% nebulizer solution  Every 6 hours PRN     07/09/17 1710       Eyvonne MechanicHedges, Suede [place], PA-C 07/09/17 1712    Cathren LaineSteinl, Kevin, MD 07/09/17 2140

## 2017-07-09 NOTE — Discharge Instructions (Signed)
Please read attached information. If you experience any new or worsening signs or symptoms please return to the emergency room for evaluation. Please follow-up with your primary care provider or specialist as discussed. Please use medication prescribed only as directed and discontinue taking if you have any concerning signs or symptoms.   °

## 2017-07-09 NOTE — ED Triage Notes (Signed)
Pt presnts to ED for assessment of asthma flare up starting this morning, patient has been out of his rescue inhaler for 2 days now.  Hx of smoking.

## 2017-07-09 NOTE — ED Notes (Signed)
Patient verbalized understanding of discharge instructions and denies any further needs or questions at this time. VS stable. Patient ambulatory with steady gait.  

## 2017-07-16 MED FILL — DULERA 200 MCG/5 MCG INH: 200-5 | 30 days supply | Qty: 13 | Fill #0

## 2017-12-15 MED FILL — DULERA 200 MCG/5 MCG INH: 200-5 | 30 days supply | Qty: 13 | Fill #1

## 2017-12-27 ENCOUNTER — Ambulatory Visit (HOSPITAL_COMMUNITY)
Admission: EM | Admit: 2017-12-27 | Discharge: 2017-12-27 | Disposition: A | Discharge status: Home / Self Care | Payer: Self-pay | Attending: Internal Medicine | Admitting: Internal Medicine

## 2017-12-27 ENCOUNTER — Other Ambulatory Visit: Payer: Self-pay

## 2017-12-27 ENCOUNTER — Encounter (HOSPITAL_COMMUNITY): Payer: Self-pay | Admitting: *Deleted

## 2017-12-27 DIAGNOSIS — L739 Follicular disorder, unspecified: Secondary | ICD-10-CM

## 2017-12-27 DIAGNOSIS — T7840XA Allergy, unspecified, initial encounter: Secondary | ICD-10-CM

## 2017-12-27 MED ORDER — DOXYCYCLINE HYCLATE 100 MG PO CAPS: 100.0000 mg | ORAL_CAPSULE | Freq: Two times a day (BID) | ORAL | 0 refills | Status: DC

## 2017-12-27 MED ORDER — CETIRIZINE HCL 10 MG PO CAPS: 10.0000 mg | ORAL_CAPSULE | Freq: Two times a day (BID) | ORAL | 0 refills | Status: DC

## 2017-12-27 NOTE — ED Provider Notes (Signed)
[facility]    CSN: [id number] Arrival date & time: 12/27/17  1035     History   Chief Complaint Chief Complaint  Patient presents with  . Rash    HPI Jacob Ho is a 42 y.o. male history of asthma presenting today for evaluation of rash.  Patient said that he recently used a new beard dye, and beginning yesterday he began to feel itching and irritation throughout his beard.  He also feels like his beard is swollen.  Has noted some drainage and multiple bumps throughout his beard.  Denies using any other beard products.  Denies previous issues with folliculitis/bumps from shaving.  He has not applied anything to his.  Since this.  Denies difficulty breathing or throat swelling.  HPI  Past Medical History:  Diagnosis Date  . Asthma     Patient Active Problem List   Diagnosis Date Noted  . Asthma attack 04/11/2014  . Asthma exacerbation 02/16/2014    History reviewed. No pertinent surgical history.     Home Medications    Prior to Admission medications   Medication Sig Start Date End Date Taking? Authorizing Provider  mometasone-formoterol (DULERA) 200-5 MCG/ACT AERO Inhale 2 puffs into the lungs 2 (two) times daily. 07/09/17  Yes Hedges, Tinnie GensJeffrey, PA-C  Cetirizine HCl 10 MG CAPS Take 1 capsule (10 mg total) by mouth 2 (two) times daily for 4 days. 12/27/17 12/31/17  Jesiah Grismer C, PA-C  doxycycline (VIBRAMYCIN) 100 MG capsule Take 1 capsule (100 mg total) by mouth 2 (two) times daily. 12/27/17   Abiha Lukehart, Junius CreamerHallie C, PA-C    Family History Family History  Problem Relation Age of Onset  . Diabetes Mother   . Asthma Mother   . Heart attack Father   . Asthma Sister     Social History Social History   Tobacco Use  . Smoking status: Former Smoker    Start date: 05/21/1991    Last attempt to quit: 09/09/2013    Years since quitting: 4.3  . Smokeless tobacco: Never Used  . Tobacco comment: smoking black and milds 1 a day  Substance Use Topics  .  Alcohol use: No  . Drug use: Yes    Comment: marijuana occasionally     Allergies   Shellfish allergy and Iodine   Review of Systems Review of Systems  Constitutional: Negative for fatigue and fever.  HENT: Negative for sore throat and trouble swallowing.   Eyes: Negative for redness, itching and visual disturbance.  Respiratory: Negative for shortness of breath.   Cardiovascular: Negative for chest pain and leg swelling.  Gastrointestinal: Negative for nausea and vomiting.  Musculoskeletal: Negative for arthralgias and myalgias.  Skin: Positive for color change and rash. Negative for wound.  Neurological: Negative for dizziness, syncope, weakness, light-headedness and headaches.     Physical Exam Triage Vital Signs ED Triage Vitals [12/27/17 1201]  Enc Vitals Group     BP (!) 144/82     Pulse Rate 69     Resp 16     Temp 98.3 F (36.8 C)     Temp src      SpO2 100 %     Weight      Height      Head Circumference      Peak Flow      Pain Score 6     Pain Loc      Pain Edu?      Excl. in GC?    No data  found.  Updated Vital Signs BP (!) 144/82   Pulse 69   Temp 98.3 F (36.8 C)   Resp 16   SpO2 100%   Visual Acuity Right Eye Distance:   Left Eye Distance:   Bilateral Distance:    Right Eye Near:   Left Eye Near:    Bilateral Near:     Physical Exam  Constitutional: He appears well-developed and well-nourished.  HENT:  Head: Normocephalic and atraumatic.  Mouth/Throat: Oropharynx is clear and moist.  Oral mucosa pink and moist, no tonsillar enlargement or exudate. Posterior pharynx patent and nonerythematous, no uvula deviation or swelling. Normal phonation.  Eyes: Conjunctivae are normal.  Neck: Neck supple.  Cardiovascular: Normal rate and regular rhythm.  No murmur heard. Pulmonary/Chest: Effort normal and breath sounds normal. No respiratory distress.  Abdominal: Soft. There is no tenderness.  Musculoskeletal: He exhibits no edema.    Neurological: He is alert.  Skin: Skin is warm and dry.  Shortcut beard, slight papules with clear drainage diffusely throughout.,  Slight swelling  Psychiatric: He has a normal mood and affect.  Nursing note and vitals reviewed.    UC Treatments / Results  Labs (all labs ordered are listed, but only abnormal results are displayed) Labs Reviewed - No data to display  EKG None  Radiology No results found.  Procedures Procedures (including critical care time)  Medications Ordered in UC Medications - No data to display  Initial Impression / Assessment and Plan / UC Course  I have reviewed the triage vital signs and the nursing notes.  Pertinent labs & imaging results that were available during my care of the patient were reviewed by me and considered in my medical decision making (see chart for details).     Patient with allergic reaction versus folliculitis.  Will treat for both with doxycycline as well as antihistamines.  Warm compresses to help with swelling and discomfort.  Expect gradual self resolution if allergic.Discussed strict return precautions. Patient verbalized understanding and is agreeable with plan.  Final Clinical Impressions(s) / UC Diagnoses   Final diagnoses:  Folliculitis  Allergic reaction, initial encounter     Discharge Instructions     Please begin taking doxycycline twice daily over the next week  To treat allergic reaction/itching please take Zyrtec twice daily, alternatively you may use Benadryl at bedtime  Warm compresses to barrier to help with swelling and discomfort  Please follow-up if symptoms worsening, not improving in the next 3 to 4 days, developing worsening swelling, difficulty breathing   ED Prescriptions    Medication Sig Dispense Auth. Provider   doxycycline (VIBRAMYCIN) 100 MG capsule Take 1 capsule (100 mg total) by mouth 2 (two) times daily. 20 capsule Adelaine Roppolo C, PA-C   Cetirizine HCl 10 MG CAPS Take 1  capsule (10 mg total) by mouth 2 (two) times daily for 4 days. 20 capsule Sweet Jarvis C, PA-C     Controlled Substance Prescriptions [place] Controlled Substance Registry consulted? Not Applicable   Lew Dawes, [place] 12/27/17 1313

## 2017-12-27 NOTE — ED Triage Notes (Addendum)
C/O facial swelling throughout beard with some drainage since yesterday.  Feels he may be reacting to beard oil.

## 2017-12-27 NOTE — Discharge Instructions (Signed)
Please begin taking doxycycline twice daily over the next week  To treat allergic reaction/itching please take Zyrtec twice daily, alternatively you may use Benadryl at bedtime  Warm compresses to barrier to help with swelling and discomfort  Please follow-up if symptoms worsening, not improving in the next 3 to 4 days, developing worsening swelling, difficulty breathing

## 2017-12-31 ENCOUNTER — Emergency Department (HOSPITAL_COMMUNITY)
Admission: EM | Admit: 2017-12-31 | Discharge: 2017-12-31 | Disposition: A | Discharge status: Home / Self Care | Payer: Self-pay | Attending: Emergency Medicine | Admitting: Emergency Medicine

## 2017-12-31 ENCOUNTER — Emergency Department (HOSPITAL_COMMUNITY): Payer: Self-pay

## 2017-12-31 ENCOUNTER — Other Ambulatory Visit: Payer: Self-pay

## 2017-12-31 ENCOUNTER — Encounter (HOSPITAL_COMMUNITY): Payer: Self-pay | Admitting: Emergency Medicine

## 2017-12-31 DIAGNOSIS — R197 Diarrhea, unspecified: Secondary | ICD-10-CM | POA: Insufficient documentation

## 2017-12-31 DIAGNOSIS — R1031 Right lower quadrant pain: Secondary | ICD-10-CM | POA: Insufficient documentation

## 2017-12-31 DIAGNOSIS — F1290 Cannabis use, unspecified, uncomplicated: Secondary | ICD-10-CM | POA: Insufficient documentation

## 2017-12-31 DIAGNOSIS — R109 Unspecified abdominal pain: Secondary | ICD-10-CM

## 2017-12-31 DIAGNOSIS — Z87891 Personal history of nicotine dependence: Secondary | ICD-10-CM | POA: Insufficient documentation

## 2017-12-31 DIAGNOSIS — R1033 Periumbilical pain: Secondary | ICD-10-CM | POA: Insufficient documentation

## 2017-12-31 DIAGNOSIS — R112 Nausea with vomiting, unspecified: Secondary | ICD-10-CM | POA: Insufficient documentation

## 2017-12-31 LAB — CBC
HEMATOCRIT: 46.6 % (ref 39.0–52.0)
HEMOGLOBIN: 16.4 g/dL (ref 13.0–17.0)
MCH: 30.7 pg (ref 26.0–34.0)
MCHC: 35.2 g/dL (ref 30.0–36.0)
MCV: 87.3 fL (ref 78.0–100.0)
Platelets: 285 10*3/uL (ref 150–400)
RBC: 5.34 MIL/uL (ref 4.22–5.81)
RDW: 14.1 % (ref 11.5–15.5)
WBC: 16.8 10*3/uL — ABNORMAL HIGH (ref 4.0–10.5)

## 2017-12-31 LAB — URINALYSIS, ROUTINE W REFLEX MICROSCOPIC
BACTERIA UA: NONE SEEN
BILIRUBIN URINE: NEGATIVE
Glucose, UA: 50 mg/dL — AB
Hgb urine dipstick: NEGATIVE
KETONES UR: 20 mg/dL — AB
Leukocytes, UA: NEGATIVE
NITRITE: NEGATIVE
Protein, ur: NEGATIVE mg/dL
Specific Gravity, Urine: 1.019 (ref 1.005–1.030)
pH: 8 (ref 5.0–8.0)

## 2017-12-31 LAB — COMPREHENSIVE METABOLIC PANEL
ALT: 22 U/L (ref 0–44)
ANION GAP: 9 (ref 5–15)
AST: 22 U/L (ref 15–41)
Albumin: 3.6 g/dL (ref 3.5–5.0)
Alkaline Phosphatase: 67 U/L (ref 38–126)
BILIRUBIN TOTAL: 0.7 mg/dL (ref 0.3–1.2)
BUN: 11 mg/dL (ref 6–20)
CHLORIDE: 110 mmol/L (ref 98–111)
CO2: 24 mmol/L (ref 22–32)
Calcium: 8.9 mg/dL (ref 8.9–10.3)
Creatinine, Ser: 1.18 mg/dL (ref 0.61–1.24)
Glucose, Bld: 153 mg/dL — ABNORMAL HIGH (ref 70–99)
POTASSIUM: 3.6 mmol/L (ref 3.5–5.1)
Sodium: 143 mmol/L (ref 135–145)
TOTAL PROTEIN: 6.6 g/dL (ref 6.5–8.1)

## 2017-12-31 LAB — LIPASE, BLOOD: LIPASE: 22 U/L (ref 11–51)

## 2017-12-31 MED ORDER — MORPHINE SULFATE (PF) 4 MG/ML IV SOLN
4.0000 mg | Freq: Once | INTRAVENOUS | Status: AC
  Administered 2017-12-31: 4 mg via INTRAVENOUS
  Filled 2017-12-31: qty 1

## 2017-12-31 MED ORDER — ONDANSETRON 4 MG PO TBDP: 4.0000 mg | ORAL_TABLET | Freq: Three times a day (TID) | ORAL | 0 refills | Status: DC | PRN

## 2017-12-31 MED ORDER — ONDANSETRON HCL 4 MG/2ML IJ SOLN
4.0000 mg | Freq: Once | INTRAMUSCULAR | Status: AC | PRN
  Administered 2017-12-31: 4 mg via INTRAVENOUS
  Filled 2017-12-31: qty 2

## 2017-12-31 MED ORDER — DIPHENHYDRAMINE HCL 50 MG/ML IJ SOLN
25.0000 mg | Freq: Once | INTRAMUSCULAR | Status: AC
  Administered 2017-12-31: 25 mg via INTRAVENOUS
  Filled 2017-12-31: qty 1

## 2017-12-31 MED ORDER — METOCLOPRAMIDE HCL 5 MG/ML IJ SOLN
10.0000 mg | Freq: Once | INTRAMUSCULAR | Status: AC
  Administered 2017-12-31: 10 mg via INTRAVENOUS
  Filled 2017-12-31: qty 2

## 2017-12-31 MED ORDER — DICYCLOMINE HCL 20 MG PO TABS
20.0000 mg | ORAL_TABLET | Freq: Once | ORAL | Status: AC
  Administered 2017-12-31: 20 mg via ORAL
  Filled 2017-12-31: qty 1

## 2017-12-31 MED ORDER — SODIUM CHLORIDE 0.9 % IV BOLUS
1000.0000 mL | Freq: Once | INTRAVENOUS | Status: AC
  Administered 2017-12-31: 1000 mL via INTRAVENOUS

## 2017-12-31 NOTE — ED Provider Notes (Signed)
[facility]-EMERGENCY DEPT Provider Note   CSN: [id number] Arrival date & time: 12/31/17  1526     History   Chief Complaint Chief Complaint  Patient presents with  . Emesis  . Abdominal Pain  . Diarrhea    HPI Jacob Ho is a 42 y.o. male with history of asthma presents for evaluation of acute onset, progressively worsening nausea, vomiting, diarrhea since this morning.  Patient states that when he awoke at around 9 AM he felt somewhat ill.  He states that he was attempting to go to the store when he had his first episode of nonbloody nonbilious emesis.  He notes "countless" episodes since then.  He has also had multiple episodes of watery nonbloody diarrhea.  He developed suprapubic and right lower quadrant abdominal pain a few hours ago which worsens with vomiting.  Pain does not radiate.  He has tried Imodium, Pepto-Bismol, ginger ale, and saltine crackers without relief.  Denies urinary symptoms, fevers, chest pain, shortness of breath.  He does endorse chills.  Denies any suspicious food intake but did have some ice cream last night.  This is not unusual for him.  Of note, the patient was put on doxycycline 4 days ago for complaint of facial rash, which has significantly improved.   The history is provided by the patient.    Past Medical History:  Diagnosis Date  . Asthma     Patient Active Problem List   Diagnosis Date Noted  . Asthma attack 04/11/2014  . Asthma exacerbation 02/16/2014    History reviewed. No pertinent surgical history.      Home Medications    Prior to Admission medications   Medication Sig Start Date End Date Taking? Authorizing Provider  doxycycline (VIBRAMYCIN) 100 MG capsule Take 1 capsule (100 mg total) by mouth 2 (two) times daily. 12/27/17  Yes Wieters, Hallie C, PA-C  Cetirizine HCl 10 MG CAPS Take 1 capsule (10 mg total) by mouth 2 (two) times daily for 4 days. Patient not taking: Reported on 12/31/2017 12/27/17  12/31/17  Wieters, Hallie C, PA-C  mometasone-formoterol (DULERA) 200-5 MCG/ACT AERO Inhale 2 puffs into the lungs 2 (two) times daily. Patient not taking: Reported on 12/31/2017 07/09/17   Eyvonne MechanicHedges, Jeffrey, PA-C  ondansetron (ZOFRAN ODT) 4 MG disintegrating tablet Take 1 tablet (4 mg total) by mouth every 8 (eight) hours as needed for nausea or vomiting. 12/31/17   Jeanie SewerFawze, Lainey Nelson A, PA-C    Family History Family History  Problem Relation Age of Onset  . Diabetes Mother   . Asthma Mother   . Heart attack Father   . Asthma Sister     Social History Social History   Tobacco Use  . Smoking status: Former Smoker    Start date: 05/21/1991    Last attempt to quit: 09/09/2013    Years since quitting: 4.3  . Smokeless tobacco: Never Used  . Tobacco comment: smoking black and milds 1 a day  Substance Use Topics  . Alcohol use: No  . Drug use: Yes    Comment: marijuana occasionally     Allergies   Shellfish allergy and Iodine   Review of Systems Review of Systems  Constitutional: Positive for chills. Negative for fever.  Respiratory: Negative for shortness of breath.   Cardiovascular: Negative for chest pain.  Gastrointestinal: Positive for abdominal pain, diarrhea, nausea and vomiting.  Genitourinary: Negative for dysuria, frequency, hematuria and urgency.  All other systems reviewed and are negative.    Physical  Exam Updated Vital Signs BP (!) 160/89   Pulse (!) 51   Temp 98.1 F (36.7 C) (Oral)   Resp 11   Ht 5\' 9"  (1.753 m)   Wt 86.2 kg   SpO2 100%   BMI 28.06 kg/m   Physical Exam  Constitutional: He appears well-developed and well-nourished.  Appears quite uncomfortable, writhing around in bed  HENT:  Head: Normocephalic and atraumatic.  No noted facial swelling, tolerating secretions without difficulty.  Eyes: Conjunctivae are normal. Right eye exhibits no discharge. Left eye exhibits no discharge.  Neck: No JVD present. No tracheal deviation present.    Cardiovascular: Normal rate, regular rhythm and normal heart sounds.  Pulmonary/Chest: Effort normal and breath sounds normal.  Abdominal: Soft. Bowel sounds are normal. He exhibits no distension. There is tenderness in the right lower quadrant and suprapubic area. There is no rigidity, no rebound, no guarding, no CVA tenderness, no tenderness at McBurney's point and negative Murphy's sign.  Very mild suprapubic and right lower quadrant abdominal tenderness, actively vomiting in the ED.  Musculoskeletal: He exhibits no edema.  Neurological: He is alert.  Skin: Skin is warm and dry. No erythema.  Psychiatric: He has a normal mood and affect. His behavior is normal.  Nursing note and vitals reviewed.    ED Treatments / Results  Labs (all labs ordered are listed, but only abnormal results are displayed) Labs Reviewed  COMPREHENSIVE METABOLIC PANEL - Abnormal; Notable for the following components:      Result Value   Glucose, Bld 153 (*)    All other components within normal limits  CBC - Abnormal; Notable for the following components:   WBC 16.8 (*)    All other components within normal limits  URINALYSIS, ROUTINE W REFLEX MICROSCOPIC - Abnormal; Notable for the following components:   APPearance HAZY (*)    Glucose, UA 50 (*)    Ketones, ur 20 (*)    All other components within normal limits  GASTROINTESTINAL PANEL BY PCR, STOOL (REPLACES STOOL CULTURE)  LIPASE, BLOOD    EKG None  Radiology Ct Abdomen Pelvis Wo Contrast  Result Date: 12/31/2017 CLINICAL DATA:  Abdominal pain Nausea Vomiting Patient given oral contrast media(readicat barium) EXAM: CT ABDOMEN AND PELVIS WITHOUT CONTRAST TECHNIQUE: Multidetector CT imaging of the abdomen and pelvis was performed following the standard protocol without IV contrast. COMPARISON:  None. FINDINGS: Lower chest: No acute abnormality. Hepatobiliary: No focal liver abnormality is seen. No gallstones, gallbladder wall thickening, or biliary  dilatation. Pancreas: Unremarkable. No pancreatic ductal dilatation or surrounding inflammatory changes. Spleen: Normal in size without focal abnormality. Adrenals/Urinary Tract: Adrenal glands are unremarkable. Kidneys are normal, without renal calculi, focal lesion, or hydronephrosis. Bladder is unremarkable. Stomach/Bowel: Stomach is within normal limits. Appendix is retrocecal, appears normal. No evidence of bowel wall thickening, distention, or inflammatory changes. Vascular/Lymphatic: Minimal aortoiliac calcified plaque without aneurysm. No abdominal or pelvic adenopathy. Reproductive: Prostate is unremarkable. Other: No ascites.  No free air. Musculoskeletal: No acute or significant osseous findings. IMPRESSION: No acute findings. Electronically Signed   By: Corlis Leak  Hassell M.D.   On: 12/31/2017 19:05    Procedures Procedures (including critical care time)  Medications Ordered in ED Medications  ondansetron (ZOFRAN) injection 4 mg (4 mg Intravenous Given 12/31/17 1605)  sodium chloride 0.9 % bolus 1,000 mL ( Intravenous Stopped 12/31/17 1808)  dicyclomine (BENTYL) tablet 20 mg (20 mg Oral Given 12/31/17 1642)  morphine 4 MG/ML injection 4 mg (4 mg Intravenous Given 12/31/17 1643)  metoCLOPramide (REGLAN) injection 10 mg (10 mg Intravenous Given 12/31/17 1930)  diphenhydrAMINE (BENADRYL) injection 25 mg (25 mg Intravenous Given 12/31/17 1928)     Initial Impression / Assessment and Plan / ED Course  I have reviewed the triage vital signs and the nursing notes.  Pertinent labs & imaging results that were available during my care of the patient were reviewed by me and considered in my medical decision making (see chart for details).     Patient presents with acute onset of nausea, vomiting, diarrhea, and abdominal pain earlier today.  He is afebrile, hypertensive while in the ED with some improvement on reevaluation.  No peritoneal signs on examination of the abdomen.  Lab work reviewed by me shows  leukocytosis of 16.8, no metabolic derangements, LFTs, lipase, and creatinine within normal limits.  UA consistent with mild dehydration but otherwise not concerning for UTI or nephrolithiasis.  CT scan of the abdomen and pelvis shows no acute findings.  No evidence of obstruction, perforation, appendicitis, colitis, AAA, or other acute surgical abdominal pathology.  While in the ED, the patient was given IV fluids, Bentyl, morphine, Zofran initially but did have repeat emesis.  He was then given Reglan and Benadryl.  On reevaluation the patient is resting comfortably in no apparent distress.  Serial abdominal examinations remain benign.  He is tolerating p.o. fluids in the ED without difficulty.  Suspect viral gastroenteritis.  He is currently on a course of doxycycline for facial cellulitis versus allergic reaction which the patient's wife states has essentially resolved.  He is tolerating secretions without difficulty.  No evidence of angioedema.  No evidence of secondary skin infection.  I have a low suspicion of C. difficile given symptoms just began today.  Patient was unable to provide stool sample while in the ED.  Stable for discharge home with Zofran for nausea, instructions to advance diet slowly.  Recommend follow-up with PCP for reevaluation of his slightly elevated blood pressure and his symptoms.  Discussed strict ED return precautions.  Patient and patient's significant other verbalized understanding of and agreement with plan and patient is stable for discharge home at this time. Final Clinical Impressions(s) / ED Diagnoses   Final diagnoses:  Nausea vomiting and diarrhea  Abdominal pain, unspecified abdominal location    ED Discharge Orders         Ordered    ondansetron (ZOFRAN ODT) 4 MG disintegrating tablet  Every 8 hours PRN     12/31/17 2103           Jeanie Sewer, PA-C 01/01/18 0015    Lorre Nick, MD 01/01/18 2238

## 2017-12-31 NOTE — ED Notes (Signed)
Bed: WA23 Expected date:  Expected time:  Means of arrival:  Comments: 

## 2017-12-31 NOTE — ED Notes (Signed)
Pt given a gingerale

## 2017-12-31 NOTE — ED Triage Notes (Signed)
Patient BIB [facility] from home for n/v/d x7 hrs. C/o RLQ abd pain with rebound tenderness, 5/10. Pt has been taking doxycycline for facial swelling. Pt tried imodium, pepto, ginger ale and saltines with no relief. Pt ambulatory to stretcher.

## 2017-12-31 NOTE — Discharge Instructions (Addendum)
1. Medications: Take [phone] mg of Tylenol every 6 hours as needed for pain. Do not exceed 4000 mg of Tylenol daily.   Take Zofran as needed for nausea.  Wait around 20 minutes before eating or drinking after taking this medication. You may find it helpful to take yogurt or probiotics.  2. Treatment: rest, drink plenty of fluids, advance diet slowly.  Start with water and broth then advance to bland foods that will not upset your stomach such as crackers, mashed potatoes, and peanut butter. 3. Follow Up: Please followup with your primary doctor in 3 days for discussion of your diagnoses and further evaluation after today's visit; if you do not have a primary care doctor use the resource guide provided to find one; Please return to the ER for persistent vomiting, high fevers or worsening symptoms  If your blood pressure (BP) was elevated on multiple readings during this visit above 130 for the top number or above 80 for the bottom number, please have this repeated by your primary care provider within one month. You can also check your blood pressure when you are out at a pharmacy or grocery store. Many have machines that will check your blood pressure.  If your blood pressure remains elevated, please follow-up with your PCP.

## 2018-01-09 MED FILL — DULERA 200 MCG/5 MCG INH: 200-5 | 30 days supply | Qty: 13 | Fill #2

## 2018-08-31 ENCOUNTER — Encounter (HOSPITAL_COMMUNITY): Payer: Self-pay | Admitting: Emergency Medicine

## 2018-08-31 ENCOUNTER — Emergency Department (HOSPITAL_COMMUNITY): Payer: Self-pay

## 2018-08-31 ENCOUNTER — Other Ambulatory Visit: Payer: Self-pay

## 2018-08-31 ENCOUNTER — Emergency Department (HOSPITAL_COMMUNITY)
Admission: EM | Admit: 2018-08-31 | Discharge: 2018-08-31 | Disposition: A | Discharge status: Home / Self Care | Payer: Self-pay | Source: Home / Self Care | Attending: Emergency Medicine | Admitting: Emergency Medicine

## 2018-08-31 ENCOUNTER — Inpatient Hospital Stay (HOSPITAL_COMMUNITY)
Admission: EM | Admit: 2018-08-31 | Discharge: 2018-09-08 | DRG: 493 | Disposition: A | Discharge status: Home / Self Care | Payer: Self-pay | Attending: Orthopedic Surgery | Admitting: Orthopedic Surgery

## 2018-08-31 DIAGNOSIS — F17210 Nicotine dependence, cigarettes, uncomplicated: Secondary | ICD-10-CM

## 2018-08-31 DIAGNOSIS — E119 Type 2 diabetes mellitus without complications: Secondary | ICD-10-CM | POA: Insufficient documentation

## 2018-08-31 DIAGNOSIS — S8392XA Sprain of unspecified site of left knee, initial encounter: Secondary | ICD-10-CM

## 2018-08-31 DIAGNOSIS — Y939 Activity, unspecified: Secondary | ICD-10-CM | POA: Insufficient documentation

## 2018-08-31 DIAGNOSIS — Y929 Unspecified place or not applicable: Secondary | ICD-10-CM

## 2018-08-31 DIAGNOSIS — Z825 Family history of asthma and other chronic lower respiratory diseases: Secondary | ICD-10-CM

## 2018-08-31 DIAGNOSIS — I10 Essential (primary) hypertension: Secondary | ICD-10-CM | POA: Insufficient documentation

## 2018-08-31 DIAGNOSIS — Z91030 Bee allergy status: Secondary | ICD-10-CM

## 2018-08-31 DIAGNOSIS — X500XXA Overexertion from strenuous movement or load, initial encounter: Secondary | ICD-10-CM | POA: Insufficient documentation

## 2018-08-31 DIAGNOSIS — Z419 Encounter for procedure for purposes other than remedying health state, unspecified: Secondary | ICD-10-CM

## 2018-08-31 DIAGNOSIS — S82143A Displaced bicondylar fracture of unspecified tibia, initial encounter for closed fracture: Secondary | ICD-10-CM

## 2018-08-31 DIAGNOSIS — Z79899 Other long term (current) drug therapy: Secondary | ICD-10-CM

## 2018-08-31 DIAGNOSIS — Z8249 Family history of ischemic heart disease and other diseases of the circulatory system: Secondary | ICD-10-CM

## 2018-08-31 DIAGNOSIS — N179 Acute kidney failure, unspecified: Secondary | ICD-10-CM | POA: Diagnosis present

## 2018-08-31 DIAGNOSIS — W1789XA Other fall from one level to another, initial encounter: Secondary | ICD-10-CM | POA: Diagnosis present

## 2018-08-31 DIAGNOSIS — Z91013 Allergy to seafood: Secondary | ICD-10-CM

## 2018-08-31 DIAGNOSIS — Z833 Family history of diabetes mellitus: Secondary | ICD-10-CM

## 2018-08-31 DIAGNOSIS — J45909 Unspecified asthma, uncomplicated: Secondary | ICD-10-CM | POA: Insufficient documentation

## 2018-08-31 DIAGNOSIS — T148XXA Other injury of unspecified body region, initial encounter: Secondary | ICD-10-CM

## 2018-08-31 DIAGNOSIS — Y999 Unspecified external cause status: Secondary | ICD-10-CM | POA: Insufficient documentation

## 2018-08-31 DIAGNOSIS — M25461 Effusion, right knee: Secondary | ICD-10-CM | POA: Diagnosis present

## 2018-08-31 DIAGNOSIS — Z888 Allergy status to other drugs, medicaments and biological substances status: Secondary | ICD-10-CM

## 2018-08-31 DIAGNOSIS — S82141A Displaced bicondylar fracture of right tibia, initial encounter for closed fracture: Principal | ICD-10-CM | POA: Diagnosis present

## 2018-08-31 DIAGNOSIS — E559 Vitamin D deficiency, unspecified: Secondary | ICD-10-CM | POA: Diagnosis present

## 2018-08-31 DIAGNOSIS — D62 Acute posthemorrhagic anemia: Secondary | ICD-10-CM | POA: Diagnosis not present

## 2018-08-31 DIAGNOSIS — Y9339 Activity, other involving climbing, rappelling and jumping off: Secondary | ICD-10-CM

## 2018-08-31 DIAGNOSIS — D72829 Elevated white blood cell count, unspecified: Secondary | ICD-10-CM | POA: Diagnosis present

## 2018-08-31 HISTORY — DX: Vitamin D deficiency, unspecified: E55.9

## 2018-08-31 LAB — BASIC METABOLIC PANEL
Anion gap: 8 (ref 5–15)
BUN: 8 mg/dL (ref 6–20)
CO2: 23 mmol/L (ref 22–32)
Calcium: 8.3 mg/dL — ABNORMAL LOW (ref 8.9–10.3)
Chloride: 110 mmol/L (ref 98–111)
Creatinine, Ser: 1.4 mg/dL — ABNORMAL HIGH (ref 0.61–1.24)
GFR calc Af Amer: >60 mL/min (ref >60)
GFR calc non Af Amer: >60 mL/min (ref >60)
Glucose, Bld: 101 mg/dL — ABNORMAL HIGH (ref 70–99)
Potassium: 4.8 mmol/L (ref 3.5–5.1)
Sodium: 141 mmol/L (ref 135–145)

## 2018-08-31 LAB — CBC WITH DIFFERENTIAL/PLATELET
Abs Immature Granulocytes: 0.09 10*3/uL — ABNORMAL HIGH (ref 0.00–0.07)
Basophils Absolute: 0 10*3/uL (ref 0.0–0.1)
Basophils Relative: 0 %
Eosinophils Absolute: 0 10*3/uL (ref 0.0–0.5)
Eosinophils Relative: 0 %
HCT: 47.3 % (ref 39.0–52.0)
Hemoglobin: 15.3 g/dL (ref 13.0–17.0)
Immature Granulocytes: 1 %
Lymphocytes Relative: 10 %
Lymphs Abs: 1.7 10*3/uL (ref 0.7–4.0)
MCH: 28.7 pg (ref 26.0–34.0)
MCHC: 32.3 g/dL (ref 30.0–36.0)
MCV: 88.7 fL (ref 80.0–100.0)
Monocytes Absolute: 1.2 10*3/uL — ABNORMAL HIGH (ref 0.1–1.0)
Monocytes Relative: 7 %
Neutro Abs: 13.1 10*3/uL — ABNORMAL HIGH (ref 1.7–7.7)
Neutrophils Relative %: 82 %
Platelets: 241 10*3/uL (ref 150–400)
RBC: 5.33 MIL/uL (ref 4.22–5.81)
RDW: 14.3 % (ref 11.5–15.5)
WBC: 16 10*3/uL — ABNORMAL HIGH (ref 4.0–10.5)
nRBC: 0 % (ref 0.0–0.2)

## 2018-08-31 MED ORDER — ONDANSETRON HCL 4 MG/2ML IJ SOLN
4.0000 mg | Freq: Once | INTRAMUSCULAR | Status: AC
  Administered 2018-08-31: 21:00:00 4 mg via INTRAVENOUS
  Filled 2018-08-31: qty 2

## 2018-08-31 MED ORDER — ONDANSETRON HCL 4 MG/2ML IJ SOLN
4.0000 mg | Freq: Once | INTRAMUSCULAR | Status: AC
  Administered 2018-08-31: 18:00:00 4 mg via INTRAVENOUS
  Filled 2018-08-31: qty 2

## 2018-08-31 MED ORDER — GABAPENTIN 300 MG PO CAPS
300.0000 mg | ORAL_CAPSULE | Freq: Three times a day (TID) | ORAL | Status: DC
  Administered 2018-08-31: 300 mg via ORAL
  Filled 2018-08-31: qty 1

## 2018-08-31 MED ORDER — METHOCARBAMOL 500 MG PO TABS: 500.0000 mg | ORAL_TABLET | Freq: Four times a day (QID) | ORAL | Status: DC | PRN

## 2018-08-31 MED ORDER — FENTANYL CITRATE (PF) 100 MCG/2ML IJ SOLN
100.0000 ug | Freq: Once | INTRAMUSCULAR | Status: DC
  Filled 2018-08-31: qty 2

## 2018-08-31 MED ORDER — IBUPROFEN 800 MG PO TABS
800.0000 mg | ORAL_TABLET | Freq: Once | ORAL | Status: AC
  Administered 2018-08-31: 800 mg via ORAL

## 2018-08-31 MED ORDER — HYDROMORPHONE HCL 1 MG/ML IJ SOLN
1.0000 mg | Freq: Once | INTRAMUSCULAR | Status: AC
  Administered 2018-08-31: 21:00:00 1 mg via INTRAVENOUS
  Filled 2018-08-31: qty 1

## 2018-08-31 MED ORDER — HYDROCODONE-ACETAMINOPHEN 5-325 MG PO TABS: 1.0000 | ORAL_TABLET | ORAL | Status: DC | PRN

## 2018-08-31 MED ORDER — CEFAZOLIN SODIUM-DEXTROSE 2-4 GM/100ML-% IV SOLN
2.0000 g | INTRAVENOUS | Status: AC
  Administered 2018-09-01: 2 g via INTRAVENOUS
  Filled 2018-08-31: qty 100

## 2018-08-31 MED ORDER — ACETAMINOPHEN 325 MG PO TABS: 325.0000 mg | ORAL_TABLET | Freq: Four times a day (QID) | ORAL | Status: DC | PRN

## 2018-08-31 MED ORDER — METHOCARBAMOL 1000 MG/10ML IJ SOLN
500.0000 mg | Freq: Four times a day (QID) | INTRAVENOUS | Status: DC | PRN
  Filled 2018-08-31: qty 5

## 2018-08-31 MED ORDER — IBUPROFEN 800 MG PO TABS
800.0000 mg | ORAL_TABLET | Freq: Once | ORAL | Status: AC
  Administered 2018-08-31: 800 mg via ORAL
  Filled 2018-08-31: qty 1

## 2018-08-31 MED ORDER — ONDANSETRON HCL 4 MG/2ML IJ SOLN
4.0000 mg | Freq: Once | INTRAMUSCULAR | Status: AC
  Administered 2018-08-31: 20:00:00 4 mg via INTRAVENOUS

## 2018-08-31 MED ORDER — TRAMADOL HCL 50 MG PO TABS
50.0000 mg | ORAL_TABLET | Freq: Four times a day (QID) | ORAL | Status: DC
  Administered 2018-08-31: 50 mg via ORAL
  Filled 2018-08-31: qty 1

## 2018-08-31 MED ORDER — CHLORHEXIDINE GLUCONATE 4 % EX LIQD
60.0000 mL | Freq: Once | CUTANEOUS | Status: AC
  Administered 2018-09-01: 4 via TOPICAL

## 2018-08-31 MED ORDER — SODIUM CHLORIDE 0.9 % IV BOLUS: 500.0000 mL | Freq: Once | INTRAVENOUS | Status: DC

## 2018-08-31 MED ORDER — LACTATED RINGERS IV SOLN
INTRAVENOUS | Status: DC
  Administered 2018-08-31: via INTRAVENOUS

## 2018-08-31 MED ORDER — MORPHINE SULFATE (PF) 2 MG/ML IV SOLN
0.5000 mg | INTRAVENOUS | Status: DC | PRN
  Administered 2018-08-31 – 2018-09-01 (×3): 1 mg via INTRAVENOUS
  Filled 2018-08-31 (×3): qty 1

## 2018-08-31 MED ORDER — SODIUM CHLORIDE 0.9 % IV BOLUS
500.0000 mL | Freq: Once | INTRAVENOUS | Status: AC
  Administered 2018-08-31: 20:00:00 500 mL via INTRAVENOUS

## 2018-08-31 NOTE — ED Provider Notes (Addendum)
[name] EMERGENCY DEPARTMENT Provider Note   CSN: [id number] Arrival date & time: 08/31/18  1723    History   Chief Complaint Chief Complaint  Patient presents with  . Knee Pain    HPI Ruffus Hopp is a 43 y.o. male.     The history is provided by the patient.  Knee Pain  Location:  Knee Injury: yes   Mechanism of injury comment:  Twisted while running from the police Knee location:  R knee Pain details:    Quality:  Aching   Radiates to:  Does not radiate   Severity:  Moderate   Onset quality:  Sudden   Timing:  Constant   Progression:  Unchanged Chronicity:  New Dislocation: no   Prior injury to area:  No Relieved by: narcotics by EMS. Worsened by:  Bearing weight Ineffective treatments:  None tried Associated symptoms: swelling   Associated symptoms: no back pain, no decreased ROM, no fatigue, no fever, no itching, no muscle weakness, no neck pain, no numbness, no stiffness and no tingling     Past Medical History:  Diagnosis Date  . Asthma   . Bipolar 1 disorder (HCC)   . Depression   . Diabetes mellitus without complication (HCC)   . Heart murmur   . Hypertension     Patient Active Problem List   Diagnosis Date Noted  . Depression 09/17/2017    Past Surgical History:  Procedure Laterality Date  . KNEE SURGERY          Home Medications    Prior to Admission medications   Medication Sig Start Date End Date Taking? Authorizing Provider  BusPIRone HCl (BUSPAR PO) Take 1 tablet by mouth daily.     [provider]  cyclobenzaprine (FLEXERIL) 10 MG tablet Take 1 tablet (10 mg total) by mouth 2 (two) times daily as needed for muscle spasms. 12/05/17   Janne Napoleon, NP  HydrOXYzine Pamoate (VISTARIL PO) Take 1 capsule by mouth daily.     [provider]  LamoTRIgine (LAMICTAL PO) Take 1 tablet by mouth daily.     [provider]  naproxen (NAPROSYN) 500 MG tablet Take 1 tablet (500 mg total) by  mouth 2 (two) times daily. 12/05/17   Janne Napoleon, NP    Family History Family History  Problem Relation Age of Onset  . Diabetes Mother   . Hypertension Mother     Social History Social History   Tobacco Use  . Smoking status: Current Every Day Smoker    Packs/day: 0.15    Types: Cigarettes  . Smokeless tobacco: Never Used  Substance Use Topics  . Alcohol use: Not Currently  . Drug use: Not Currently     Allergies   Bee venom   Review of Systems Review of Systems  Constitutional: Negative for fatigue and fever.  Musculoskeletal: Positive for arthralgias and gait problem. Negative for back pain, joint swelling, myalgias, neck pain, neck stiffness and stiffness.  Skin: Negative for color change, itching, pallor, rash and wound.  Neurological: Negative for weakness and numbness.     Physical Exam Updated Vital Signs  ED Triage Vitals [08/31/18 1733]  Enc Vitals Group     BP 110/73     Pulse Rate 93     Resp 18     Temp 98.4 F (36.9 C)     Temp Source Oral     SpO2 97 %     Weight  Height      Head Circumference      Peak Flow      Pain Score      Pain Loc      Pain Edu?      Excl. in GC?     Physical Exam Cardiovascular:     Pulses: Normal pulses.  Musculoskeletal:        General: Swelling and tenderness present. No deformity.     Right lower leg: No edema.     Left lower leg: No edema.     Comments: Patient tender around the right knee, there is no obvious laxity, no major swelling but there is some trace swelling around the knee.  No obvious dislocation of the right lower extremity, patella in place, patient has fairly good range of motion of the right lower extremity but difficult to assess due to pain  Skin:    General: Skin is warm.     Capillary Refill: Capillary refill takes less than 2 seconds.  Neurological:     General: No focal deficit present.     Sensory: No sensory deficit.     Motor: No weakness.      ED Treatments /  Results  Labs (all labs ordered are listed, but only abnormal results are displayed) Labs Reviewed - No data to display  EKG None  Radiology No results found.  Procedures Procedures (including critical care time)  Medications Ordered in ED Medications  sodium chloride 0.9 % bolus 500 mL (has no administration in time range)  ibuprofen (ADVIL,MOTRIN) tablet 800 mg (800 mg Oral Given 08/31/18 1737)  ondansetron (ZOFRAN) injection 4 mg (4 mg Intravenous Given 08/31/18 1827)     Initial Impression / Assessment and Plan / ED Course  I have reviewed the triage vital signs and the nursing notes.  Pertinent labs & imaging results that were available during my care of the patient were reviewed by me and considered in my medical decision making (see chart for details).     Asmar R Risinger is a 43 year old male with no significant medical history who presents to the ED with right knee pain.  Patient with normal vitals.  No fever.  Patient injured right knee while running over the past.  He states that he was running and he twisted it while trying to turn.  No history of injury in this area.  Neurovascularly intact.  Has some mild swelling.  No obvious joint laxity.  No concern for dislocation.  Likely has knee sprain. Given zofran and fluids for nausea he developed after he was given fentanyl by EMS.  Patient signed out to Sharen Hecklaudia Gibbons PA with patient awaiting xrays. Patient likely with knee sprain. Low concern for quad tendon rupture/dislocation. Good pulses/soft compartments. Likely will knee crutches and brace and outpatient follow up for PT/MRI.   This chart was dictated using voice recognition software.  Despite best efforts to proofread,  errors can occur which can change the documentation meaning.    Final Clinical Impressions(s) / ED Diagnoses   Final diagnoses:  None    ED Discharge Orders    None       Virgina NorfolkCuratolo, Gaines Cartmell, DO 08/31/18 1838    Virgina Norfolkuratolo, Ifeanyi Mickelson, DO  10/28/18 1324

## 2018-08-31 NOTE — ED Notes (Signed)
ED TO INPATIENT HANDOFF REPORT  ED Nurse Name and Phone #: Jonnie Kubly [phone]  S Name/Age/Gender Jacob Ho 43 y.o. male Room/Bed: 007C/007C  Code Status   Code Status: Prior  Home/SNF/Other Jail Patient oriented to: self, place, time and situation Is this baseline? Yes   Triage Complete: Triage complete  Chief Complaint knee pain swelling  Triage Note Pt in via [facility], per report pt was running and twisted knee, pt has officer at bedside and is in bil hand cuffs upon arrival, pt has R knee swelling, pt rcvd 100 mcg Fentanyl pta, DP present, #20 R AC, A&O x4   Allergies Allergies  Allergen Reactions  . Shellfish Allergy Anaphylaxis  . Iodine     Level of Care/Admitting Diagnosis ED Disposition    ED Disposition Condition Comment   Admit  [name] [[id number]]  Level of Care: Med-Surg [16]  Diagnosis: Tibial plateau fracture [[id number]][[id number]]  Admitting Physician: Cammy CopaEAN, GREGORY SCOTT [[id number]]  Attending Physician: Cammy CopaEAN, GREGORY SCOTT [[id number]]  Estimated length of stay: past midnight tomorrow  Certification:: I certify this patient will need inpatient services for at least 2 midnights  Bed request comments: 6n  PT Class (Do Not Modify): Inpatient [101]  PT Acc Code (Do Not Modify): Private [1]       B Medical/Surgery History Past Medical History:  Diagnosis Date  . Asthma    History reviewed. No pertinent surgical history.   A IV Location/Drains/Wounds Patient Lines/Drains/Airways Status   Active Line/Drains/Airways    Name:   Placement date:   Placement time:   Site:   Days:   Peripheral IV 08/31/18 Right Antecubital   08/31/18    1826    Antecubital   less than 1   Peripheral IV 08/31/18 Right Antecubital   08/31/18    1929    Antecubital   less than 1          Intake/Output Last 24 hours  Intake/Output Summary (Last 24 hours) at 08/31/2018 2213 Last data filed at 08/31/2018 1930 Gross per 24 hour  Intake 500 ml  Output -   Net 500 ml    Labs/Imaging Results for orders placed or performed during the hospital encounter of 08/31/18 (from the past 48 hour(s))  CBC with Differential     Status: Abnormal   Collection Time: 08/31/18  9:15 PM  Result Value Ref Range   WBC 16.0 (H) 4.0 - 10.5 K/uL   RBC 5.33 4.22 - 5.81 MIL/uL   Hemoglobin 15.3 13.0 - 17.0 g/dL   HCT 11.947.3 14.739.0 - 82.952.0 %   MCV 88.7 80.0 - 100.0 fL   MCH 28.7 26.0 - 34.0 pg   MCHC 32.3 30.0 - 36.0 g/dL   RDW 56.214.3 13.011.5 - 86.515.5 %   Platelets 241 150 - 400 K/uL   nRBC 0.0 0.0 - 0.2 %   Neutrophils Relative % 82 %   Neutro Abs 13.1 (H) 1.7 - 7.7 K/uL   Lymphocytes Relative 10 %   Lymphs Abs 1.7 0.7 - 4.0 K/uL   Monocytes Relative 7 %   Monocytes Absolute 1.2 (H) 0.1 - 1.0 K/uL   Eosinophils Relative 0 %   Eosinophils Absolute 0.0 0.0 - 0.5 K/uL   Basophils Relative 0 %   Basophils Absolute 0.0 0.0 - 0.1 K/uL   Immature Granulocytes 1 %   Abs Immature Granulocytes 0.09 (H) 0.00 - 0.07 K/uL    Comment: Performed at [facility], [address] [place], [place] [address]  Basic metabolic panel     Status: Abnormal   Collection Time: 08/31/18  9:15 PM  Result Value Ref Range   Sodium 141 135 - 145 mmol/L   Potassium 4.8 3.5 - 5.1 mmol/L   Chloride 110 98 - 111 mmol/L   CO2 23 22 - 32 mmol/L   Glucose, Bld 101 (H) 70 - 99 mg/dL   BUN 8 6 - 20 mg/dL   Creatinine, Ser 9.32 (H) 0.61 - 1.24 mg/dL   Calcium 8.3 (L) 8.9 - 10.3 mg/dL   GFR calc non Af Amer >60 >60 mL/min   GFR calc Af Amer >60 >60 mL/min   Anion gap 8 5 - 15    Comment: Performed at [facility], [address] [place], [place] [address]   Dg Tibia/fibula Right  Result Date: 08/31/2018 CLINICAL DATA:  Knee pain after jumping injury EXAM: RIGHT TIBIA AND FIBULA - 2 VIEW COMPARISON:  None. FINDINGS: There is a comminuted fracture of the proximal right tibia, extending from the medial surface to the lateral tibial plateau. There is dorsal and inferior displacement of  fracture fragments. IMPRESSION: Comminuted, intra-articular fracture of the proximal right tibia. CT may be helpful for further characterization fracture pattern. Electronically Signed   By: Deatra Robinson M.D.   On: 08/31/2018 19:43   Dg Knee Complete 4 Views Right  Result Date: 08/31/2018 CLINICAL DATA:  43 year old male with a jumping injury EXAM: RIGHT KNEE - COMPLETE 4+ VIEW COMPARISON:  None. FINDINGS: Comminuted fracture of the right tibial plateau is multiple fracture fragments. Joint effusion. IMPRESSION: Acute comminuted fracture of the tibial plateau with joint effusion Electronically Signed   By: Gilmer Mor D.O.   On: 08/31/2018 19:42    Pending Labs Unresulted Labs (From admission, onward)    Start     Ordered   Signed and Held  HIV antibody (Routine Testing)  Once,   R     Signed and Held          Vitals/Pain Today's Vitals   08/31/18 1923 08/31/18 1924 08/31/18 1929  BP: 110/73    Pulse: 93    Resp: 18    Temp: 98.4 F (36.9 C)    TempSrc: Oral    SpO2: 97%    PainSc:  6  6     Isolation Precautions No active isolations  Medications Medications  ibuprofen (ADVIL,MOTRIN) tablet 800 mg (800 mg Oral Given 08/31/18 1926)  ondansetron (ZOFRAN) injection 4 mg (4 mg Intravenous Given 08/31/18 1930)  sodium chloride 0.9 % bolus 500 mL (0 mLs Intravenous Stopped 08/31/18 1930)  ondansetron (ZOFRAN) injection 4 mg (4 mg Intravenous Given 08/31/18 2113)  HYDROmorphone (DILAUDID) injection 1 mg (1 mg Intravenous Given 08/31/18 2114)    Mobility walks Low fall risk   Focused Assessments musculoskeletal   R Recommendations: See Admitting Provider Note  Report given to:   Additional Notes:  Pt in custody of sheriffs. Ortho paged to apply hard splint to leg for immobilization until surgery tomorrow.

## 2018-08-31 NOTE — ED Provider Notes (Signed)
1950: Patient handed off to me by previous ED MD Curatolo at shift change pending x-rays to evaluate for right knee injury.  Of note, this patient was erroneously registered and his chart is now being merged.  There is a lapse of previous ED MD note being updated to his chart.  Briefly, patient was running and had lock and twist type injury to the right knee.  He has right knee swelling.  He received 100 mcg fentanyl by EMS which caused transient nausea and emesis.  He is receiving Zofran and IV fluids.   2025: Pt evaluated. Asleep. Discussed x-ray finding and consult to ortho to determine management. He denies distal paresthesias or loss of sensation.  Physical Exam  BP 110/73   Pulse 93   Temp 98.4 F (36.9 C) (Oral)   Resp 18   SpO2 97%   Physical Exam Constitutional:      Appearance: He is well-developed. He is not toxic-appearing.  HENT:     Head: Normocephalic.     Right Ear: External ear normal.     Left Ear: External ear normal.     Nose: Nose normal.  Eyes:     Conjunctiva/sclera: Conjunctivae normal.  Neck:     Musculoskeletal: Full passive range of motion without pain.  Cardiovascular:     Rate and Rhythm: Normal rate.     Comments: 1+ DP pulse to right LE  Pulmonary:     Effort: Pulmonary effort is normal. No tachypnea or respiratory distress.  Musculoskeletal: Normal range of motion.        General: Tenderness present.     Comments: Right leg held is slight external rotation.  Diffuse anterior/inferior knee tenderness with mild edema. No skin break. No distal tib/fib tenderness. No tenderness to bony prominences of right ankle, foot, toes.  Full passive ROM of right ankle and toes.   Skin:    General: Skin is warm and dry.     Capillary Refill: Capillary refill takes less than 2 seconds.  Neurological:     Mental Status: He is alert and oriented to person, place, and time.     Comments: Sensation to light touch intact in RLE   Psychiatric:        Behavior: Behavior  normal.        Thought Content: Thought content normal.     ED Course/Procedures   Clinical Course as of Aug 30 2144  Mon Aug 31, 2018  1949 IMPRESSION: Comminuted, intra-articular fracture of the proximal right tibia. CT may be helpful for further characterization fracture pattern.  DG Tibia/Fibula Right [CG]  1949 FINDINGS: Comminuted fracture of the right tibial plateau is multiple fracture fragments. Joint effusion.  DG Knee Complete 4 Views Right [CG]  2130 Dr August Saucer called he will admit pt and OR tomorrow, to cancel CT. Updated patient and sheriff    [CG]    Clinical Course User Index [CG] Jerrell Mylar    Procedures  MDM   2028: Ortho consult pending  2145: X-ray as above. Dr August Saucer at bedside. He will admit patient and plan for OR tomorrow. CT cancelled per his orders. Sheriff and pt updated.      Liberty Handy, PA-C 08/31/18 2146    Virgina Norfolk, DO 09/08/18 1216

## 2018-08-31 NOTE — ED Triage Notes (Signed)
Pt in via [facility], per report pt was running and twisted knee, pt has officer at bedside and is in bil hand cuffs upon arrival, pt has R knee swelling, pt rcvd 100 mcg Fentanyl pta, DP present, #20 R AC, A&O x4 

## 2018-08-31 NOTE — ED Notes (Signed)
Gave pt a wet rag to keep cool

## 2018-08-31 NOTE — Progress Notes (Signed)
Orthopedic Tech Progress Note Patient Details:  Jacob Ho [birth year] [id number]  Ortho Devices Type of Ortho Device: Long leg splint Ortho Device/Splint Location: rle splint applied as instructed to by dr. Gaylord Shih Device/Splint Interventions: Ordered, Application, Adjustment   Post Interventions Patient Tolerated: Well Instructions Provided: Care of device, Adjustment of device   Trinna Post 08/31/2018, 11:26 PM

## 2018-08-31 NOTE — ED Notes (Signed)
Pt. Was moved from a chair to emergency stretcher. Pants were removed prior to ortho tech and splint being applied.

## 2018-08-31 NOTE — ED Notes (Signed)
Zackery Barefoot (Mom) : [phone]

## 2018-08-31 NOTE — ED Notes (Signed)
Ortho MD at bedside.

## 2018-08-31 NOTE — H&P (Signed)
Jacob Ho is an 43 y.o. male.   Chief Complaint: Right knee pain HPI: Jacob Ho is a 43 year old patient who was running [place] his property today.  He ran and was hopping and he landed on his right knee.  Collapsed and try to get up and walk and run but he could not do anymore of that because of his knee pain.  He is escorted here tonight with a Emergency planning/management officer.  He denies any other orthopedic complaints.  Past Medical History:  Diagnosis Date  . Asthma     History reviewed. No pertinent surgical history.  Family History  Problem Relation Age of Onset  . Diabetes Mother   . Asthma Mother   . Heart attack Father   . Asthma Sister    Social History:  reports that he quit smoking about 4 years ago. He started smoking about 27 years ago. He has never used smokeless tobacco. He reports current drug use. He reports that he does not drink alcohol.  Allergies:  Allergies  Allergen Reactions  . Shellfish Allergy Anaphylaxis  . Iodine     (Not in a hospital admission)   Results for orders placed or performed during the hospital encounter of 08/31/18 (from the past 48 hour(s))  CBC with Differential     Status: Abnormal   Collection Time: 08/31/18  9:15 PM  Result Value Ref Range   WBC 16.0 (H) 4.0 - 10.5 K/uL   RBC 5.33 4.22 - 5.81 MIL/uL   Hemoglobin 15.3 13.0 - 17.0 g/dL   HCT 44.0 34.7 - 42.5 %   MCV 88.7 80.0 - 100.0 fL   MCH 28.7 26.0 - 34.0 pg   MCHC 32.3 30.0 - 36.0 g/dL   RDW 95.6 38.7 - 56.4 %   Platelets 241 150 - 400 K/uL   nRBC 0.0 0.0 - 0.2 %   Neutrophils Relative % 82 %   Neutro Abs 13.1 (H) 1.7 - 7.7 K/uL   Lymphocytes Relative 10 %   Lymphs Abs 1.7 0.7 - 4.0 K/uL   Monocytes Relative 7 %   Monocytes Absolute 1.2 (H) 0.1 - 1.0 K/uL   Eosinophils Relative 0 %   Eosinophils Absolute 0.0 0.0 - 0.5 K/uL   Basophils Relative 0 %   Basophils Absolute 0.0 0.0 - 0.1 K/uL   Immature Granulocytes 1 %   Abs Immature Granulocytes 0.09 (H) 0.00 - 0.07 K/uL   Comment: Performed at [facility], [address], [place], [place] [address]   Dg Tibia/fibula Right  Result Date: 08/31/2018 CLINICAL DATA:  Knee pain after jumping injury EXAM: RIGHT TIBIA AND FIBULA - 2 VIEW COMPARISON:  None. FINDINGS: There is a comminuted fracture of the proximal right tibia, extending from the medial surface to the lateral tibial plateau. There is dorsal and inferior displacement of fracture fragments. IMPRESSION: Comminuted, intra-articular fracture of the proximal right tibia. CT may be helpful for further characterization fracture pattern. Electronically Signed   By: Deatra Robinson M.D.   On: 08/31/2018 19:43   Dg Knee Complete 4 Views Right  Result Date: 08/31/2018 CLINICAL DATA:  43 year old male with a jumping injury EXAM: RIGHT KNEE - COMPLETE 4+ VIEW COMPARISON:  None. FINDINGS: Comminuted fracture of the right tibial plateau is multiple fracture fragments. Joint effusion. IMPRESSION: Acute comminuted fracture of the tibial plateau with joint effusion Electronically Signed   By: Gilmer Mor D.O.   On: 08/31/2018 19:42    Review of Systems  Musculoskeletal: Positive for joint  pain.  All other systems reviewed and are negative.   Blood pressure 110/73, pulse 93, temperature 98.4 F (36.9 C), temperature source Oral, resp. rate 18, SpO2 97 %. Physical Exam  Constitutional: He appears well-developed.  HENT:  Head: Normocephalic.  Eyes: Pupils are equal, round, and reactive to light.  Neck: Normal range of motion.  Cardiovascular: Normal rate.  Respiratory: Effort normal.  Neurological: He is alert.  Skin: Skin is warm.  Psychiatric: He has a normal mood and affect.  Examination of the right lower extremity demonstrates intact ankle dorsiflexion plantarflexion intact toe dorsiflexion plantarflexion.  Not much in the way of pain with passive motion of the toes.  There is some swelling around the knee but the compartments are soft.  Effusion is present  in the right knee.  Patient holds the knee in a flexed position.  He is comfortable currently.  Denies any other orthopedic complaints.  He is handcuffed to the bed with his right arm.  Assessment/Plan Impression is complex proximal tibia fracture.  I discussed this with Dr. Carola FrostHandy who will take over care tomorrow.  Plan tonight is for splinting of that right leg.  External fixator tomorrow.  CT scan after that.  No evidence of fracture blisters currently but he does have a small abrasion over the patella which is partial skin thickness.Burnard Bunting.  G Scott Dean, MD 08/31/2018, 9:44 PM

## 2018-08-31 NOTE — ED Notes (Signed)
[place], MD at bedside, pt vomiting and diaphoretic, pt denies ingestion of drug and alcohol use today, vitals WNL BP elevated 154/97

## 2018-08-31 NOTE — ED Triage Notes (Signed)
Pt in via [facility], per report pt was running and twisted knee, pt has officer at bedside and is in bil hand cuffs upon arrival, pt has R knee swelling, pt rcvd 100 mcg Fentanyl pta, DP present, #20 R AC, A&O x4

## 2018-08-31 NOTE — ED Notes (Signed)
Ortho at bedside.

## 2018-08-31 NOTE — ED Notes (Signed)
[place], MD at bedside, pt vomiting and diaphoretic, pt denies ingestion of drug and alcohol use today, vitals WNL BP elevated 154/97   Toniann Fail, RN from previous shift

## 2018-09-01 ENCOUNTER — Encounter (HOSPITAL_COMMUNITY)
Admission: EM | Disposition: A | Discharge status: Home / Self Care | Payer: Self-pay | Source: Home / Self Care | Attending: Orthopedic Surgery

## 2018-09-01 ENCOUNTER — Inpatient Hospital Stay (HOSPITAL_COMMUNITY): Payer: Self-pay | Admitting: Certified Registered"

## 2018-09-01 ENCOUNTER — Inpatient Hospital Stay (HOSPITAL_COMMUNITY): Payer: Self-pay

## 2018-09-01 ENCOUNTER — Encounter (HOSPITAL_COMMUNITY): Payer: Self-pay | Admitting: Anesthesiology

## 2018-09-01 HISTORY — PX: FINE NEEDLE ASPIRATION: SHX6590

## 2018-09-01 HISTORY — PX: EXTERNAL FIXATION LEG: SHX1549

## 2018-09-01 LAB — COMPREHENSIVE METABOLIC PANEL
ALT: 19 U/L (ref 0–44)
AST: 23 U/L (ref 15–41)
Albumin: 3.1 g/dL — ABNORMAL LOW (ref 3.5–5.0)
Alkaline Phosphatase: 54 U/L (ref 38–126)
Anion gap: 8 (ref 5–15)
BUN: 7 mg/dL (ref 6–20)
CO2: 27 mmol/L (ref 22–32)
Calcium: 8.8 mg/dL — ABNORMAL LOW (ref 8.9–10.3)
Chloride: 106 mmol/L (ref 98–111)
Creatinine, Ser: 1.44 mg/dL — ABNORMAL HIGH (ref 0.61–1.24)
GFR calc Af Amer: >60 mL/min (ref >60)
GFR calc non Af Amer: 59 mL/min — ABNORMAL LOW (ref >60)
Glucose, Bld: 102 mg/dL — ABNORMAL HIGH (ref 70–99)
Potassium: 4.2 mmol/L (ref 3.5–5.1)
Sodium: 141 mmol/L (ref 135–145)
Total Bilirubin: 1.3 mg/dL — ABNORMAL HIGH (ref 0.3–1.2)
Total Protein: 5.5 g/dL — ABNORMAL LOW (ref 6.5–8.1)

## 2018-09-01 LAB — SURGICAL PCR SCREEN
MRSA, PCR: NEGATIVE
Staphylococcus aureus: NEGATIVE

## 2018-09-01 LAB — HIV ANTIBODY (ROUTINE TESTING W REFLEX): HIV Screen 4th Generation wRfx: NONREACTIVE

## 2018-09-01 LAB — CBC
HCT: 45.1 % (ref 39.0–52.0)
Hemoglobin: 14.8 g/dL (ref 13.0–17.0)
MCH: 28.8 pg (ref 26.0–34.0)
MCHC: 32.8 g/dL (ref 30.0–36.0)
MCV: 87.7 fL (ref 80.0–100.0)
Platelets: 207 10*3/uL (ref 150–400)
RBC: 5.14 MIL/uL (ref 4.22–5.81)
RDW: 14.3 % (ref 11.5–15.5)
WBC: 12.7 10*3/uL — ABNORMAL HIGH (ref 4.0–10.5)
nRBC: 0 % (ref 0.0–0.2)

## 2018-09-01 LAB — RAPID URINE DRUG SCREEN, HOSP PERFORMED
Amphetamines: NOT DETECTED
Barbiturates: NOT DETECTED
Benzodiazepines: POSITIVE — AB
Cocaine: NOT DETECTED
Opiates: POSITIVE — AB
Tetrahydrocannabinol: POSITIVE — AB

## 2018-09-01 LAB — LACTIC ACID, PLASMA: Lactic Acid, Venous: 1.2 mmol/L (ref 0.5–1.9)

## 2018-09-01 SURGERY — EXTERNAL FIXATION, LOWER EXTREMITY
Anesthesia: General | Site: Leg Lower | Laterality: Right

## 2018-09-01 MED ORDER — CEFAZOLIN SODIUM-DEXTROSE 1-4 GM/50ML-% IV SOLN
1.0000 g | Freq: Four times a day (QID) | INTRAVENOUS | Status: AC
  Administered 2018-09-01 – 2018-09-02 (×2): 1 g via INTRAVENOUS
  Filled 2018-09-01 (×5): qty 50

## 2018-09-01 MED ORDER — POLYETHYLENE GLYCOL 3350 17 G PO PACK
17.0000 g | PACK | Freq: Every day | ORAL | Status: DC
  Administered 2018-09-02 – 2018-09-08 (×5): 17 g via ORAL
  Filled 2018-09-01 (×6): qty 1

## 2018-09-01 MED ORDER — METHOCARBAMOL 750 MG PO TABS
750.0000 mg | ORAL_TABLET | Freq: Three times a day (TID) | ORAL | Status: DC
  Administered 2018-09-01 – 2018-09-08 (×17): 750 mg via ORAL
  Filled 2018-09-01 (×18): qty 1

## 2018-09-01 MED ORDER — METOCLOPRAMIDE HCL 5 MG PO TABS
5.0000 mg | ORAL_TABLET | Freq: Three times a day (TID) | ORAL | Status: DC | PRN
  Administered 2018-09-06: 10 mg via ORAL
  Filled 2018-09-01: qty 2

## 2018-09-01 MED ORDER — MIDAZOLAM HCL 5 MG/5ML IJ SOLN
INTRAMUSCULAR | Status: DC | PRN
  Administered 2018-09-01: 2 mg via INTRAVENOUS

## 2018-09-01 MED ORDER — VITAMIN C 500 MG PO TABS
1000.0000 mg | ORAL_TABLET | Freq: Every day | ORAL | Status: DC
  Administered 2018-09-02 – 2018-09-08 (×6): 1000 mg via ORAL
  Filled 2018-09-01 (×6): qty 2

## 2018-09-01 MED ORDER — ALBUTEROL SULFATE (2.5 MG/3ML) 0.083% IN NEBU
2.5000 mg | INHALATION_SOLUTION | Freq: Once | RESPIRATORY_TRACT | Status: AC
  Administered 2018-09-01: 2.5 mg via RESPIRATORY_TRACT
  Filled 2018-09-01: qty 3

## 2018-09-01 MED ORDER — DEXMEDETOMIDINE HCL 200 MCG/2ML IV SOLN
INTRAVENOUS | Status: DC | PRN
  Administered 2018-09-01: 40 ug via INTRAVENOUS

## 2018-09-01 MED ORDER — LACTATED RINGERS IV SOLN
INTRAVENOUS | Status: DC
  Administered 2018-09-01 (×2): via INTRAVENOUS

## 2018-09-01 MED ORDER — DEXAMETHASONE SODIUM PHOSPHATE 10 MG/ML IJ SOLN
INTRAMUSCULAR | Status: AC
  Filled 2018-09-01: qty 1

## 2018-09-01 MED ORDER — ENOXAPARIN SODIUM 40 MG/0.4ML ~~LOC~~ SOLN
40.0000 mg | SUBCUTANEOUS | Status: DC
  Administered 2018-09-02 – 2018-09-08 (×6): 40 mg via SUBCUTANEOUS
  Filled 2018-09-01 (×6): qty 0.4

## 2018-09-01 MED ORDER — GLYCOPYRROLATE 0.2 MG/ML IJ SOLN
INTRAMUSCULAR | Status: DC | PRN
  Administered 2018-09-01: 0.2 mg via INTRAVENOUS

## 2018-09-01 MED ORDER — SUGAMMADEX SODIUM 200 MG/2ML IV SOLN
INTRAVENOUS | Status: DC | PRN
  Administered 2018-09-01: 200 mg via INTRAVENOUS

## 2018-09-01 MED ORDER — KETOROLAC TROMETHAMINE 30 MG/ML IJ SOLN
INTRAMUSCULAR | Status: AC
  Administered 2018-09-01: 30 mg via INTRAVENOUS
  Filled 2018-09-01: qty 1

## 2018-09-01 MED ORDER — MIDAZOLAM HCL 2 MG/2ML IJ SOLN
INTRAMUSCULAR | Status: AC
  Filled 2018-09-01: qty 2

## 2018-09-01 MED ORDER — HYDROMORPHONE HCL 1 MG/ML IJ SOLN
0.2500 mg | INTRAMUSCULAR | Status: DC | PRN
  Administered 2018-09-01 (×2): 0.5 mg via INTRAVENOUS

## 2018-09-01 MED ORDER — ACETAMINOPHEN 325 MG PO TABS
325.0000 mg | ORAL_TABLET | Freq: Four times a day (QID) | ORAL | Status: DC | PRN
  Administered 2018-09-01: 325 mg via ORAL
  Administered 2018-09-03 (×2): 650 mg via ORAL
  Filled 2018-09-01 (×2): qty 2

## 2018-09-01 MED ORDER — METOCLOPRAMIDE HCL 5 MG/ML IJ SOLN: 5.0000 mg | Freq: Three times a day (TID) | INTRAMUSCULAR | Status: DC | PRN

## 2018-09-01 MED ORDER — KETOROLAC TROMETHAMINE 30 MG/ML IJ SOLN
30.0000 mg | Freq: Once | INTRAMUSCULAR | Status: AC | PRN
  Administered 2018-09-01: 16:00:00 30 mg via INTRAVENOUS

## 2018-09-01 MED ORDER — OXYCODONE HCL 5 MG PO TABS
5.0000 mg | ORAL_TABLET | ORAL | Status: DC | PRN
  Administered 2018-09-02: 10 mg via ORAL

## 2018-09-01 MED ORDER — ONDANSETRON HCL 4 MG/2ML IJ SOLN
INTRAMUSCULAR | Status: AC
  Filled 2018-09-01: qty 2

## 2018-09-01 MED ORDER — ROCURONIUM BROMIDE 50 MG/5ML IV SOSY
PREFILLED_SYRINGE | INTRAVENOUS | Status: DC | PRN
  Administered 2018-09-01: 30 mg via INTRAVENOUS
  Administered 2018-09-01: 20 mg via INTRAVENOUS

## 2018-09-01 MED ORDER — 0.9 % SODIUM CHLORIDE (POUR BTL) OPTIME
TOPICAL | Status: DC | PRN
  Administered 2018-09-01: 1000 mL

## 2018-09-01 MED ORDER — LACTATED RINGERS IV SOLN
INTRAVENOUS | Status: DC
  Administered 2018-09-01: 13:00:00 via INTRAVENOUS

## 2018-09-01 MED ORDER — DEXAMETHASONE SODIUM PHOSPHATE 4 MG/ML IJ SOLN
INTRAMUSCULAR | Status: DC | PRN
  Administered 2018-09-01: 5 mg via INTRAVENOUS

## 2018-09-01 MED ORDER — PROMETHAZINE HCL 25 MG/ML IJ SOLN: 6.2500 mg | INTRAMUSCULAR | Status: DC | PRN

## 2018-09-01 MED ORDER — FENTANYL CITRATE (PF) 250 MCG/5ML IJ SOLN
INTRAMUSCULAR | Status: AC
  Filled 2018-09-01: qty 5

## 2018-09-01 MED ORDER — ONDANSETRON HCL 4 MG PO TABS
4.0000 mg | ORAL_TABLET | Freq: Four times a day (QID) | ORAL | Status: DC | PRN
  Administered 2018-09-05: 4 mg via ORAL
  Filled 2018-09-01: qty 1

## 2018-09-01 MED ORDER — METHOCARBAMOL 1000 MG/10ML IJ SOLN
500.0000 mg | Freq: Three times a day (TID) | INTRAVENOUS | Status: DC
  Filled 2018-09-01 (×24): qty 5

## 2018-09-01 MED ORDER — HYDROMORPHONE HCL 1 MG/ML IJ SOLN
INTRAMUSCULAR | Status: AC
  Administered 2018-09-01: 0.5 mg via INTRAVENOUS
  Filled 2018-09-01: qty 1

## 2018-09-01 MED ORDER — ACETAMINOPHEN 325 MG PO TABS
650.0000 mg | ORAL_TABLET | Freq: Four times a day (QID) | ORAL | Status: AC
  Administered 2018-09-01 – 2018-09-02 (×3): 650 mg via ORAL
  Filled 2018-09-01 (×4): qty 2

## 2018-09-01 MED ORDER — SUCCINYLCHOLINE 20MG/ML (10ML) SYRINGE FOR MEDFUSION PUMP - OPTIME
INTRAMUSCULAR | Status: DC | PRN
  Administered 2018-09-01: 120 mg via INTRAVENOUS

## 2018-09-01 MED ORDER — GABAPENTIN 300 MG PO CAPS
300.0000 mg | ORAL_CAPSULE | Freq: Two times a day (BID) | ORAL | Status: DC
  Administered 2018-09-01 – 2018-09-08 (×13): 300 mg via ORAL
  Filled 2018-09-01 (×13): qty 1

## 2018-09-01 MED ORDER — ONDANSETRON HCL 4 MG/2ML IJ SOLN
4.0000 mg | Freq: Four times a day (QID) | INTRAMUSCULAR | Status: DC | PRN
  Administered 2018-09-06 – 2018-09-08 (×2): 4 mg via INTRAVENOUS
  Filled 2018-09-01 (×2): qty 2

## 2018-09-01 MED ORDER — MOMETASONE FURO-FORMOTEROL FUM 200-5 MCG/ACT IN AERO
2.0000 | INHALATION_SPRAY | Freq: Two times a day (BID) | RESPIRATORY_TRACT | Status: DC
  Administered 2018-09-01 – 2018-09-08 (×15): 2 via RESPIRATORY_TRACT
  Filled 2018-09-01: qty 8.8

## 2018-09-01 MED ORDER — DEXMEDETOMIDINE HCL IN NACL 200 MCG/50ML IV SOLN: 0.4000 ug/kg/h | INTRAVENOUS | Status: DC

## 2018-09-01 MED ORDER — FENTANYL CITRATE (PF) 100 MCG/2ML IJ SOLN
INTRAMUSCULAR | Status: DC | PRN
  Administered 2018-09-01: 50 ug via INTRAVENOUS
  Administered 2018-09-01: 100 ug via INTRAVENOUS

## 2018-09-01 MED ORDER — POTASSIUM CHLORIDE IN NACL 20-0.9 MEQ/L-% IV SOLN
INTRAVENOUS | Status: DC
  Administered 2018-09-01 – 2018-09-04 (×3): via INTRAVENOUS
  Administered 2018-09-05: 125 mL/h via INTRAVENOUS
  Administered 2018-09-05: 15:00:00 via INTRAVENOUS
  Filled 2018-09-01 (×7): qty 1000

## 2018-09-01 MED ORDER — KETOROLAC TROMETHAMINE 15 MG/ML IJ SOLN
7.5000 mg | Freq: Four times a day (QID) | INTRAMUSCULAR | Status: AC
  Administered 2018-09-01 – 2018-09-03 (×8): 7.5 mg via INTRAVENOUS
  Filled 2018-09-01 (×9): qty 1

## 2018-09-01 MED ORDER — OXYCODONE HCL 5 MG PO TABS
10.0000 mg | ORAL_TABLET | ORAL | Status: DC | PRN
  Administered 2018-09-01 – 2018-09-04 (×5): 15 mg via ORAL
  Administered 2018-09-04: 10 mg via ORAL
  Administered 2018-09-05 (×2): 15 mg via ORAL
  Filled 2018-09-01 (×8): qty 3

## 2018-09-01 MED ORDER — ONDANSETRON HCL 4 MG/2ML IJ SOLN
INTRAMUSCULAR | Status: DC | PRN
  Administered 2018-09-01: 4 mg via INTRAVENOUS

## 2018-09-01 MED ORDER — LIDOCAINE 2% (20 MG/ML) 5 ML SYRINGE
INTRAMUSCULAR | Status: DC | PRN
  Administered 2018-09-01: 100 mg via INTRAVENOUS

## 2018-09-01 MED ORDER — HYDROMORPHONE HCL 1 MG/ML IJ SOLN
0.5000 mg | INTRAMUSCULAR | Status: DC | PRN
  Administered 2018-09-02 – 2018-09-05 (×6): 1 mg via INTRAVENOUS
  Filled 2018-09-01 (×6): qty 1

## 2018-09-01 MED ORDER — PANTOPRAZOLE SODIUM 40 MG PO TBEC
40.0000 mg | DELAYED_RELEASE_TABLET | Freq: Every day | ORAL | Status: DC
  Administered 2018-09-02 – 2018-09-08 (×6): 40 mg via ORAL
  Filled 2018-09-01 (×6): qty 1

## 2018-09-01 MED ORDER — DOCUSATE SODIUM 100 MG PO CAPS
100.0000 mg | ORAL_CAPSULE | Freq: Two times a day (BID) | ORAL | Status: DC
  Administered 2018-09-01 – 2018-09-08 (×13): 100 mg via ORAL
  Filled 2018-09-01 (×13): qty 1

## 2018-09-01 MED ORDER — BUPIVACAINE HCL (PF) 0.25 % IJ SOLN
INTRAMUSCULAR | Status: DC | PRN
  Administered 2018-09-01: 7 mL

## 2018-09-01 MED ORDER — PROPOFOL 10 MG/ML IV BOLUS
INTRAVENOUS | Status: DC | PRN
  Administered 2018-09-01: 200 mg via INTRAVENOUS

## 2018-09-01 SURGICAL SUPPLY — 58 items
BANDAGE ACE 4X5 VEL STRL LF (GAUZE/BANDAGES/DRESSINGS) IMPLANT
BANDAGE ACE 6X5 VEL STRL LF (GAUZE/BANDAGES/DRESSINGS) IMPLANT
BANDAGE ELASTIC 4 VELCRO ST LF (GAUZE/BANDAGES/DRESSINGS) ×4 IMPLANT
BANDAGE ELASTIC 6 VELCRO ST LF (GAUZE/BANDAGES/DRESSINGS) ×4 IMPLANT
BANDAGE ESMARK 6X9 LF (GAUZE/BANDAGES/DRESSINGS) ×2 IMPLANT
BAR GLASS FIBER EXFX 11X500 (EXFIX) ×8 IMPLANT
BNDG COHESIVE 6X5 TAN STRL LF (GAUZE/BANDAGES/DRESSINGS) IMPLANT
BNDG ESMARK 6X9 LF (GAUZE/BANDAGES/DRESSINGS) ×4
BNDG GAUZE ELAST 4 BULKY (GAUZE/BANDAGES/DRESSINGS) ×12 IMPLANT
BRUSH SCRUB SURG 4.25 DISP (MISCELLANEOUS) ×8 IMPLANT
CLOSURE WOUND 1/2 X4 (GAUZE/BANDAGES/DRESSINGS)
COVER SURGICAL LIGHT HANDLE (MISCELLANEOUS) ×4 IMPLANT
COVER WAND RF STERILE (DRAPES) ×4 IMPLANT
CUFF TOURNIQUET SINGLE 18IN (TOURNIQUET CUFF) IMPLANT
DRAPE C-ARM 42X72 X-RAY (DRAPES) ×3 IMPLANT
DRAPE C-ARMOR (DRAPES) ×4 IMPLANT
DRAPE U-SHAPE 47X51 STRL (DRAPES) ×4 IMPLANT
DRSG ADAPTIC 3X8 NADH LF (GAUZE/BANDAGES/DRESSINGS) ×4 IMPLANT
DRSG MEPITEL 4X7.2 (GAUZE/BANDAGES/DRESSINGS) IMPLANT
ELECT REM PT RETURN 9FT ADLT (ELECTROSURGICAL) ×4
ELECTRODE REM PT RTRN 9FT ADLT (ELECTROSURGICAL) ×2 IMPLANT
EVACUATOR 1/8 PVC DRAIN (DRAIN) IMPLANT
GAUZE SPONGE 4X4 12PLY STRL (GAUZE/BANDAGES/DRESSINGS) IMPLANT
GAUZE SPONGE 4X4 12PLY STRL LF (GAUZE/BANDAGES/DRESSINGS) ×4 IMPLANT
GLOVE BIO SURGEON STRL SZ7.5 (GLOVE) ×8 IMPLANT
GLOVE BIO SURGEON STRL SZ8 (GLOVE) ×8 IMPLANT
GLOVE BIOGEL PI IND STRL 7.5 (GLOVE) ×4 IMPLANT
GLOVE BIOGEL PI IND STRL 8 (GLOVE) ×4 IMPLANT
GLOVE BIOGEL PI INDICATOR 7.5 (GLOVE) ×4
GLOVE BIOGEL PI INDICATOR 8 (GLOVE) ×4
GOWN STRL REUS W/ TWL LRG LVL3 (GOWN DISPOSABLE) ×4 IMPLANT
GOWN STRL REUS W/ TWL XL LVL3 (GOWN DISPOSABLE) ×2 IMPLANT
GOWN STRL REUS W/TWL LRG LVL3 (GOWN DISPOSABLE) ×4
GOWN STRL REUS W/TWL XL LVL3 (GOWN DISPOSABLE) ×2
KIT BASIN OR (CUSTOM PROCEDURE TRAY) ×4 IMPLANT
KIT TURNOVER KIT B (KITS) ×4 IMPLANT
MANIFOLD NEPTUNE II (INSTRUMENTS) ×4 IMPLANT
NEEDLE 18GX1X1/2 (RX/OR ONLY) (NEEDLE) ×4 IMPLANT
NEEDLE 22X1 1/2 (OR ONLY) (NEEDLE) IMPLANT
NS IRRIG 1000ML POUR BTL (IV SOLUTION) ×4 IMPLANT
PACK ORTHO EXTREMITY (CUSTOM PROCEDURE TRAY) ×4 IMPLANT
PAD ABD 8X10 STRL (GAUZE/BANDAGES/DRESSINGS) ×4 IMPLANT
PAD ARMBOARD 7.5X6 YLW CONV (MISCELLANEOUS) ×8 IMPLANT
PADDING CAST COTTON 6X4 STRL (CAST SUPPLIES) ×8 IMPLANT
PIN CLAMP 2BAR 75MM BLUE (EXFIX) ×8 IMPLANT
PIN HALF ORANGE 5X200X45MM (EXFIX) ×8 IMPLANT
PIN HALF YELLOW 5X160X35 (EXFIX) ×8 IMPLANT
SET MONITOR QUICK PRESSURE (MISCELLANEOUS) ×4 IMPLANT
SPLINT PLASTER CAST XFAST 5X30 (CAST SUPPLIES) ×2 IMPLANT
SPLINT PLASTER XFAST SET 5X30 (CAST SUPPLIES) ×2
SPONGE LAP 18X18 RF (DISPOSABLE) ×4 IMPLANT
STAPLER VISISTAT 35W (STAPLE) IMPLANT
STOCKINETTE IMPERVIOUS LG (DRAPES) IMPLANT
STRIP CLOSURE SKIN 1/2X4 (GAUZE/BANDAGES/DRESSINGS) IMPLANT
SYR 30ML LL (SYRINGE) ×4 IMPLANT
TOWEL OR 17X24 6PK STRL BLUE (TOWEL DISPOSABLE) ×12 IMPLANT
TOWEL OR 17X26 10 PK STRL BLUE (TOWEL DISPOSABLE) ×8 IMPLANT
UNDERPAD 30X30 (UNDERPADS AND DIAPERS) ×4 IMPLANT

## 2018-09-01 NOTE — Anesthesia Preprocedure Evaluation (Signed)
Anesthesia Evaluation  Patient identified by MRN, date of birth, ID band Patient awake    Reviewed: Allergy & Precautions, NPO status , Patient's Chart, lab work & pertinent test results  Airway Mallampati: II  TM Distance: >3 FB Neck ROM: Full    Dental no notable dental hx.    Pulmonary asthma , former smoker,    Pulmonary exam normal breath sounds clear to auscultation       Cardiovascular negative cardio ROS Normal cardiovascular exam Rhythm:Regular Rate:Normal     Neuro/Psych negative neurological ROS  negative psych ROS   GI/Hepatic negative GI ROS, Neg liver ROS,   Endo/Other  negative endocrine ROS  Renal/GU negative Renal ROS  negative genitourinary   Musculoskeletal negative musculoskeletal ROS (+)   Abdominal   Peds negative pediatric ROS (+)  Hematology negative hematology ROS (+)   Anesthesia Other Findings   Reproductive/Obstetrics negative OB ROS                             Anesthesia Physical Anesthesia Plan  ASA: II  Anesthesia Plan: General   Post-op Pain Management:    Induction: Intravenous and Rapid sequence  PONV Risk Score and Plan: 2 and Ondansetron, Dexamethasone and Treatment may vary due to age or medical condition  Airway Management Planned: Oral ETT  Additional Equipment:   Intra-op Plan:   Post-operative Plan: Extubation in OR  Informed Consent: I have reviewed the patients History and Physical, chart, labs and discussed the procedure including the risks, benefits and alternatives for the proposed anesthesia with the patient or authorized representative who has indicated his/her understanding and acceptance.     Dental advisory given  Plan Discussed with: CRNA and Surgeon  Anesthesia Plan Comments:         Anesthesia Quick Evaluation

## 2018-09-01 NOTE — Anesthesia Procedure Notes (Signed)
Procedure Name: Intubation Date/Time: 09/01/2018 2:46 PM Performed by: Tillman Abide, CRNA Pre-anesthesia Checklist: Patient identified, Emergency Drugs available, Suction available and Patient being monitored Patient Re-evaluated:Patient Re-evaluated prior to induction Oxygen Delivery Method: Circle System Utilized Preoxygenation: Pre-oxygenation with 100% oxygen Induction Type: IV induction Ventilation: Mask ventilation without difficulty Laryngoscope Size: Miller and 2 Grade View: Grade I Tube type: Oral Number of attempts: 1 Airway Equipment and Method: Stylet Placement Confirmation: ETT inserted through vocal cords under direct vision,  positive ETCO2 and breath sounds checked- equal and bilateral Secured at: 22 cm Tube secured with: Tape Dental Injury: Teeth and Oropharynx as per pre-operative assessment

## 2018-09-01 NOTE — Progress Notes (Signed)
Subjective: Pain ok   Objective: Vital signs in last 24 hours: Temp:  [98.4 F (36.9 C)-99.2 F (37.3 C)] 99.2 F (37.3 C) (04/13 2314) Pulse Rate:  [82-93] 82 (04/13 2314) Resp:  [18] 18 (04/13 2314) BP: (110-147)/(73-88) 147/88 (04/13 2314) SpO2:  [97 %-100 %] 100 % (04/13 2314) Weight:  [83.9 kg] 83.9 kg (04/13 1739)  Intake/Output from previous day: 04/13 0701 - 04/14 0700 In: 972.7 [I.V.:472.7; IV Piggyback:500] Out: 800 [Urine:800] Intake/Output this shift: No intake/output data recorded.  Exam:  Intact pulses distally Dorsiflexion/Plantar flexion intact Compartment soft  Labs: Recent Labs    08/31/18 2115  HGB 15.3   Recent Labs    08/31/18 2115  WBC 16.0*  RBC 5.33  HCT 47.3  PLT 241   Recent Labs    08/31/18 2115  NA 141  K 4.8  CL 110  CO2 23  BUN 8  CREATININE 1.40*  GLUCOSE 101*  CALCIUM 8.3*   No results for input(s): LABPT, INR in the last 72 hours.  Assessment/Plan: Plan ex fix today with Dr Carola Frost and transfer to OTS   [employer] 09/01/2018, 9:00 AM

## 2018-09-01 NOTE — Consult Note (Signed)
Orthopaedic Trauma Service (OTS) Consult   Patient ID: Jacob Ho MRN: [id number] DOB/AGE: 1976/02/22 43 y.o.   Reason for Consult: Complex R tibial plateau fracture  Referring Physician: Burnard Bunting, MD (Orthopaedics)   HPI: Jacob Ho is an 43 y.o. black male who was injured yesterday evening supposedly running from [facility].  Patient apparently jumped up on an object and then jumped down sustaining an injury to his right knee.  Patient attempted to bear weight but was unable to do so.  He was brought to [facility] for evaluation where he was found to have a complex right tibial plateau fracture.  No additional injuries were noted.  Dr. August Saucer who is on-call for unassigned orthopedics was contacted yesterday.  Patient was placed into a long-leg splint with anticipation to transfer to the orthopedic trauma service on 08/31/2020 proceed to the OR for external fixation followed by CT scan to fully evaluate the degree of injury to the tibial plateau.  Patient seen and evaluated by the orthopedic trauma service on 09/01/2018.  He is in room 6 N. 17 he is accompanied by [facility] deputies.  Patient is in custody and is in handcuffs.  His left wrist is cuffed to the bed.  Patient did not go much into the details of the incident.  He complains of right knee pain but it is fairly well-controlled right now as he is just received some pain medicine.  He does note some mild tingling in his right foot and ankle but nothing too severe.  Denies any additional injuries elsewhere.  Pain is relieved with rest and pain medication.  It is exacerbated with movement.  It has essentially stayed the same since admission possibly a little bit better.   Patient also states that he is upset that he has been unable to call family and that no one is letting him call anybody.  However I discussed this with the nurse to find out proper protocol for establishing  connection with family with patient in custody but she states that he has been on the phone this morning.   Patient states that he smokes about 3 cigarettes every other day.  Denies any other drug use or alcohol use.  He lives in [place].   states that his only medical issue is asthma.  [facility] and gets his medications through here.  He believes he was last seen December 2019 however I do not see any record in epic of a visit around this timeframe   Admission labs reviewed.  He does have mildly elevated creatinine which may likely be due to dehydration.  His urinalysis is also questionable hazy with ketones and glucose.   Patient acknowledges that he has a family history of diabetes in his mother and his brother although he denies this history.  Will check A1c while he is admitted.   Mild leukocytosis and left shift with elevated neutrophils, likely stress-induced due to acute trauma  Past Medical History:  Diagnosis Date  . Asthma     History reviewed. No pertinent surgical history.  Family History  Problem Relation Age of Onset  . Diabetes Mother   . Asthma Mother   . Heart attack Father   . Asthma Sister     Social History:  reports that he quit smoking about 4 years ago. He started smoking about 27 years ago. He has never used  smokeless tobacco. He reports current drug use. He reports that he does not drink alcohol.  Allergies:  Allergies  Allergen Reactions  . Shellfish Allergy Anaphylaxis  . Iodine     Medications:  I have reviewed the patient's current medications. Prior to Admission:  Medications Prior to Admission  Medication Sig Dispense Refill Last Dose  . mometasone-formoterol (DULERA) 200-5 MCG/ACT AERO Inhale 2 puffs into the lungs 2 (two) times daily. 1 Inhaler 6 08/18/2018  . Cetirizine HCl 10 MG CAPS Take 1 capsule (10 mg total) by mouth 2 (two) times daily for 4 days. (Patient not taking: Reported on  12/31/2017) 20 capsule 0 Completed Course at Unknown time  . doxycycline (VIBRAMYCIN) 100 MG capsule Take 1 capsule (100 mg total) by mouth 2 (two) times daily. (Patient not taking: Reported on 09/01/2018) 20 capsule 0 Not Taking at Unknown time  . ondansetron (ZOFRAN ODT) 4 MG disintegrating tablet Take 1 tablet (4 mg total) by mouth every 8 (eight) hours as needed for nausea or vomiting. (Patient not taking: Reported on 09/01/2018) 10 tablet 0 Not Taking at Unknown time    Results for orders placed or performed during the hospital encounter of 08/31/18 (from the past 48 hour(s))  CBC with Differential     Status: Abnormal   Collection Time: 08/31/18  9:15 PM  Result Value Ref Range   WBC 16.0 (H) 4.0 - 10.5 K/uL   RBC 5.33 4.22 - 5.81 MIL/uL   Hemoglobin 15.3 13.0 - 17.0 g/dL   HCT 16.1 09.6 - 04.5 %   MCV 88.7 80.0 - 100.0 fL   MCH 28.7 26.0 - 34.0 pg   MCHC 32.3 30.0 - 36.0 g/dL   RDW 40.9 81.1 - 91.4 %   Platelets 241 150 - 400 K/uL   nRBC 0.0 0.0 - 0.2 %   Neutrophils Relative % 82 %   Neutro Abs 13.1 (H) 1.7 - 7.7 K/uL   Lymphocytes Relative 10 %   Lymphs Abs 1.7 0.7 - 4.0 K/uL   Monocytes Relative 7 %   Monocytes Absolute 1.2 (H) 0.1 - 1.0 K/uL   Eosinophils Relative 0 %   Eosinophils Absolute 0.0 0.0 - 0.5 K/uL   Basophils Relative 0 %   Basophils Absolute 0.0 0.0 - 0.1 K/uL   Immature Granulocytes 1 %   Abs Immature Granulocytes 0.09 (H) 0.00 - 0.07 K/uL    Comment: Performed at [facility], [address] [place], [place] [address]  Basic metabolic panel     Status: Abnormal   Collection Time: 08/31/18  9:15 PM  Result Value Ref Range   Sodium 141 135 - 145 mmol/L   Potassium 4.8 3.5 - 5.1 mmol/L   Chloride 110 98 - 111 mmol/L   CO2 23 22 - 32 mmol/L   Glucose, Bld 101 (H) 70 - 99 mg/dL   BUN 8 6 - 20 mg/dL   Creatinine, Ser 6.21 (H) 0.61 - 1.24 mg/dL   Calcium 8.3 (L) 8.9 - 10.3 mg/dL   GFR calc non Af Amer >60 >60 mL/min   GFR calc Af Amer >60 >60 mL/min    Anion gap 8 5 - 15    Comment: Performed at [facility], [address] [place], [place] [address]  Surgical pcr screen     Status: None   Collection Time: 08/31/18 11:35 PM  Result Value Ref Range   MRSA, PCR NEGATIVE NEGATIVE   Staphylococcus aureus NEGATIVE NEGATIVE  Comment: (NOTE) The Xpert SA Assay (FDA approved for NASAL specimens in patients 45 years of age and older), is one component of a comprehensive surveillance program. It is not intended to diagnose infection nor to guide or monitor treatment. Performed at [facility], [address] [place], [place] [id number]     Dg Tibia/fibula Right  Result Date: 08/31/2018 CLINICAL DATA:  Knee pain after jumping injury EXAM: RIGHT TIBIA AND FIBULA - 2 VIEW COMPARISON:  None. FINDINGS: There is a comminuted fracture of the proximal right tibia, extending from the medial surface to the lateral tibial plateau. There is dorsal and inferior displacement of fracture fragments. IMPRESSION: Comminuted, intra-articular fracture of the proximal right tibia. CT may be helpful for further characterization fracture pattern. Electronically Signed   By: Deatra RobinsonKevin  Herman M.D.   On: 08/31/2018 19:43   Dg Knee Complete 4 Views Right  Result Date: 08/31/2018 CLINICAL DATA:  43 year old male with a jumping injury EXAM: RIGHT KNEE - COMPLETE 4+ VIEW COMPARISON:  None. FINDINGS: Comminuted fracture of the right tibial plateau is multiple fracture fragments. Joint effusion. IMPRESSION: Acute comminuted fracture of the tibial plateau with joint effusion Electronically Signed   By: Gilmer MorJaime  Wagner D.O.   On: 08/31/2018 19:42    Review of Systems  Constitutional: Negative for chills and fever.  Respiratory: Positive for wheezing. Negative for shortness of breath.   Cardiovascular: Negative for chest pain and palpitations.  Gastrointestinal: Negative for abdominal pain, nausea and vomiting.  Neurological: Negative for tingling and sensory  change.   Blood pressure (!) 147/88, pulse 82, temperature 99.2 F (37.3 C), temperature source Oral, resp. rate 18, SpO2 100 %. Physical Exam Vitals signs and nursing note reviewed.  Constitutional:      General: He is awake.     Comments: Older appearing black male, again he is in custody, shares that.  He is present in the room. Patient was using profanity at times but overall was quite respectful.  Profanity not specifically directed anybody directed but directed at the fact that he states he is been unable to call anybody.  HENT:     Mouth/Throat:     Mouth: Mucous membranes are moist.     Dentition: Abnormal dentition.  Cardiovascular:     Rate and Rhythm: Normal rate and regular rhythm.     Heart sounds: S1 normal and S2 normal.  Pulmonary:     Effort: No accessory muscle usage or respiratory distress.     Comments: Wheezing bilaterally Abdominal:     Comments: Soft, NTND, + BS  Musculoskeletal:     Comments: Pelvis--no traumatic wounds or rash, no ecchymosis, stable to manual stress, nontender  Right Lower Extremity  Inspection:    Patient is in a long-leg splint however it does not capture his ankle.  Ankle is free    Mild swelling distally at the ankle and foot    Knee is in a flexed position of around 35 to 40 degrees  Bony eval:    Hip is nontender     Distal femur is nontender    Ankle and foot are nontender    Exquisite tenderness palpation proximal tibia   Soft tissue: Ace wrap partially parted along the proximal tibia.  There is no compressive wrap other than the Ace wrap.  There is no cast padding on the skin.  There is fairly moderate swelling present but no fracture blisters identified from the soft tissue that was examined.  I did not  fully remove the Ace wrap the Ace wrap is holding the splint in place. Distally there is moderate swelling present  Sensation: DPN, SPN grossly intact TN is intact but mildly decreased sensation  Motor: EHL, FHL, lesser  toe motor functions are intact Ankle extension, flexion, inversion eversion grossly intact as well Vascular: Extremity is warm + DP pulse Compartments are full but compressible Patient does have some pain with passive extension of his toes and ankle but not out of proportion  Left lower extremity             no open wounds or lesions, no swelling or ecchymosis   Nontender hip, knee, ankle and foot             No crepitus or gross motion noted with manipulation of the Left leg  No knee or ankle effusion             No pain with axial loading or logrolling of the hip. Negative Stinchfield test   Knee stable to varus/ valgus and anterior/posterior stress             No pain with manipulation of the ankle or foot             No blocks to motion noted  Sens DPN, SPN, TN intact  Motor EHL, FHL, lesser toe motor, Ext, flex, evers 5/5  DP 2+, No significant edema             Compartments are soft and nontender, no pain with passive stretching  Bilateral upper extremities UEx shoulder, elbow, wrist, digits- no skin wounds, nontender, no instability, no blocks to motion  Sens  Ax/R/M/U intact  Mot   Ax/ R/ PIN/ M/ AIN/ U intact  Rad 2+    Neurological:     Mental Status: He is alert and oriented to person, place, and time.     Comments: Unable to assess gait or coordination  Psychiatric:        Attention and Perception: Attention normal.        Mood and Affect: Mood and affect normal.        Behavior: Behavior is cooperative.      Assessment/Plan:  43 year old male with complex right bicondylar tibial plateau fracture  -Fall from height while trying to elude long enforcement  -Complex right bicondylar tibial plateau fracture, appears to have a significant shear component  OR today for application of a spanning external fixator to the knee  Reevaluate his compartments in the OR but he does not exhibit any signs or symptoms of impending compartment syndrome  Aggressive ice,  elevation above heart and compressive wrap to help with swelling control   CT scan post application of spanning external fixator.  CT scan yesterday was appropriately canceled by Dr. August Saucer as more value would be obtained getting a CT scan post fixator placement   Would anticipate return to the OR in 7 to 10 days for definitive ORIF.  Plan to be determined based off of CT scan.    Patient will be nonweightbearing for 8 weeks after definitive fixation   He will be permitted full range of motion after definitive fixation as well   We will likely have OT fabricate foot splint to help him maintain a neutral position while the fixator is in place and to prevent the development of a ankle flexion contracture   - ABL anemia/Hemodynamics  Stable  Some mild hypertension but may be related to pain.  Will monitor  but may have some undiagnosed hypertension  - Medical issues   Asthma   Albuterol nebulizer ordered preop as patient has active wheezing   Home medications reordered   Elevated creatinine   Suspect this is related to some mild dehydration   Patient is on IV fluids, recheck labs, monitor   Mild leukocytosis with left shift   Suspect this is stress reaction.  Patient is afebrile   ?  Urinalysis   Ketones and glucose noted on UA   Check A1c while hospitalized   Nicotine dependence   Discussed the negative effects of nicotine on bone and wound healing   Patient smokes a minimal amount anyway so should be fairly easy for him to stop.  - DVT/PE prophylaxis:  Lovenox postoperatively  Will need match program that would anticipate placing him 4 weeks  SCDs - ID:   Perioperative antibiotics - Metabolic Bone Disease:  Check vitamin d  - Activity:  Nonweightbearing right lower extremity  PT and OT evaluations postop  - FEN/GI prophylaxis/Foley/Lines:  NPO  IVF   protonix   - Impediments to fracture healing:  Nicotine use   - Dispo:   OR today for Ex Fix placement   CT scan  post op   Continue with inpatient hospitalization  Definitive plan to be determined  Will need social Automotive engineer to discuss with long enforcement custody arrangements.  Patient may need to stay in hospital until definitive fixation can be performed and any may need to go to subsequent venue    Mearl Latin, PA-C [phone] (C) 09/01/2018, 11:43 AM  [facility] [address] [place] [address] [phone] Collier Bullock (F)

## 2018-09-01 NOTE — Op Note (Signed)
09/01/2018  4:43 PM  PATIENT:  Jacob Ho  43 y.o. male  PRE-OPERATIVE DIAGNOSIS:   1. RIGHT TIBIAL PLATEAU FRACTURE 2. RIGHT LEG SWELLING 3. RIGHT KNEE HEMATOMA  POST-OPERATIVE DIAGNOSIS:   1. RIGHT TIBIAL PLATEAU FRACTURE 2. RIGHT LEG SWELLING 3. RIGHT KNEE HEMATOMA  PROCEDURE:   1. CLOSED REDUCTION OF RIGHT TIBIAL PLATEAU 2. APPLICATION OF SPANNING EXTERNAL FIXATOR 3. MEASUREMENT OF COMPARTMENT PRESSURES ALL FOUR COMPARTMENTS 4. RIGHT KNEE ASPIRATION  SURGEON:  Surgeon(s) and Role:    Myrene Galas, MD - Primary  ASSISTANTS: RNFA   ANESTHESIA:   general  EBL:  Minimal   BLOOD ADMINISTERED:none  DRAINS: none   LOCAL MEDICATIONS USED:  MARCAINE     SPECIMEN:  No Specimen  DISPOSITION OF SPECIMEN:  N/A  COUNTS:  YES  TOURNIQUET:  * No tourniquets in log *  DICTATION: .Note written in EPIC  PLAN OF CARE: Admit to inpatient   PATIENT DISPOSITION:  PACU - hemodynamically stable.   Delay start of Pharmacological VTE agent (>24hrs) due to surgical blood loss or risk of bleeding: no  BRIEF SUMMARY OF INDICATIONS FOR PROCEDURE:  Jacob Ho is a 43 y.o. -year-old who sustained knee injury from jumping into a ravine with severe impacted and displaced fractures of the right tibial plateau. The patient was seen and evaluated preoperatively where substantial swelling and pain but no pain with passive stretch was noted. I discussed with him the possibility of compartment release as well as spanning external fixation with delayed definitive internal fixation.  Risks discussed include pin tract infection, the need for staged procedures, heart attack, stroke, anesthetic reaction, possible need for skin grafting, malunion, nonunion, infection, and multiple others.    BRIEF SUMMARY OF PROCEDURE:  After administration of preoperative antibiotics, the patient was taken to the operating room where general anesthesia was induced. The right lower extremity was prepped  and draped in usual sterile fashion while carefully holding traction.  After time-out, C-arm was brought in.  Appropriate starting points were identified on the femur and tibia using a clamp for proper spacing and orientation.  Small stab incisions were made.  Tonsil clamp used to spread down to bone.  The pins were advanced to the far cortex.  These were then checked with lateral flouro images to confirm appropriate position just into the far cortex.  After applying and securing clamps on the femur and the tibia, two bars were placed and a closed manipulation performed of the tibial plateau area restoring appropriate length, alignment, and rotation.  Once this was confirmed with AP and lateral C-arm images, my assistant terminally tightened all of the clamps. We dressed the pin sites with Kerlix and applied a sterile gently compressive dressing from foot to thigh.   Because of the swelling and tightness of the compartments, I used the manometer and Stryker device to check the leg's compartment pressures, finding 40 and 46 in the anterior and lateral, 47 in the superficial posterior, and 42 in the deep posterior. His diastolic pressure was 88 preoperatively.  Consequently, given his lack of clinical symptoms and signs and the measurements that were over less than diastolic pressure, we chose not to proceed with 4 compartment fasciotomy.  We then turned our attention to the large knee hemarthrosis, which contributed to soft tissue swelling and internal pressure on the skin. Using an 18 gauge needle and 30 cc syringe, 90 cc of sanguinous fluid was aspirated from the knee.  Sterile gently compressive dressings were then  applied from foot to ankle using two Ace wraps. An assistant was absolutely necessary as I had to produce and control the reduction while an assistant adjusted and tightened the clamps.  PROGNOSIS:  The patient will need to return to the OR for definitive treatment. CT scan will be  ordered for surgical planning and DVT prophylaxis initiated. The magnitude of articular and soft tissue injury also increases the risks of arthritis and soft tissue complications including infection.      Jacob Ho, M.D.  

## 2018-09-01 NOTE — Progress Notes (Signed)
Orthopedic Tech Progress Note Patient Details:  Jacob Ho Aug 25, 1975 [id number]  Ortho Devices Type of Ortho Device: Long leg splint Ortho Device/Splint Location: Trapeze bar Ortho Device/Splint Interventions: Application   Post Interventions Patient Tolerated: Well Instructions Provided: Care of device   Saul Fordyce 09/01/2018, 5:35 PM

## 2018-09-01 NOTE — Plan of Care (Signed)
  Problem: Coping: Goal: Level of anxiety will decrease Outcome: Progressing   Problem: Pain Managment: Goal: General experience of comfort will improve Outcome: Progressing   

## 2018-09-01 NOTE — Brief Op Note (Signed)
09/01/2018  4:43 PM  PATIENT:  Jacob Ho  43 y.o. male  PRE-OPERATIVE DIAGNOSIS:   1. RIGHT TIBIAL PLATEAU FRACTURE 2. RIGHT LEG SWELLING 3. RIGHT KNEE HEMATOMA  POST-OPERATIVE DIAGNOSIS:   1. RIGHT TIBIAL PLATEAU FRACTURE 2. RIGHT LEG SWELLING 3. RIGHT KNEE HEMATOMA  PROCEDURE:   1. CLOSED REDUCTION OF RIGHT TIBIAL PLATEAU 2. APPLICATION OF SPANNING EXTERNAL FIXATOR 3. MEASUREMENT OF COMPARTMENT PRESSURES ALL FOUR COMPARTMENTS 4. RIGHT KNEE ASPIRATION  SURGEON:  Surgeon(s) and Role:    Myrene Galas, MD - Primary  ASSISTANTS: RNFA   ANESTHESIA:   general  EBL:  Minimal   BLOOD ADMINISTERED:none  DRAINS: none   LOCAL MEDICATIONS USED:  MARCAINE     SPECIMEN:  No Specimen  DISPOSITION OF SPECIMEN:  N/A  COUNTS:  YES  TOURNIQUET:  * No tourniquets in log *  DICTATION: .Note written in EPIC  PLAN OF CARE: Admit to inpatient   PATIENT DISPOSITION:  PACU - hemodynamically stable.   Delay start of Pharmacological VTE agent (>24hrs) due to surgical blood loss or risk of bleeding: no  BRIEF SUMMARY OF INDICATIONS FOR PROCEDURE:  Jacob Ho is a 43 y.o. -year-old who sustained knee injury from jumping into a ravine with severe impacted and displaced fractures of the right tibial plateau. The patient was seen and evaluated preoperatively where substantial swelling and pain but no pain with passive stretch was noted. I discussed with him the possibility of compartment release as well as spanning external fixation with delayed definitive internal fixation.  Risks discussed include pin tract infection, the need for staged procedures, heart attack, stroke, anesthetic reaction, possible need for skin grafting, malunion, nonunion, infection, and multiple others.    BRIEF SUMMARY OF PROCEDURE:  After administration of preoperative antibiotics, the patient was taken to the operating room where general anesthesia was induced. The right lower extremity was prepped  and draped in usual sterile fashion while carefully holding traction.  After time-out, C-arm was brought in.  Appropriate starting points were identified on the femur and tibia using a clamp for proper spacing and orientation.  Small stab incisions were made.  Tonsil clamp used to spread down to bone.  The pins were advanced to the far cortex.  These were then checked with lateral flouro images to confirm appropriate position just into the far cortex.  After applying and securing clamps on the femur and the tibia, two bars were placed and a closed manipulation performed of the tibial plateau area restoring appropriate length, alignment, and rotation.  Once this was confirmed with AP and lateral C-arm images, my assistant terminally tightened all of the clamps. We dressed the pin sites with Kerlix and applied a sterile gently compressive dressing from foot to thigh.   Because of the swelling and tightness of the compartments, I used the manometer and Stryker device to check the leg's compartment pressures, finding 40 and 45 in the anterior and lateral, 45 in the superficial posterior, and 45 in the deep posterior. His diastolic pressure was 88 preoperatively.  Consequently, given his lack of clinical symptoms and signs and the measurements that were over less than diastolic pressure, we chose not to proceed with 4 compartment fasciotomy.  We then turned our attention to the large knee hemarthrosis, which contributed to soft tissue swelling and internal pressure on the skin. Using an 18 gauge needle and 30 cc syringe, 90 cc of sanguinous fluid was aspirated from the knee.  Sterile gently compressive dressings were then  applied from foot to ankle using two Ace wraps. An assistant was absolutely necessary as I had to produce and control the reduction while an assistant adjusted and tightened the clamps.  PROGNOSIS:  The patient will need to return to the OR for definitive treatment. CT scan will be  ordered for surgical planning and DVT prophylaxis initiated. The magnitude of articular and soft tissue injury also increases the risks of arthritis and soft tissue complications including infection.      Jacob AlbinoMichael H. Carola Ho, M.D.

## 2018-09-01 NOTE — OR Nursing (Signed)
PT requested that Dr. Carola Frost call his mother after the procedure to let her know he was in the hospital Jacob Ho [phone]

## 2018-09-01 NOTE — Progress Notes (Signed)
The patient has been sleeping each time rounds have been made.  No respiratory distress noted.  The patient called out "wanting his medication for asthma"  Oxygen saturation is 100% when checked at the bedside

## 2018-09-01 NOTE — Transfer of Care (Signed)
Immediate Anesthesia Transfer of Care Note  Patient: Jacob Ho  Procedure(s) Performed: EXTERNAL FIXATION LEG (Right Leg Lower)  Patient Location: PACU  Anesthesia Type:General  Level of Consciousness: awake, drowsy and patient cooperative  Airway & Oxygen Therapy: Patient Spontanous Breathing  Post-op Assessment: Report given to RN and Post -op Vital signs reviewed and stable  Post vital signs: Reviewed and stable  Last Vitals:  Vitals Value Taken Time  BP 115/76 09/01/2018  3:58 PM  Temp    Pulse    Resp 21 09/01/2018  3:59 PM  SpO2    Vitals shown include unvalidated device data.  Last Pain:  Vitals:   09/01/18 1303  TempSrc:   PainSc: 5       Patients Stated Pain Goal: 0 (08/31/18 2314)  Complications: No apparent anesthesia complications

## 2018-09-02 ENCOUNTER — Encounter (HOSPITAL_COMMUNITY): Payer: Self-pay | Admitting: Orthopedic Surgery

## 2018-09-02 LAB — BASIC METABOLIC PANEL
Anion gap: 7 (ref 5–15)
BUN: 7 mg/dL (ref 6–20)
CO2: 23 mmol/L (ref 22–32)
Calcium: 8.4 mg/dL — ABNORMAL LOW (ref 8.9–10.3)
Chloride: 109 mmol/L (ref 98–111)
Creatinine, Ser: 1.18 mg/dL (ref 0.61–1.24)
GFR calc Af Amer: >60 mL/min (ref >60)
GFR calc non Af Amer: >60 mL/min (ref >60)
Glucose, Bld: 121 mg/dL — ABNORMAL HIGH (ref 70–99)
Potassium: 4.3 mmol/L (ref 3.5–5.1)
Sodium: 139 mmol/L (ref 135–145)

## 2018-09-02 LAB — CBC
HCT: 40.1 % (ref 39.0–52.0)
Hemoglobin: 13.3 g/dL (ref 13.0–17.0)
MCH: 29 pg (ref 26.0–34.0)
MCHC: 33.2 g/dL (ref 30.0–36.0)
MCV: 87.4 fL (ref 80.0–100.0)
Platelets: 189 10*3/uL (ref 150–400)
RBC: 4.59 MIL/uL (ref 4.22–5.81)
RDW: 14 % (ref 11.5–15.5)
WBC: 12 10*3/uL — ABNORMAL HIGH (ref 4.0–10.5)
nRBC: 0 % (ref 0.0–0.2)

## 2018-09-02 LAB — CALCITRIOL (1,25 DI-OH VIT D): Vit D, 1,25-Dihydroxy: 37.5 pg/mL (ref 19.9–79.3)

## 2018-09-02 LAB — VITAMIN D 25 HYDROXY (VIT D DEFICIENCY, FRACTURES): Vit D, 25-Hydroxy: 8.3 ng/mL — ABNORMAL LOW (ref 30.0–100.0)

## 2018-09-02 LAB — HEMOGLOBIN A1C
Hgb A1c MFr Bld: 5.1 % (ref 4.8–5.6)
Mean Plasma Glucose: 99.67 mg/dL

## 2018-09-02 NOTE — Plan of Care (Signed)
  Problem: Elimination: Goal: Will not experience complications related to bowel motility Outcome: Progressing   Problem: Coping: Goal: Level of anxiety will decrease Outcome: Progressing   

## 2018-09-02 NOTE — Anesthesia Postprocedure Evaluation (Signed)
Anesthesia Post Note  Patient: Pramod Hardiman  Procedure(s) Performed: EXTERNAL FIXATION LEG (Right Leg Lower) Fine Needle Aspiration (Right Knee)     Patient location during evaluation: PACU Anesthesia Type: General Level of consciousness: awake and alert Pain management: pain level controlled Vital Signs Assessment: post-procedure vital signs reviewed and stable Respiratory status: spontaneous breathing, nonlabored ventilation, respiratory function stable and patient connected to nasal cannula oxygen Cardiovascular status: blood pressure returned to baseline and stable Postop Assessment: no apparent nausea or vomiting Anesthetic complications: no    Last Vitals:  Vitals:   09/01/18 2339 09/02/18 0518  BP: 121/87 132/83  Pulse: 75 (!) 59  Resp: 16 16  Temp: 36.9 C 36.9 C  SpO2: 100% 100%    Last Pain:  Vitals:   09/02/18 0518  TempSrc: Oral  PainSc:                  Lailany Enoch S

## 2018-09-02 NOTE — Evaluation (Signed)
Physical Therapy Evaluation Patient Details Name: Jacob Ho MRN: [id number] DOB: 10/05/1975 Today's Date: 09/02/2018   History of Present Illness  Pt is a 43 y.o. male admitted 08/31/18 after sustaining R [place]. Now s/p R lower leg external fixation 4/14. May return to OR on 09/04/18 for ORIF of right tibial plateau fx. PMH includes asthma.    Clinical Impression  Pt presents with an overall decrease in functional mobility secondary to above. PTA, pt indep. Educ on precautions, positioning, and importance of mobility. Today, pt able to initiate gait training with crutches requiring modA for stability; limited by c/o significant RLE pain. Recommend use of RW for added stability with nursing staff. Will follow acutely to address established goals.     Follow Up Recommendations Follow surgeon's recommendation for DC plan and follow-up therapies    Equipment Recommendations  Crutches    Recommendations for Other Services       Precautions / Restrictions Precautions Precautions: Fall Restrictions Weight Bearing Restrictions: Yes RLE Weight Bearing: Non weight bearing      Mobility  Bed Mobility Overal bed mobility: Needs Assistance Bed Mobility: Supine to Sit     Supine to sit: Min assist     General bed mobility comments: MinA to assist RLE to EOB  Transfers Overall transfer level: Needs assistance Equipment used: Crutches Transfers: Sit to/from Stand Sit to Stand: Mod assist;+2 safety/equipment Stand pivot transfers: Mod assist;Min assist;+2 safety/equipment       General transfer comment: Very unsteady with significant RLE pain upon standing requiring modA to steady and assist for navigating bilateral crutches under arms  Ambulation/Gait Ambulation/Gait assistance: Min assist;+2 safety/equipment Gait Distance (Feet): 2 Feet Assistive device: Crutches   Gait velocity: Decreased   General Gait  Details: Pt able to take a few steps with minA+2 for steady assist to prevent LOB; very limited by pain requiring quick seated rest  Stairs            Wheelchair Mobility    Modified Rankin (Stroke Patients Only)       Balance Overall balance assessment: Needs assistance Sitting-balance support: Feet supported Sitting balance-Leahy Scale: Good     Standing balance support: Bilateral upper extremity supported Standing balance-Leahy Scale: Poor                               Pertinent Vitals/Pain Pain Assessment: 0-10 Pain Score: 10-Worst pain ever Pain Location: RLE when standing Pain Descriptors / Indicators: Sharp;Grimacing;Moaning Pain Intervention(s): Limited activity within patient's tolerance;Monitored during session;Repositioned    Home Living Family/patient expects to be discharged to:: [facility]                      Prior Function Level of Independence: Independent               Hand Dominance        Extremity/Trunk Assessment   Upper Extremity Assessment Upper Extremity Assessment: Overall WFL for tasks assessed    Lower Extremity Assessment Lower Extremity Assessment: RLE deficits/detail RLE Deficits / Details: s/p R tibial ex fix; hip flex strong although limited by pain       Communication   Communication: No difficulties  Cognition Arousal/Alertness: Awake/alert Behavior During Therapy: WFL for tasks assessed/performed Overall Cognitive Status: Within Functional Limits for tasks assessed  General Comments      Exercises     Assessment/Plan    PT Assessment Patient needs continued PT services  PT Problem List Decreased strength;Decreased range of motion;Decreased activity tolerance;Decreased balance;Decreased mobility;Pain;Decreased knowledge of use of DME;Decreased knowledge of precautions       PT Treatment Interventions DME  instruction;Gait training;Stair training;Functional mobility training;Therapeutic activities;Therapeutic exercise;Balance training;Patient/family education    PT Goals (Current goals can be found in the Care Plan section)  Acute Rehab PT Goals Patient Stated Goal:  less pain PT Goal Formulation: With patient Time For Goal Achievement: 09/16/18 Potential to Achieve Goals: Good    Frequency Min 5X/week   Barriers to discharge        Co-evaluation PT/OT/SLP Co-Evaluation/Treatment: Yes Reason for Co-Treatment: For patient/therapist safety;To address functional/ADL transfers PT goals addressed during session: Mobility/safety with mobility;Balance         AM-PAC PT "6 Clicks" Mobility  Outcome Measure Help needed turning from your back to your side while in a flat bed without using bedrails?: A Little Help needed moving from lying on your back to sitting on the side of a flat bed without using bedrails?: A Little Help needed moving to and from a bed to a chair (including a wheelchair)?: A Little Help needed standing up from a chair using your arms (e.g., wheelchair or bedside chair)?: A Little Help needed to walk in hospital room?: A Lot Help needed climbing 3-5 steps with a railing? : Total 6 Click Score: 15    End of Session   Activity Tolerance: Patient limited by pain Patient left: in chair;with call bell/phone within reach;Other (comment)(with police escort present) Nurse Communication: Mobility status PT Visit Diagnosis: Other abnormalities of gait and mobility (R26.89)    Time: 6789-3810 PT Time Calculation (min) (ACUTE ONLY): 21 min   Charges:   PT Evaluation $PT Eval Low Complexity: 1 Low        Ina Homes, PT, DPT Acute Rehabilitation Services  Pager [phone] Office [phone]  Malachy Chamber 09/02/2018, 4:21 PM

## 2018-09-02 NOTE — TOC Initial Note (Addendum)
Transition of Care [facility]) - Initial/Assessment Note    Patient Details  Name: Jacob Ho MRN: [id number] Date of Birth: 1975-12-14  [facility] CM/SW Contact:    Durenda Guthrie, RN Phone Number: 09/02/2018, 11:48 AM  Clinical Narrative: 08/31/18  43 yr old male admitted with right tibial plateau fracture. 09/01/18 external fixator was placed. Depending on swelling,patient may  return to OR on Friday, 09/04/18 for ORIF of right tibial plateau fracture.   Patient is in police custody at this time and will discharge to jail when medically ready. CM will contact Nurse at [facility] concerning sending DME with patient. CM is not able to schedule CHW appointment   CM will continue to follow.  Expected Discharge Plan: [facility] Barriers to Discharge: No Barriers Identified   Patient Goals and CMS Choice        Expected Discharge Plan and Services Expected Discharge Plan: [facility]rections Facility   Discharge Planning Services: CM Consult   Living arrangements for the past 2 months: Apartment                          Prior Living Arrangements/Services Living arrangements for the past 2 months: Apartment Lives with:: Self                Criminal Activity/Legal Involvement Pertinent to Current Situation/Hospitalization: Yes - Comment as needed(patient is currently in police custody)  Activities of Daily Living Home Assistive Devices/Equipment: None ADL Screening (condition at time of admission) Patient's cognitive ability adequate to safely complete daily activities?: Yes Is the patient deaf or have difficulty hearing?: No Does the patient have difficulty seeing, even when wearing glasses/contacts?: No Does the patient have difficulty concentrating, remembering, or making decisions?: No Patient able to express need for assistance with ADLs?: Yes Does the patient have difficulty dressing or bathing?: No Independently performs ADLs?: Yes (appropriate for  developmental age) Does the patient have difficulty walking or climbing stairs?: Yes(RLE fx) Weakness of Legs: Right Weakness of Arms/Hands: None  Permission Sought/Granted                  Emotional Assessment       Orientation: : Oriented to Self, Oriented to Situation, Oriented to Place, Oriented to  Time Alcohol / Substance Use: Not Applicable    Admission diagnosis:  knee pain swelling Patient Active Problem List   Diagnosis Date Noted  . Tibial plateau fracture 08/31/2018  . Asthma attack 04/11/2014  . Asthma exacerbation 02/16/2014   PCP:  Patient, No Pcp Per Pharmacy:   [facility] [place] [place] [address] [place] [address] Phone: [phone] Fax: [phone]     Social Determinants of Health (SDOH) Interventions    Readmission Risk Interventions No flowsheet data found.

## 2018-09-02 NOTE — Progress Notes (Signed)
Orthopaedic Trauma Service (OTS)  1 Day Post-Op Procedure(s) (LRB): EXTERNAL FIXATION LEG (Right) Fine Needle Aspiration (Right)  Subjective: Patient reports pain as mild.  No tingling or numbness. CT completed. Explained that law enforcement would not allow me to call the patient's mother yesterday at surgery.  Objective: Current Vitals Blood pressure 132/83, pulse (!) 59, temperature 98.4 F (36.9 C), temperature source Oral, resp. rate 16, height 5\' 8"  (1.727 m), weight 83.9 kg, SpO2 100 %. Vital signs in last 24 hours: Temp:  [97.3 F (36.3 C)-98.6 F (37 C)] 98.4 F (36.9 C) (04/15 0518) Pulse Rate:  [59-106] 59 (04/15 0518) Resp:  [12-18] 16 (04/15 0518) BP: (115-145)/(57-99) 132/83 (04/15 0518) SpO2:  [99 %-100 %] 100 % (04/15 0518) Weight:  [83.9 kg] 83.9 kg (04/14 1303)  Intake/Output from previous day: 04/14 0701 - 04/15 0700 In: 1893.9 [P.O.:600; I.V.:1168.9; IV Piggyback:50] Out: 2000 [Urine:2000]  LABS Recent Labs    08/31/18 2115 09/01/18 1125 09/02/18 0148  HGB 15.3 14.8 13.3   Recent Labs    09/01/18 1125 09/02/18 0148  WBC 12.7* 12.0*  RBC 5.14 4.59  HCT 45.1 40.1  PLT 207 189   Recent Labs    09/01/18 1125 09/02/18 0148  NA 141 139  K 4.2 4.3  CL 106 109  CO2 27 23  BUN 7 7  CREATININE 1.44* 1.18  GLUCOSE 102* 121*  CALCIUM 8.8* 8.4*   No results for input(s): LABPT, INR in the last 72 hours.   Physical Exam RLE  Dressing intact, clean, dry  Edema/ swelling controlled  Sens: DPN, SPN, TN intact  Motor: EHL, FHL, and lessor toe ext and flex all intact grossly  Brisk cap refill, warm to touch, 2+  No pain with passive stretch  Assessment/Plan: 1 Day Post-Op Procedure(s) (LRB): EXTERNAL FIXATION LEG (Right) Fine Needle Aspiration (Right) 1. PT/OT today but bedrest and elevation with ice otherwise 2. DVT proph Lovenox 3. Will check soft tissue condition tomorrow with ORIF on Friday if feasible  Myrene Galas,  MD [facility] [phone] (p)

## 2018-09-02 NOTE — Evaluation (Signed)
Occupational Therapy Evaluation Patient Details Name: Jacob Ho MRN: [id number] DOB: 1975-07-03 Today's Date: 09/02/2018    History of Present Illness Pt is a 43 y.o. male admitted 08/31/18 after sustaining R tibial plateau fx after [facility] from [facility]. Now s/p R lower leg external fixation 4/14. May return to OR on 09/04/18 for ORIF of right tibial plateau fx. PMH includes asthma.   Clinical Impression   Pt admitted with R tibial plateau fracture. Pt currently with functional limitations due to the deficits listed below (see OT Problem List).  Pt will benefit from skilled OT to increase their safety and independence with ADL and functional mobility for ADL to facilitate discharge to venue listed below.   Per CM pt will be able to take crutches to jail as he is NWB     Follow Up Recommendations  Follow surgeon's recommendation for DC plan and follow-up therapies    Equipment Recommendations  None recommended by OT    Recommendations for Other Services       Precautions / Restrictions Restrictions Weight Bearing Restrictions: Yes RLE Weight Bearing: Non weight bearing      Mobility Bed Mobility Overal bed mobility: Needs Assistance Bed Mobility: Supine to Sit     Supine to sit: Min assist        Transfers Overall transfer level: Needs assistance Equipment used: Crutches Transfers: Sit to/from [employer] Sit to Stand: Mod assist;Min assist;+2 safety/equipment Stand pivot transfers: Mod assist;Min assist;+2 safety/equipment       General transfer comment: Educated on use of crutches. Pt very unsteady with significant pain in standing.      Balance Overall balance assessment: Needs assistance Sitting-balance support: Feet supported Sitting balance-Leahy Scale: Good     Standing balance support: Bilateral upper extremity supported Standing balance-Leahy Scale: Poor                             ADL  either performed or assessed with clinical judgement   ADL Overall ADL's : Needs assistance/impaired Eating/Feeding: Set up;Sitting   Grooming: Set up;Sitting   Upper Body Bathing: Set up;Sitting   Lower Body Bathing: Moderate assistance;Sit to/from stand;Cueing for sequencing;Cueing for safety   Upper Body Dressing : Set up;Sitting   Lower Body Dressing: Moderate assistance;Sit to/from stand;Cueing for sequencing;Cueing for safety                 General ADL Comments: Educated pt and CNA for pt to use BSC for BM as pt not able to yet walk to the bathroom.       Vision Patient Visual Report: No change from baseline       Perception     Praxis      Pertinent Vitals/Pain Pain Assessment: 0-10 Pain Score: 10-Worst pain ever Pain Location: when standing Pain Descriptors / Indicators: Sharp Pain Intervention(s): Limited activity within patient's tolerance;Repositioned;Monitored during session     Hand Dominance     Extremity/Trunk Assessment Upper Extremity Assessment Upper Extremity Assessment: Overall WFL for tasks assessed           Communication     Cognition Arousal/Alertness: Awake/alert Behavior During Therapy: WFL for tasks assessed/performed Overall Cognitive Status: Within Functional Limits for tasks assessed  Home Living Family/patient expects to be discharged to:: Dentention/Prison                                                 OT Problem List: Pain;Decreased knowledge of precautions;Decreased activity tolerance      OT Treatment/Interventions: Self-care/ADL training;Patient/family education;DME and/or AE instruction;Therapeutic activities    OT Goals(Current goals can be found in the care plan section) Acute Rehab OT Goals Patient Stated Goal:  less pain OT Goal Formulation: With patient Time For Goal Achievement: 09/09/18 ADL Goals Pt Will Perform  Lower Body Dressing: with supervision;sit to/from stand Pt Will Transfer to Toilet: with supervision;ambulating Pt Will Perform Toileting - Clothing Manipulation and hygiene: with supervision;sit to/from stand  OT Frequency: Min 2X/week           Co-evaluation   Reason for Co-Treatment: For patient/therapist safety;To address functional/ADL transfers PT goals addressed during session: Mobility/safety with mobility;Balance;Proper use of DME        AM-PAC OT "6 Clicks" Daily Activity     Outcome Measure Help from another person eating meals?: None Help from another person taking care of personal grooming?: None Help from another person toileting, which includes using toliet, bedpan, or urinal?: A Little   Help from another person to put on and taking off regular upper body clothing?: A Little Help from another person to put on and taking off regular lower body clothing?: A Lot 6 Click Score: 16   End of Session Nurse Communication: [facility])  Activity Tolerance: Patient limited by pain Patient left: in chair;with call bell/phone within reach;Other (comment)(sheriff in room)  OT Visit Diagnosis: Unsteadiness on feet (R26.81);Pain                Time: [id number] OT Time Calculation (min): 21 min Charges:  OT General Charges $OT Visit: 1 Visit OT Evaluation $OT Eval Low Complexity: 1 Low  Lise AuerLori Rickita Forstner, OT Acute Rehabilitation Services [phone] Office- [phone]     Louiza Moor, Karin GoldenLorraine D 09/02/2018, 3:58 PM

## 2018-09-03 MED ORDER — VITAMIN D 25 MCG (1000 UNIT) PO TABS
2000.0000 [IU] | ORAL_TABLET | Freq: Two times a day (BID) | ORAL | Status: DC
  Administered 2018-09-03 – 2018-09-08 (×10): 2000 [IU] via ORAL
  Filled 2018-09-03 (×10): qty 2

## 2018-09-03 MED ORDER — CEFAZOLIN SODIUM-DEXTROSE 2-4 GM/100ML-% IV SOLN
2.0000 g | INTRAVENOUS | Status: AC
  Administered 2018-09-04: 2 g via INTRAVENOUS
  Filled 2018-09-03 (×2): qty 100

## 2018-09-03 NOTE — Progress Notes (Signed)
Orthopedic Trauma Service Progress Note  Patient ID: Jacob Ho MRN: [id number] DOB/AGE: Jun 19, 1975 43 y.o.  Subjective:  Doing well No acute issues Good mood this am    ROS As above  Objective:   VITALS:   Vitals:   09/02/18 2003 09/02/18 2102 09/03/18 0351 09/03/18 0837  BP: 139/78  119/80   Pulse: 82  65   Resp: 17  15   Temp: 99.5 F (37.5 C)  98.7 F (37.1 C)   TempSrc: Oral  Oral   SpO2: 100% 98% 100% 98%  Weight:      Height:        Estimated body mass index is 28.13 kg/m as calculated from the following:   Height as of this encounter: 5\' 8"  (1.727 m).   Weight as of this encounter: 83.9 kg.   Intake/Output      04/15 0701 - 04/16 0700 04/16 0701 - 04/17 0700   P.O. 880    I.V. (mL/kg) 969.3 (11.6)    Other     IV Piggyback 50    Total Intake(mL/kg) 1899.3 (22.6)    Urine (mL/kg/hr) 1500 (0.7)    Total Output 1500    Net +399.3         Urine Occurrence  1 x     LABS  Results for Jacob Ho (MRN [id number]) as of 09/03/2018 11:22  Ref. Range 09/01/2018 11:25  Vit D, 1,25-Dihydroxy Latest Ref Range: 19.9 - 79.3 pg/mL 37.5  Vitamin D, 25-Hydroxy Latest Ref Range: 30.0 - 100.0 ng/mL 8.3 (L)   PHYSICAL EXAM:   Gen: awake and alert, sitting up in chair  Lungs: clear today, good movement Cardiac: regular Abd: + BS, NTND Ext:       Right Lower Extremity   Ex fix stable  pinsites look good  Swelling resolving nicely   Skin wrinkles with gentle compression   Mild decrease in TN sensation at great toe  DPN, SPN sensation intact  EHL, FHL, lesser toe motor intact  Ankle flexion, inversion, eversion, inversion intact  + DP pulse  No pain with passive stretch   Assessment/Plan: 2 Days Post-Op   Principal Problem:   Tibial plateau fracture   Anti-infectives (From admission, onward)   Start     Dose/Rate Route Frequency Ordered Stop   09/04/18 0600   ceFAZolin (ANCEF) IVPB 2g/100 mL premix     2 g 200 mL/hr over 30 Minutes Intravenous On call to O.R. 09/03/18 1016 09/05/18 0559   09/01/18 1900  ceFAZolin (ANCEF) IVPB 1 g/50 mL premix     1 g 100 mL/hr over 30 Minutes Intravenous Every 6 hours 09/01/18 1640 09/02/18 1259   09/01/18 0800  ceFAZolin (ANCEF) IVPB 2g/100 mL premix     2 g 200 mL/hr over 30 Minutes Intravenous On call to O.R. 08/31/18 2314 09/01/18 1452    .  POD/HD#: 2  43 y/o male s/p fall from height with R tibial plateau fracture   - complex R tibial plateau fracture  OR tomorrow for ORIF and ex fix removal  NWB x 8 weeks  ROM as tolerated post op   Ok to work with PT/OT  Continue with aggressive ice and elevation   - Pain management:  Titrate accordingly   - ABL anemia/Hemodynamics  Stable  - Medical issues  Acute renal injury   Resolved   Likely dehydration mediated  Acute leukocytosis   Resolved   Stress mediated    Asthma   Continue with home meds   No wheezing noted today    HgbA1c   Normal    Nicotine dependence   No nicotine products of any kind due to negative effects on bone and wound healing      - DVT/PE prophylaxis:  lovenox   Recommend lovenox x 4 weeks post op  - ID:   periop abx   - Metabolic Bone Disease:  + vitamin d deficiency   Supplement   - Activity:  NWB R leg  - FEN/GI prophylaxis/Foley/Lines:  NPO after MN  Continue with IVF  protonix   -Ex-fix/Splint care:  Ok to manipulate leg by ex fix   - Impediments to fracture healing:  Nicotine dependence   - Dispo:  OR tomorrow for ORIF R tibial plateau      Jacob LatinKeith W. Doni Bacha, PA-C [phone] (C) 09/03/2018, 11:13 AM  [facility] [address] [place] [address] [phone] Jacob Ho(O) [phone] (F)

## 2018-09-03 NOTE — Progress Notes (Signed)
Physical Therapy Treatment Patient Details Name: Jacob BobMichael Ho MRN: [id number] DOB: 09/07/1975 Today's Date: 09/03/2018    History of Present Illness Pt is a 43 y.o. male admitted 08/31/18 after sustaining R tibial [place] fx [place]. Now s/p R lower leg external fixation 4/14. May return to OR on 09/04/18 for ORIF of right tibial plateau fx. PMH includes asthma.    PT Comments    Pt initially agitated on arrival.  Pt required explanation and importance of mobility during hospitalization.  Pt progressing well but remains hesitant to use crutches at this time.  Will progress to crutch use next visit.     Follow Up Recommendations  Follow surgeon's recommendation for DC plan and follow-up therapies     Equipment Recommendations  Crutches(for 5\' 9" )    Recommendations for Other Services       Precautions / Restrictions Precautions Precautions: Fall Restrictions Weight Bearing Restrictions: Yes RLE Weight Bearing: Non weight bearing    Mobility  Bed Mobility Overal bed mobility: Needs Assistance Bed Mobility: Supine to Sit     Supine to sit: Supervision     General bed mobility comments: Pt performed  with self assisting RLE to edge of bed.  Increased time and effort noted.    Transfers Overall transfer level: Needs assistance Equipment used: Rolling walker (2 wheeled) Transfers: Sit to/from Stand Sit to Stand: Supervision         General transfer comment: Poor hand placement grabbing to RW and pushing through device.  NO LOB and able to maintain weight bearing.    Ambulation/Gait Ambulation/Gait assistance: Min assist Gait Distance (Feet): 80 Feet Assistive device: Rolling walker (2 wheeled) Gait Pattern/deviations: Step-to pattern;Trunk flexed(hop to) Gait velocity: Decreased   General Gait Details: Cues for upper trunk control.  When patient fatigues he foot clearance decreases.  RW appears to be too short but  refused to change height during gait training.     Stairs             Wheelchair Mobility    Modified Rankin (Stroke Patients Only)       Balance     Sitting balance-Leahy Scale: Good       Standing balance-Leahy Scale: Poor                              Cognition Arousal/Alertness: Awake/alert Behavior During Therapy: WFL for tasks assessed/performed Overall Cognitive Status: Within Functional Limits for tasks assessed                                        Exercises      General Comments        Pertinent Vitals/Pain Pain Assessment: 0-10 Pain Score: 9  Pain Location: RLE when standing Pain Descriptors / Indicators: Sharp;Grimacing;Moaning Pain Intervention(s): Monitored during session;Repositioned;Ice applied    Home Living                      Prior Function            PT Goals (current goals can now be found in the care plan section) Acute Rehab PT Goals Patient Stated Goal:  less pain Potential to Achieve Goals: Good Progress towards PT goals: Progressing toward goals    Frequency    Min 5X/week  PT Plan Current plan remains appropriate    Co-evaluation              AM-PAC PT "6 Clicks" Mobility   Outcome Measure  Help needed turning from your back to your side while in a flat bed without using bedrails?: None Help needed moving from lying on your back to sitting on the side of a flat bed without using bedrails?: A Little Help needed moving to and from a bed to a chair (including a wheelchair)?: A Little Help needed standing up from a chair using your arms (e.g., wheelchair or bedside chair)?: A Little Help needed to walk in hospital room?: A Little Help needed climbing 3-5 steps with a railing? : A Little 6 Click Score: 19    End of Session Equipment Utilized During Treatment: Gait belt Activity Tolerance: Patient limited by pain Patient left: with call bell/phone within  reach;Other (comment);in bed(with police escort sitting in room to monitor patient) Nurse Communication: Mobility status PT Visit Diagnosis: Other abnormalities of gait and mobility (R26.89)     Time: 1191-4782 PT Time Calculation (min) (ACUTE ONLY): 20 min  Charges:  $Gait Training: 8-22 mins                     Joycelyn Rua, PTA Acute Rehabilitation Services Pager [phone] Office [phone]     Jacob Ho Artis Delay 09/03/2018, 5:54 PM

## 2018-09-03 NOTE — Plan of Care (Signed)
  Problem: Education: Goal: Knowledge of General Education information will improve Description: Including pain rating scale, medication(s)/side effects and non-pharmacologic comfort measures Outcome: Progressing   Problem: Activity: Goal: Risk for activity intolerance will decrease Outcome: Progressing   Problem: Pain Managment: Goal: General experience of comfort will improve Outcome: Progressing   Problem: Safety: Goal: Ability to remain free from injury will improve Outcome: Progressing   Problem: Pain Management: Goal: Pain level will decrease with appropriate interventions Outcome: Progressing   

## 2018-09-04 ENCOUNTER — Encounter (HOSPITAL_COMMUNITY): Payer: Self-pay | Admitting: Anesthesiology

## 2018-09-04 ENCOUNTER — Inpatient Hospital Stay (HOSPITAL_COMMUNITY): Payer: Self-pay | Admitting: Anesthesiology

## 2018-09-04 ENCOUNTER — Encounter (HOSPITAL_COMMUNITY)
Admission: EM | Disposition: A | Discharge status: Home / Self Care | Payer: Self-pay | Source: Home / Self Care | Attending: Orthopedic Surgery

## 2018-09-04 ENCOUNTER — Inpatient Hospital Stay (HOSPITAL_COMMUNITY): Payer: Self-pay

## 2018-09-04 HISTORY — PX: ORIF TIBIA PLATEAU: SHX2132

## 2018-09-04 HISTORY — PX: EXTERNAL FIXATION REMOVAL: SHX5040

## 2018-09-04 LAB — OPIATES,MS,WB/SP RFX
6-Acetylmorphine: NEGATIVE
Codeine: NEGATIVE ng/mL
Dihydrocodeine: NEGATIVE ng/mL
Hydrocodone: NEGATIVE ng/mL
Hydromorphone: NEGATIVE ng/mL
Morphine: 1.3 ng/mL
Opiate Confirmation: POSITIVE

## 2018-09-04 LAB — BASIC METABOLIC PANEL
Anion gap: 5 (ref 5–15)
BUN: 12 mg/dL (ref 6–20)
CO2: 28 mmol/L (ref 22–32)
Calcium: 8.3 mg/dL — ABNORMAL LOW (ref 8.9–10.3)
Chloride: 107 mmol/L (ref 98–111)
Creatinine, Ser: 1.11 mg/dL (ref 0.61–1.24)
GFR calc Af Amer: >60 mL/min (ref >60)
GFR calc non Af Amer: >60 mL/min (ref >60)
Glucose, Bld: 94 mg/dL (ref 70–99)
Potassium: 3.8 mmol/L (ref 3.5–5.1)
Sodium: 140 mmol/L (ref 135–145)

## 2018-09-04 LAB — CBC
HCT: 36 % — ABNORMAL LOW (ref 39.0–52.0)
Hemoglobin: 11.8 g/dL — ABNORMAL LOW (ref 13.0–17.0)
MCH: 28.8 pg (ref 26.0–34.0)
MCHC: 32.8 g/dL (ref 30.0–36.0)
MCV: 87.8 fL (ref 80.0–100.0)
Platelets: 201 10*3/uL (ref 150–400)
RBC: 4.1 MIL/uL — ABNORMAL LOW (ref 4.22–5.81)
RDW: 13.7 % (ref 11.5–15.5)
WBC: 8.3 10*3/uL (ref 4.0–10.5)
nRBC: 0 % (ref 0.0–0.2)

## 2018-09-04 LAB — PROTIME-INR
INR: 1.1 (ref 0.8–1.2)
Prothrombin Time: 13.7 seconds (ref 11.4–15.2)

## 2018-09-04 SURGERY — OPEN REDUCTION INTERNAL FIXATION (ORIF) TIBIAL PLATEAU
Anesthesia: General | Site: Leg Lower | Laterality: Right

## 2018-09-04 MED ORDER — ACETAMINOPHEN 500 MG PO TABS
1000.0000 mg | ORAL_TABLET | Freq: Three times a day (TID) | ORAL | Status: DC
  Administered 2018-09-04 – 2018-09-05 (×2): 1000 mg via ORAL
  Filled 2018-09-04: qty 2

## 2018-09-04 MED ORDER — FENTANYL CITRATE (PF) 250 MCG/5ML IJ SOLN
INTRAMUSCULAR | Status: AC
  Filled 2018-09-04: qty 5

## 2018-09-04 MED ORDER — 0.9 % SODIUM CHLORIDE (POUR BTL) OPTIME
TOPICAL | Status: DC | PRN
  Administered 2018-09-04: 14:00:00 1000 mL

## 2018-09-04 MED ORDER — SUCCINYLCHOLINE CHLORIDE 200 MG/10ML IV SOSY
PREFILLED_SYRINGE | INTRAVENOUS | Status: DC | PRN
  Administered 2018-09-04: 160 mg via INTRAVENOUS

## 2018-09-04 MED ORDER — ALBUTEROL SULFATE HFA 108 (90 BASE) MCG/ACT IN AERS
INHALATION_SPRAY | RESPIRATORY_TRACT | Status: AC
  Filled 2018-09-04: qty 6.7

## 2018-09-04 MED ORDER — LACTATED RINGERS IV SOLN
INTRAVENOUS | Status: DC
  Administered 2018-09-04 (×2): via INTRAVENOUS

## 2018-09-04 MED ORDER — DEXMEDETOMIDINE HCL 200 MCG/2ML IV SOLN
INTRAVENOUS | Status: DC | PRN
  Administered 2018-09-04: 20 ug via INTRAVENOUS
  Administered 2018-09-04 (×2): 10 ug via INTRAVENOUS

## 2018-09-04 MED ORDER — SUCCINYLCHOLINE CHLORIDE 200 MG/10ML IV SOSY
PREFILLED_SYRINGE | INTRAVENOUS | Status: AC
  Filled 2018-09-04: qty 10

## 2018-09-04 MED ORDER — ALBUTEROL SULFATE HFA 108 (90 BASE) MCG/ACT IN AERS
INHALATION_SPRAY | RESPIRATORY_TRACT | Status: DC | PRN
  Administered 2018-09-04: 2 via RESPIRATORY_TRACT

## 2018-09-04 MED ORDER — PROPOFOL 10 MG/ML IV BOLUS
INTRAVENOUS | Status: DC | PRN
  Administered 2018-09-04: 200 mg via INTRAVENOUS

## 2018-09-04 MED ORDER — ROCURONIUM BROMIDE 50 MG/5ML IV SOSY
PREFILLED_SYRINGE | INTRAVENOUS | Status: DC | PRN
  Administered 2018-09-04 (×3): 10 mg via INTRAVENOUS
  Administered 2018-09-04: 50 mg via INTRAVENOUS

## 2018-09-04 MED ORDER — MIDAZOLAM HCL 5 MG/5ML IJ SOLN
INTRAMUSCULAR | Status: DC | PRN
  Administered 2018-09-04: 2 mg via INTRAVENOUS

## 2018-09-04 MED ORDER — HYDROMORPHONE HCL 1 MG/ML IJ SOLN
0.2500 mg | INTRAMUSCULAR | Status: DC | PRN
  Administered 2018-09-04 (×2): 0.5 mg via INTRAVENOUS

## 2018-09-04 MED ORDER — ROCURONIUM BROMIDE 50 MG/5ML IV SOSY
PREFILLED_SYRINGE | INTRAVENOUS | Status: AC
  Filled 2018-09-04: qty 5

## 2018-09-04 MED ORDER — CEFAZOLIN SODIUM-DEXTROSE 2-4 GM/100ML-% IV SOLN
2.0000 g | Freq: Three times a day (TID) | INTRAVENOUS | Status: AC
  Administered 2018-09-04 – 2018-09-05 (×3): 2 g via INTRAVENOUS
  Filled 2018-09-04 (×3): qty 100

## 2018-09-04 MED ORDER — SUGAMMADEX SODIUM 500 MG/5ML IV SOLN
INTRAVENOUS | Status: DC | PRN
  Administered 2018-09-04: 200 mg via INTRAVENOUS

## 2018-09-04 MED ORDER — DEXAMETHASONE SODIUM PHOSPHATE 10 MG/ML IJ SOLN
INTRAMUSCULAR | Status: AC
  Filled 2018-09-04: qty 1

## 2018-09-04 MED ORDER — OXYCODONE HCL 5 MG PO TABS
ORAL_TABLET | ORAL | Status: AC
  Filled 2018-09-04: qty 2

## 2018-09-04 MED ORDER — HYDROMORPHONE HCL 1 MG/ML IJ SOLN
INTRAMUSCULAR | Status: AC
  Filled 2018-09-04: qty 2

## 2018-09-04 MED ORDER — FENTANYL CITRATE (PF) 250 MCG/5ML IJ SOLN
INTRAMUSCULAR | Status: DC | PRN
  Administered 2018-09-04 (×2): 100 ug via INTRAVENOUS
  Administered 2018-09-04 (×5): 50 ug via INTRAVENOUS

## 2018-09-04 MED ORDER — ONDANSETRON HCL 4 MG/2ML IJ SOLN
INTRAMUSCULAR | Status: AC
  Filled 2018-09-04: qty 2

## 2018-09-04 MED ORDER — DEXAMETHASONE SODIUM PHOSPHATE 10 MG/ML IJ SOLN
INTRAMUSCULAR | Status: DC | PRN
  Administered 2018-09-04: 10 mg via INTRAVENOUS

## 2018-09-04 MED ORDER — ONDANSETRON HCL 4 MG/2ML IJ SOLN
INTRAMUSCULAR | Status: DC | PRN
  Administered 2018-09-04: 4 mg via INTRAVENOUS

## 2018-09-04 MED ORDER — MIDAZOLAM HCL 2 MG/2ML IJ SOLN
INTRAMUSCULAR | Status: AC
  Filled 2018-09-04: qty 2

## 2018-09-04 MED ORDER — LIDOCAINE 2% (20 MG/ML) 5 ML SYRINGE
INTRAMUSCULAR | Status: DC | PRN
  Administered 2018-09-04: 100 mg via INTRAVENOUS

## 2018-09-04 MED ORDER — ROCURONIUM BROMIDE 50 MG/5ML IV SOSY: PREFILLED_SYRINGE | INTRAVENOUS | Status: DC | PRN

## 2018-09-04 MED ORDER — PROPOFOL 10 MG/ML IV BOLUS
INTRAVENOUS | Status: AC
  Filled 2018-09-04: qty 20

## 2018-09-04 MED ORDER — DEXMEDETOMIDINE HCL IN NACL 200 MCG/50ML IV SOLN
INTRAVENOUS | Status: AC
  Filled 2018-09-04: qty 50

## 2018-09-04 MED ORDER — LIDOCAINE 2% (20 MG/ML) 5 ML SYRINGE
INTRAMUSCULAR | Status: AC
  Filled 2018-09-04: qty 5

## 2018-09-04 SURGICAL SUPPLY — 85 items
BANDAGE ACE 4X5 VEL STRL LF (GAUZE/BANDAGES/DRESSINGS) ×3 IMPLANT
BANDAGE ACE 6X5 VEL STRL LF (GAUZE/BANDAGES/DRESSINGS) ×3 IMPLANT
BANDAGE ELASTIC 4 VELCRO ST LF (GAUZE/BANDAGES/DRESSINGS) ×3 IMPLANT
BANDAGE ESMARK 6X9 LF (GAUZE/BANDAGES/DRESSINGS) ×1 IMPLANT
BIT DRILL 2.5 X LONG (BIT) ×1
BIT DRILL PERC QC 2.8X200 100 (BIT) ×1 IMPLANT
BIT DRILL X LONG 2.5 (BIT) ×1 IMPLANT
BLADE SURG 10 STRL SS (BLADE) ×3 IMPLANT
BLADE SURG 15 STRL LF DISP TIS (BLADE) ×1 IMPLANT
BLADE SURG 15 STRL SS (BLADE) ×2
BNDG COHESIVE 4X5 TAN STRL (GAUZE/BANDAGES/DRESSINGS) ×6 IMPLANT
BNDG ESMARK 6X9 LF (GAUZE/BANDAGES/DRESSINGS) ×3
BNDG GAUZE ELAST 4 BULKY (GAUZE/BANDAGES/DRESSINGS) ×6 IMPLANT
BRUSH SCRUB SURG 4.25 DISP (MISCELLANEOUS) ×3 IMPLANT
CANISTER SUCT 3000ML PPV (MISCELLANEOUS) ×3 IMPLANT
COVER SURGICAL LIGHT HANDLE (MISCELLANEOUS) ×6 IMPLANT
COVER WAND RF STERILE (DRAPES) ×3 IMPLANT
CUFF TOURNIQUET SINGLE 34IN LL (TOURNIQUET CUFF) ×3 IMPLANT
DRAPE C-ARM 42X72 X-RAY (DRAPES) ×3 IMPLANT
DRAPE C-ARMOR (DRAPES) ×3 IMPLANT
DRAPE HALF SHEET 40X57 (DRAPES) ×2 IMPLANT
DRAPE INCISE IOBAN 66X45 STRL (DRAPES) ×3 IMPLANT
DRAPE U-SHAPE 47X51 STRL (DRAPES) ×3 IMPLANT
DRILL BIT QUICK COUP 2.8MM 100 (BIT) ×2
DRILL BIT X LONG 2.5 (BIT) ×2
DRSG ADAPTIC 3X8 NADH LF (GAUZE/BANDAGES/DRESSINGS) ×3 IMPLANT
DRSG MEPILEX BORDER 4X4 (GAUZE/BANDAGES/DRESSINGS) ×6 IMPLANT
DRSG PAD ABDOMINAL 8X10 ST (GAUZE/BANDAGES/DRESSINGS) ×6 IMPLANT
ELECT REM PT RETURN 9FT ADLT (ELECTROSURGICAL) ×3
ELECTRODE REM PT RTRN 9FT ADLT (ELECTROSURGICAL) ×1 IMPLANT
GAUZE SPONGE 4X4 12PLY STRL (GAUZE/BANDAGES/DRESSINGS) ×3 IMPLANT
GLOVE BIO SURGEON STRL SZ7.5 (GLOVE) ×6 IMPLANT
GLOVE BIO SURGEON STRL SZ8 (GLOVE) ×6 IMPLANT
GLOVE BIOGEL PI IND STRL 7.5 (GLOVE) ×1 IMPLANT
GLOVE BIOGEL PI IND STRL 8 (GLOVE) ×1 IMPLANT
GLOVE BIOGEL PI INDICATOR 7.5 (GLOVE) ×2
GLOVE BIOGEL PI INDICATOR 8 (GLOVE) ×2
GOWN STRL REUS W/ TWL LRG LVL3 (GOWN DISPOSABLE) ×3 IMPLANT
GOWN STRL REUS W/ TWL XL LVL3 (GOWN DISPOSABLE) ×1 IMPLANT
GOWN STRL REUS W/TWL LRG LVL3 (GOWN DISPOSABLE) ×6
GOWN STRL REUS W/TWL XL LVL3 (GOWN DISPOSABLE) ×2
IMMOBILIZER KNEE 22 UNIV (SOFTGOODS) ×3 IMPLANT
KIT BASIN OR (CUSTOM PROCEDURE TRAY) ×3 IMPLANT
KIT TURNOVER KIT B (KITS) ×3 IMPLANT
MANIFOLD NEPTUNE II (INSTRUMENTS) ×3 IMPLANT
NDL SUT 6 .5 CRC .975X.05 MAYO (NEEDLE) IMPLANT
NEEDLE MAYO TAPER (NEEDLE)
NS IRRIG 1000ML POUR BTL (IV SOLUTION) ×3 IMPLANT
PACK ORTHO EXTREMITY (CUSTOM PROCEDURE TRAY) ×3 IMPLANT
PAD ARMBOARD 7.5X6 YLW CONV (MISCELLANEOUS) ×6 IMPLANT
PAD CAST 4YDX4 CTTN HI CHSV (CAST SUPPLIES) IMPLANT
PADDING CAST COTTON 4X4 STRL (CAST SUPPLIES)
PADDING CAST COTTON 6X4 STRL (CAST SUPPLIES) IMPLANT
PLATE 4H RT 3.5MM MED PROX TIB (Plate) ×3 IMPLANT
PLATE DIST TIBIA 3.5MM 5H 90MM (Plate) ×3 IMPLANT
SCREW HEADED ST 3.5X34 (Screw) ×3 IMPLANT
SCREW HEADED ST 3.5X40 (Screw) ×3 IMPLANT
SCREW HEADED ST 3.5X42 (Screw) ×6 IMPLANT
SCREW HEADED ST 3.5X56 (Screw) ×3 IMPLANT
SCREW HEADED ST 3.5X90 (Screw) ×3 IMPLANT
SCREW LOCK T15 FT 55X3.5X2.9X (Screw) ×2 IMPLANT
SCREW LOCK T15 FT 60X3.5X2.9X (Screw) ×2 IMPLANT
SCREW LOCKING 3.5X50 (Screw) ×3 IMPLANT
SCREW LOCKING 3.5X55 (Screw) ×4 IMPLANT
SCREW LOCKING 3.5X60 (Screw) ×4 IMPLANT
SCREW LOCKING SLF TAP 3.5X70MM (Screw) ×6 IMPLANT
SPONGE LAP 18X18 RF (DISPOSABLE) ×3 IMPLANT
STAPLER VISISTAT 35W (STAPLE) ×3 IMPLANT
STOCKINETTE IMPERVIOUS LG (DRAPES) ×3 IMPLANT
SUCTION FRAZIER HANDLE 10FR (MISCELLANEOUS) ×2
SUCTION TUBE FRAZIER 10FR DISP (MISCELLANEOUS) ×1 IMPLANT
SUT ETHILON 2 0 PSLX (SUTURE) ×3 IMPLANT
SUT ETHILON 3 0 PS 1 (SUTURE) IMPLANT
SUT PROLENE 0 CT 2 (SUTURE) ×6 IMPLANT
SUT VIC AB 0 CT1 27 (SUTURE) ×2
SUT VIC AB 0 CT1 27XBRD ANBCTR (SUTURE) ×1 IMPLANT
SUT VIC AB 1 CT1 27 (SUTURE) ×2
SUT VIC AB 1 CT1 27XBRD ANBCTR (SUTURE) ×1 IMPLANT
SUT VIC AB 2-0 CT1 27 (SUTURE) ×4
SUT VIC AB 2-0 CT1 TAPERPNT 27 (SUTURE) ×2 IMPLANT
TUBE CONNECTING 12'X1/4 (SUCTIONS) ×1
TUBE CONNECTING 12X1/4 (SUCTIONS) ×2 IMPLANT
UNDERPAD 30X30 (UNDERPADS AND DIAPERS) ×3 IMPLANT
WATER STERILE IRR 1000ML POUR (IV SOLUTION) ×6 IMPLANT
YANKAUER SUCT BULB TIP NO VENT (SUCTIONS) ×3 IMPLANT

## 2018-09-04 NOTE — Plan of Care (Signed)

## 2018-09-04 NOTE — Anesthesia Procedure Notes (Signed)
Procedure Name: Intubation Date/Time: 09/04/2018 2:07 PM Performed by: Adria Dill, CRNA Pre-anesthesia Checklist: Patient identified, Emergency Drugs available, Suction available and Patient being monitored Patient Re-evaluated:Patient Re-evaluated prior to induction Oxygen Delivery Method: Circle system utilized Preoxygenation: Pre-oxygenation with 100% oxygen Induction Type: IV induction and Rapid sequence Laryngoscope Size: Miller and 3 Grade View: Grade I Tube type: Oral Tube size: 7.5 mm Number of attempts: 1 Airway Equipment and Method: Stylet and Oral airway Placement Confirmation: ETT inserted through vocal cords under direct vision,  positive ETCO2 and breath sounds checked- equal and bilateral Secured at: 21 cm Tube secured with: Tape Dental Injury: Teeth and Oropharynx as per pre-operative assessment

## 2018-09-04 NOTE — Progress Notes (Signed)
I discussed with the patient the risks and benefits of surgery for his right knee, including the possibility of infection, nerve injury, vessel injury, wound breakdown, arthritis, symptomatic hardware, DVT/ PE, loss of motion, malunion, nonunion, and need for further surgery among others.  We also specifically discussed the elevated risk of soft tissue breakdown that could lead to amputation.  He acknowledged these risks and wished to proceed.  Myrene Galas, MD [facility], [facility] [phone]

## 2018-09-04 NOTE — Progress Notes (Signed)
OT Cancellation Note  Patient Details Name: Jacob Ho MRN: [id number] DOB: 08-17-1975   Cancelled Treatment:    Reason Eval/Treat Not Completed: Patient at procedure or test/ unavailable(surgery for ORIF)  Mateo Flow 09/04/2018, 2:08 PM   Mateo Flow, OTR/L  Acute Rehabilitation Services Pager: [phone] Office: [phone] .

## 2018-09-04 NOTE — Progress Notes (Signed)
Report called to nurse in short stay. Signed consents on pt chart. Pt wearing mask. Transport arrived to take to pre-surgical area.

## 2018-09-04 NOTE — Progress Notes (Signed)
PT Cancellation Note  Patient Details Name: Jacob Ho MRN: [id number] DOB: 08/12/1975   Cancelled Treatment:    Reason Eval/Treat Not Completed: (P) Patient at procedure or test/unavailable(removal of ex fix, will f/u per POC.  )   [name] Artis Delay 09/04/2018, 2:25 PM Joycelyn Rua, PTA Acute Rehabilitation Services Pager [phone] Office [phone]

## 2018-09-04 NOTE — Progress Notes (Signed)
Pt returned to room 6N17 via bed after surgery. Received report from Shipman, RN in PACU. Will continue to monitor.

## 2018-09-04 NOTE — Transfer of Care (Signed)
Immediate Anesthesia Transfer of Care Note  Patient: Jacob Ho  Procedure(s) Performed: OPEN REDUCTION INTERNAL FIXATION (ORIF) TIBIAL PLATEAU (Right Leg Lower) REMOVAL EXTERNAL FIXATION LEG (Right Leg Lower)  Patient Location: PACU  Anesthesia Type:General  Level of Consciousness: awake, alert  and oriented  Airway & Oxygen Therapy: Patient Spontanous Breathing and Patient connected to nasal cannula oxygen  Post-op Assessment: Report given to RN and Post -op Vital signs reviewed and stable  Post vital signs: Reviewed and stable  Last Vitals:  Vitals Value Taken Time  BP 160/93 09/04/2018  5:18 PM  Temp    Pulse 102 09/04/2018  5:19 PM  Resp 31 09/04/2018  5:19 PM  SpO2 100 % 09/04/2018  5:19 PM  Vitals shown include unvalidated device data.  Last Pain:  Vitals:   09/04/18 1236  TempSrc: Oral  PainSc:       Patients Stated Pain Goal: 2 (09/04/18 1031)  Complications: No apparent anesthesia complications

## 2018-09-04 NOTE — Anesthesia Preprocedure Evaluation (Signed)
Anesthesia Evaluation  Patient identified by MRN, date of birth, ID band Patient awake    Reviewed: Allergy & Precautions, H&P , NPO status , Patient's Chart, lab work & pertinent test results  Airway Mallampati: II  TM Distance: >3 FB Neck ROM: Full    Dental no notable dental hx.    Pulmonary asthma , former smoker,    Pulmonary exam normal breath sounds clear to auscultation       Cardiovascular negative cardio ROS Normal cardiovascular exam Rhythm:Regular Rate:Normal     Neuro/Psych negative neurological ROS  negative psych ROS   GI/Hepatic negative GI ROS, Neg liver ROS,   Endo/Other  negative endocrine ROS  Renal/GU negative Renal ROS  negative genitourinary   Musculoskeletal negative musculoskeletal ROS (+)   Abdominal   Peds negative pediatric ROS (+)  Hematology negative hematology ROS (+)   Anesthesia Other Findings   Reproductive/Obstetrics negative OB ROS                             Anesthesia Physical  Anesthesia Plan  ASA: II  Anesthesia Plan: General   Post-op Pain Management:    Induction: Intravenous  PONV Risk Score and Plan: 2 and Ondansetron, Dexamethasone and Treatment may vary due to age or medical condition  Airway Management Planned: Oral ETT  Additional Equipment:   Intra-op Plan:   Post-operative Plan: Extubation in OR  Informed Consent: I have reviewed the patients History and Physical, chart, labs and discussed the procedure including the risks, benefits and alternatives for the proposed anesthesia with the patient or authorized representative who has indicated his/her understanding and acceptance.     Dental advisory given  Plan Discussed with: CRNA, Surgeon and Anesthesiologist  Anesthesia Plan Comments:         Anesthesia Quick Evaluation

## 2018-09-04 NOTE — Addendum Note (Signed)
Addendum  created 09/04/18 1744 by Dorris Singh, MD   Order list changed, Order sets accessed

## 2018-09-04 NOTE — Anesthesia Postprocedure Evaluation (Signed)
Anesthesia Post Note  Patient: Jacob Ho  Procedure(s) Performed: OPEN REDUCTION INTERNAL FIXATION (ORIF) TIBIAL PLATEAU (Right Leg Lower) REMOVAL EXTERNAL FIXATION LEG (Right Leg Lower)     Patient location during evaluation: PACU Anesthesia Type: General Level of consciousness: awake Pain management: pain level controlled Vital Signs Assessment: post-procedure vital signs reviewed and stable Respiratory status: spontaneous breathing Cardiovascular status: stable Postop Assessment: no apparent nausea or vomiting Anesthetic complications: no    Last Vitals:  Vitals:   09/04/18 1720 09/04/18 1725  BP: (!) 160/93   Pulse: 99 (!) 101  Resp: 12 14  Temp: 36.5 C   SpO2: 100% 98%    Last Pain:  Vitals:   09/04/18 1720  TempSrc:   PainSc: 0-No pain                 Brandonn Capelli

## 2018-09-05 ENCOUNTER — Inpatient Hospital Stay (HOSPITAL_COMMUNITY): Payer: Self-pay

## 2018-09-05 LAB — CBC
HCT: 37 % — ABNORMAL LOW (ref 39.0–52.0)
Hemoglobin: 12.2 g/dL — ABNORMAL LOW (ref 13.0–17.0)
MCH: 28.8 pg (ref 26.0–34.0)
MCHC: 33 g/dL (ref 30.0–36.0)
MCV: 87.3 fL (ref 80.0–100.0)
Platelets: 252 10*3/uL (ref 150–400)
RBC: 4.24 MIL/uL (ref 4.22–5.81)
RDW: 13.6 % (ref 11.5–15.5)
WBC: 11 10*3/uL — ABNORMAL HIGH (ref 4.0–10.5)
nRBC: 0 % (ref 0.0–0.2)

## 2018-09-05 MED ORDER — HYDROCODONE-ACETAMINOPHEN 10-325 MG PO TABS
1.0000 | ORAL_TABLET | Freq: Four times a day (QID) | ORAL | Status: DC | PRN
  Administered 2018-09-05 (×2): 1 via ORAL
  Administered 2018-09-06 (×2): 2 via ORAL
  Administered 2018-09-06: 1 via ORAL
  Administered 2018-09-07 – 2018-09-08 (×5): 2 via ORAL
  Filled 2018-09-05 (×5): qty 2
  Filled 2018-09-05: qty 1
  Filled 2018-09-05: qty 2
  Filled 2018-09-05: qty 1
  Filled 2018-09-05: qty 2
  Filled 2018-09-05: qty 1
  Filled 2018-09-05: qty 2

## 2018-09-05 MED ORDER — HYDROMORPHONE HCL 1 MG/ML IJ SOLN
0.5000 mg | INTRAMUSCULAR | Status: DC | PRN
  Administered 2018-09-05 – 2018-09-08 (×18): 1 mg via INTRAVENOUS
  Filled 2018-09-05 (×18): qty 1

## 2018-09-05 MED ORDER — KETOROLAC TROMETHAMINE 30 MG/ML IJ SOLN
30.0000 mg | Freq: Three times a day (TID) | INTRAMUSCULAR | Status: AC
  Administered 2018-09-05 – 2018-09-08 (×7): 30 mg via INTRAVENOUS
  Filled 2018-09-05 (×8): qty 1

## 2018-09-05 NOTE — Progress Notes (Signed)
Orthopedic Trauma Service Progress Note  Patient ID: Jacob Ho MRN: [id number] DOB/AGE: 45-Oct-1977 45 y.o.  Subjective:  In a moderate amount of pain  Did not sleep well Oxycodone not helping  He is tearful and appears very uncomfortable    Review of Systems  Constitutional: Negative for chills and fever.  Respiratory: Negative for shortness of breath and wheezing.   Cardiovascular: Negative for chest pain and palpitations.  Gastrointestinal: Negative for nausea and vomiting.  Neurological: Negative for tingling and sensory change.    Objective:   VITALS:   Vitals:   09/04/18 2042 09/05/18 0518 09/05/18 0755 09/05/18 0930  BP: (!) 148/91 (!) 144/95  (!) 144/88  Pulse: 95 74  90  Resp: Temp: 98.4 F (36.9 C) 98.4 F (36.9 C)  98.1 F (36.7 C)  TempSrc: Oral Oral  Oral  SpO2: 100% 100% 99% 100%  Weight:      Height:        Estimated body mass index is 28.13 kg/m as calculated from the following:   Height as of this encounter:  (1.727 m).   Weight as of this encounter: 83.9 kg.   Intake/Output      04/17 0701 - 04/18 0700 04/18 0701 - 04/19 0700   P.O. 940    I.V. (mL/kg) 1470 (17.5)    Total Intake(mL/kg) 2410 (28.7)    Urine (mL/kg/hr) 2050 (1) 800 (2.3)   Blood 150    Total Output 2200 800   Net +210 -800          LABS  Results for orders placed or performed during the hospital encounter of 08/31/18 (from the past 24 hour(s))  CBC     Status: Abnormal   Collection Time: 09/05/18  2:42 AM  Result Value Ref Range   WBC 11.0 (H) 4.0 - 10.5 K/uL   RBC 4.24 4.22 - 5.81 MIL/uL   Hemoglobin 12.2 (L) 13.0 - 17.0 g/dL   HCT 40.9 (L) 81.1 - 91.4 %   MCV 87.3 80.0 - 100.0 fL   MCH 28.8 26.0 - 34.0 pg   MCHC 33.0 30.0 - 36.0 g/dL   RDW 78.2 95.6 - 21.3 %   Platelets 252 150 - 400 K/uL   nRBC 0.0 0.0 - 0.2 %    Results for Jacob Ho (MRN [id number]) as  of 09/05/2018 11:18  Ref. Range 09/01/2018 11:25  Vit D, 1,25-Dihydroxy Latest Ref Range: 19.9 - 79.3 pg/mL 37.5  Vitamin D, 25-Hydroxy Latest Ref Range: 30.0 - 100.0 ng/mL 8.3 (L)   PHYSICAL EXAM:    Gen: lying in bed, appears uncomfortable, tearful  Lungs: clear anterior fields, no wheezes Cardiac: RRR  Abd: + BS, NTND Ext:       Right Lower Extremity   Knee immobilizer in place  Dressing c/d/i  Ext warm   + DP pulse  EHL, FHL, lesser toe motor function intact  DPN, SPN, TN sensation intact  No DCT   Compartments soft   No pain out of proportion with passive stretching     Assessment/Plan: 1 Day Post-Op   Principal Problem:   Tibial plateau fracture   Anti-infectives (From admission, onward)   Start     Dose/Rate Route Frequency Ordered Stop   09/04/18 2200  ceFAZolin (ANCEF) IVPB 2g/100  mL premix     2 g 200 mL/hr over 30 Minutes Intravenous Every 8 hours 09/04/18 1837 09/05/18 2159   09/04/18 0600  ceFAZolin (ANCEF) IVPB 2g/100 mL premix     2 g 200 mL/hr over 30 Minutes Intravenous On call to O.R. 09/03/18 1016 09/04/18 1442   09/01/18 1900  ceFAZolin (ANCEF) IVPB 1 g/50 mL premix     1 g 100 mL/hr over 30 Minutes Intravenous Every 6 hours 09/01/18 1640 09/02/18 1259   09/01/18 0800  ceFAZolin (ANCEF) IVPB 2g/100 mL premix     2 g 200 mL/hr over 30 Minutes Intravenous On call to O.R. 08/31/18 2314 09/01/18 1452    .  POD/HD#: 1  43 y/o male s/p fall from height with R tibial plateau fracture    - complex R tibial plateau fracture s/p ORIF                           NWB x 8 weeks             ROM as tolerated post op   Hinged knee brace ordered               PT/OT             Continue with aggressive ice and elevation   Toe and ankle motion as tolerated    - Pain management:             made changes to pain management regimen    Norco 10/325 1-2 po  q6h prn severe pain    Dilaudid 0.5-1mg  IV q2h prn severe breakthrough pain    Ketorolac 30 mg IV q8h x  3 days   Gabapentin 300 mg po q12h   Robaxin 750 mg po q8h   - ABL anemia/Hemodynamics             Stable, monitor    - Medical issues              Acute renal injury                         Resolved                         Likely dehydration mediated              Acute leukocytosis                         Resolved                         Stress mediated                Asthma                         Continue with home meds                         No wheezing noted today                HgbA1c                         Normal                Nicotine  dependence                         No nicotine products of any kind due to negative effects on bone and wound healing                             - DVT/PE prophylaxis:             lovenox              Recommend lovenox x 4 weeks post op  - ID:              periop abx    - Metabolic Bone Disease:             + vitamin d deficiency               Supplement    - Activity:             NWB R leg   - FEN/GI prophylaxis/Foley/Lines:             reg diet              Continue with IVF             protonix    - Impediments to fracture healing:             Nicotine dependence   Complex fracture pattern    - Dispo:             therapies   Anticipated discharge Monday-Tuesday      Mearl LatinKeith W. Gavin Faivre, PA-C [phone] (C) 09/05/2018, 11:10 AM  [facility] [address] [place] [address] [phone] Collier Bullock(O) [phone] (F)

## 2018-09-05 NOTE — Progress Notes (Signed)
Orthopedic Tech Progress Note Patient Details:  Jacob Ho 1975/08/07 [id number]  Patient ID: Jacob Ho, male   DOB: 1975/08/12, 43 y.o.   MRN: [id number]   Jacob Ho 09/05/2018, 12:38 PMCalled [employer] for right Hinged knee brace.

## 2018-09-06 ENCOUNTER — Encounter (HOSPITAL_COMMUNITY): Payer: Self-pay | Admitting: Orthopedic Surgery

## 2018-09-06 DIAGNOSIS — E559 Vitamin D deficiency, unspecified: Secondary | ICD-10-CM

## 2018-09-06 HISTORY — DX: Vitamin D deficiency, unspecified: E55.9

## 2018-09-06 LAB — DRUG SCREEN 10 W/CONF, SERUM
Amphetamines, IA: NEGATIVE ng/mL
Barbiturates, IA: NEGATIVE ug/mL
Benzodiazepines, IA: NEGATIVE ng/mL
Cocaine & Metabolite, IA: NEGATIVE ng/mL
Methadone, IA: NEGATIVE ng/mL
Opiates, IA: POSITIVE ng/mL
Oxycodones, IA: NEGATIVE ng/mL
Phencyclidine, IA: NEGATIVE ng/mL
Propoxyphene, IA: NEGATIVE ng/mL
THC(Marijuana) Metabolite, IA: POSITIVE ng/mL

## 2018-09-06 LAB — CBC
HCT: 34.5 % — ABNORMAL LOW (ref 39.0–52.0)
Hemoglobin: 11.5 g/dL — ABNORMAL LOW (ref 13.0–17.0)
MCH: 29.4 pg (ref 26.0–34.0)
MCHC: 33.3 g/dL (ref 30.0–36.0)
MCV: 88.2 fL (ref 80.0–100.0)
Platelets: 254 10*3/uL (ref 150–400)
RBC: 3.91 MIL/uL — ABNORMAL LOW (ref 4.22–5.81)
RDW: 14 % (ref 11.5–15.5)
WBC: 11 10*3/uL — ABNORMAL HIGH (ref 4.0–10.5)
nRBC: 0 % (ref 0.0–0.2)

## 2018-09-06 LAB — BASIC METABOLIC PANEL
Anion gap: 8 (ref 5–15)
BUN: 15 mg/dL (ref 6–20)
CO2: 25 mmol/L (ref 22–32)
Calcium: 8.5 mg/dL — ABNORMAL LOW (ref 8.9–10.3)
Chloride: 105 mmol/L (ref 98–111)
Creatinine, Ser: 1.12 mg/dL (ref 0.61–1.24)
GFR calc Af Amer: >60 mL/min (ref >60)
GFR calc non Af Amer: >60 mL/min (ref >60)
Glucose, Bld: 108 mg/dL — ABNORMAL HIGH (ref 70–99)
Potassium: 4.3 mmol/L (ref 3.5–5.1)
Sodium: 138 mmol/L (ref 135–145)

## 2018-09-06 LAB — THC,MS,WB/SP RFX
Cannabidiol: NEGATIVE ng/mL
Cannabinoid Confirmation: POSITIVE
Cannabinol: NEGATIVE ng/mL
Carboxy-THC: 163.2 ng/mL
Hydroxy-THC: 3.8 ng/mL
Tetrahydrocannabinol(THC): 7.3 ng/mL

## 2018-09-06 MED ORDER — ALPRAZOLAM 0.25 MG PO TABS: 0.2500 mg | ORAL_TABLET | Freq: Every evening | ORAL | Status: DC | PRN

## 2018-09-06 MED ORDER — HYDROXYZINE HCL 25 MG PO TABS: 25.0000 mg | ORAL_TABLET | Freq: Three times a day (TID) | ORAL | Status: DC | PRN

## 2018-09-06 NOTE — Progress Notes (Signed)
OT Cancellation Note  Patient Details Name: Jacob Ho MRN: [id number] DOB: July 01, 1975   Cancelled Treatment:    Reason Eval/Treat Not Completed: Patient declined, no reason specified;Other (comment) pt with increased agitation and declines to engage in therapy. Will follow.  Chancy Milroy, OT Acute Rehabilitation Services Pager [phone] Office [phone]    Chancy Milroy 09/06/2018, 9:09 AM

## 2018-09-06 NOTE — Progress Notes (Signed)
PT Cancellation Note  Patient Details Name: Jacob Ho MRN: [id number] DOB: 1975/06/27   Cancelled Treatment:    Reason Eval/Treat Not Completed: Pain limiting ability to participate;Other (comment) Agitation. Attempted to check in with patient for therapy reassessment. Patient agitated and cursing at staff not providing pain medication despite the fact that pain medications and muscle relaxor just given within the hour and nursing just leaving the room to provide additional pain medication. Attempts to redirect only further patient's agitation. At this time, patient refusing therapy services and remains verbally agitated, advised patient that cursing was not needed, as patient remained agitated informed patient that therapies would not re-attempt today, will hold for today and await improved behavioral interactions before proceeding with therapies.     Fabio Asa 09/06/2018, 9:08 AM Charlotte Crumb, PT DPT  Board Certified Neurologic Specialist Acute Rehabilitation Services Pager [phone] Office [phone]

## 2018-09-06 NOTE — Progress Notes (Signed)
     Subjective: 2 Days Post-Op Procedure(s) (LRB): OPEN REDUCTION INTERNAL FIXATION (ORIF) TIBIAL PLATEAU (Right) REMOVAL EXTERNAL FIXATION LEG (Right) Awake, alert and oriented x 4. I want a different therapist, this therapist and I did not get along. Complaints of pain.   Patient reports pain as moderate.    Objective:   VITALS:  Temp:  [98.8 F (37.1 C)-99 F (37.2 C)] 98.9 F (37.2 C) (04/19 0342) Pulse Rate:  [66-89] 66 (04/19 0342) Resp:  [16-18] 16 (04/19 0342) BP: (123-132)/(73-83) 130/83 (04/19 0342) SpO2:  [98 %-100 %] 98 % (04/19 0759)  Neurologically intact ABD soft Neurovascular intact Sensation intact distally Intact pulses distally Dorsiflexion/Plantar flexion intact Incision: dressing C/D/I and no drainage Compartment soft Right bledsoe brace intact. Deputies are at bedside.    LABS Recent Labs    09/04/18 0045 09/05/18 0242 09/06/18 0313  HGB 11.8* 12.2* 11.5*  WBC 8.3 11.0* 11.0*  PLT 201 252 254   Recent Labs    09/04/18 0045 09/06/18 0313  NA 140 138  K 3.8 4.3  CL 107 105  CO2 28 25  BUN 12 15  CREATININE 1.11 1.12  GLUCOSE 94 108*   Recent Labs    09/04/18 0045  INR 1.1     Assessment/Plan: 2 Days Post-Op Procedure(s) (LRB): OPEN REDUCTION INTERNAL FIXATION (ORIF) TIBIAL PLATEAU (Right) REMOVAL EXTERNAL FIXATION LEG (Right)  Advance diet Up with therapy D/C IV fluids Plan for discharge tomorrow  Vira Browns 09/06/2018, 11:48 AMPatient ID: Jacob Ho, male   DOB: 05/25/1975, 43 y.o.   MRN: [id number]

## 2018-09-06 NOTE — Progress Notes (Signed)
Orthopedic Trauma Service Progress Note  Patient ID: Jacob Ho MRN: [id number] DOB/AGE: [birth year] 43 y.o.  Subjective:  Moderate pain earlier this am  Doing better now Got PO meds around 0800 and some IV dilaudid aroun 0900  Reviewed PT/OT notes and talked with pt and deputy in room   Pt not agitated currently and would like to work with therapy.  He was in too much pain to work with therapy when they came in this am which is why he did not want to work with them   Overall pain gradually improving PO meds are helping but currently IV meds most helpful  Does not appear that is scheduled toradol was given yesterday, unsure as to why  + flatus + void + BM    Review of Systems  Constitutional: Negative for chills and fever.  Respiratory: Negative for shortness of breath and wheezing.   Cardiovascular: Negative for chest pain and palpitations.  Gastrointestinal: Negative for abdominal pain, nausea and vomiting.  Neurological: Negative for tingling and sensory change.    Objective:   VITALS:   Vitals:   09/05/18 1955 09/05/18 2008 09/06/18 0342 09/06/18 0759  BP: 123/73  130/83   Pulse: 73  66   Resp: 18  16   Temp: 99 F (37.2 C)  98.9 F (37.2 C)   TempSrc: Oral  Oral   SpO2: 100% 99% 99% 98%  Weight:      Height:        Estimated body mass index is 28.13 kg/m as calculated from the following:   Height as of this encounter:  (1.727 m).   Weight as of this encounter: 83.9 kg.   Intake/Output      04/18 0701 - 04/19 0700 04/19 0701 - 04/20 0700   P.O. 720    I.V. (mL/kg) 1741.7 (20.8)    IV Piggyback 300    Total Intake(mL/kg) 2761.7 (32.9)    Urine (mL/kg/hr) 3400 (1.7)    Blood     Total Output 3400    Net -638.3           LABS  Results for orders placed or performed during the hospital encounter of 08/31/18 (from the past 24 hour(s))  CBC     Status: Abnormal   Collection Time: 09/06/18  3:13 AM  Result Value Ref Range   WBC 11.0 (H) 4.0 - 10.5 K/uL   RBC 3.91 (L) 4.22 - 5.81 MIL/uL   Hemoglobin 11.5 (L) 13.0 - 17.0 g/dL   HCT 40.9 (L) 81.1 - 91.4 %   MCV 88.2 80.0 - 100.0 fL   MCH 29.4 26.0 - 34.0 pg   MCHC 33.3 30.0 - 36.0 g/dL   RDW 78.2 95.6 - 21.3 %   Platelets 254 150 - 400 K/uL   nRBC 0.0 0.0 - 0.2 %  Basic metabolic panel     Status: Abnormal   Collection Time: 09/06/18  3:13 AM  Result Value Ref Range   Sodium 138 135 - 145 mmol/L   Potassium 4.3 3.5 - 5.1 mmol/L   Chloride 105 98 - 111 mmol/L   CO2 25 22 - 32 mmol/L   Glucose, Bld 108 (H) 70 - 99 mg/dL   BUN 15 6 - 20 mg/dL   Creatinine, Ser 0.86 0.61 - 1.24 mg/dL  Calcium 8.5 (L) 8.9 - 10.3 mg/dL   GFR calc non Af Amer >60 >60 mL/min   GFR calc Af Amer >60 >60 mL/min   Anion gap 8 5 - 15     PHYSICAL EXAM:   Gen: sitting up in bed, appears well, currently in a good mood, A&O x 3 Lungs: clear, good air movement  Cardiac: RRR Abd: + BS, NTND  Ext:       Right Lower Extremity              Hinged knee brace fitting well              Dressing c/d/i             Ext warm              + DP pulse             EHL, FHL, lesser toe motor function intact             DPN, SPN, TN sensation intact             No DCT              Compartments soft              No pain out of proportion with passive stretching                 Assessment/Plan: 2 Days Post-Op   Principal Problem:   Tibial plateau fracture Active Problems:   Asthma   Vitamin D deficiency   Anti-infectives (From admission, onward)   Start     Dose/Rate Route Frequency Ordered Stop   09/04/18 2200  ceFAZolin (ANCEF) IVPB 2g/100 mL premix     2 g 200 mL/hr over 30 Minutes Intravenous Every 8 hours 09/04/18 1837 09/05/18 1340   09/04/18 0600  ceFAZolin (ANCEF) IVPB 2g/100 mL premix     2 g 200 mL/hr over 30 Minutes Intravenous On call to O.R. 09/03/18 1016 09/04/18 1442   09/01/18 1900  ceFAZolin  (ANCEF) IVPB 1 g/50 mL premix     1 g 100 mL/hr over 30 Minutes Intravenous Every 6 hours 09/01/18 1640 09/02/18 1259   09/01/18 0800  ceFAZolin (ANCEF) IVPB 2g/100 mL premix     2 g 200 mL/hr over 30 Minutes Intravenous On call to O.R. 08/31/18 2314 09/01/18 1452    .  POD/HD#: 2  43 y/o male s/p fall from height with R tibial plateau fracture    - complex R tibial plateau fracture s/p ORIF                NWB x 8 weeks             ROM as tolerated post op                         Hinged knee brace              PT/OT             Continue with aggressive ice and elevation              Toe and ankle motion as tolerated   Dressing change tomorrow    - Pain management:             continue with current regimen (also discussed with RN today about ensuring pt receives scheduled toradol)  Norco 10/325 1-2 po  q6h prn severe pain                          Dilaudid 0.5-1mg  IV q2h prn severe breakthrough pain                          Ketorolac 30 mg IV q8h x 3 days                         Gabapentin 300 mg po q12h                         Robaxin 750 mg po q8h   - ABL anemia/Hemodynamics             Stable, monitor    - Medical issues              Acute renal injury                         Resolved                         Likely dehydration mediated               Acute leukocytosis                         Resolved                         Stress mediated                Asthma                         Continue with home meds                         No wheezing noted today                HgbA1c                         Normal                Nicotine dependence                         No nicotine products of any kind due to negative effects on bone and wound healing      - DVT/PE prophylaxis:             lovenox              Recommend lovenox x 4 weeks post op  - ID:              periop abx completed    - Metabolic Bone Disease:             + vitamin  d deficiency                          Supplement    - Activity:             NWB R leg   - FEN/GI prophylaxis/Foley/Lines:  reg diet              NSL              protonix    - Impediments to fracture healing:             Nicotine dependence              Complex fracture pattern    - Dispo:             therapies              Anticipated discharge Tuesday    Mearl Latin, PA-C [phone] (C) 09/06/2018, 9:41 AM  [facility] [address] [place] [address] [phone] Collier Bullock (F)

## 2018-09-06 NOTE — Progress Notes (Addendum)
Montez Morita PA and I spoke about having someone from PT come back this afternoon to work with pt, since pt agreed to work with PT later today. Called Charlotte Crumb from PT, she explained no one else would be available to come this afternoon due to limited weekend staff. PT will resume tomorrow.

## 2018-09-06 NOTE — Progress Notes (Signed)
Physical Therapy Treatment Patient Details Name: Jacob Ho MRN: [id number] DOB: 1976/02/12 Today's Date: 09/06/2018    History of Present Illness Pt is a 43 y.o. male admitted 08/31/18 after sustaining R tibial plateau fx after supposedly running from [employer]. Now s/p R lower leg external fixation 4/14. May return to OR on 09/04/18 for ORIF of right tibial plateau fx. PMH includes asthma.    PT Comments    Patient began crutch training today, 2 LOB requiring hands on assistance to stabilize. Will need more practice, as well as stair training prior to d/c.   Follow Up Recommendations  Follow surgeon's recommendation for DC plan and follow-up therapies     Equipment Recommendations  Crutches    Recommendations for Other Services       Precautions / Restrictions Restrictions Weight Bearing Restrictions: Yes RLE Weight Bearing: Non weight bearing    Mobility  Bed Mobility Overal bed mobility: Needs Assistance Bed Mobility: Supine to Sit     Supine to sit: Supervision     General bed mobility comments: Pt performed  with self assisting RLE to edge of bed.  Increased time and effort noted.    Transfers Overall transfer level: Needs assistance Equipment used: Rolling walker (2 wheeled) Transfers: Sit to/from Stand Sit to Stand: Supervision Stand pivot transfers: Min assist;+2 safety/equipment          Ambulation/Gait Ambulation/Gait assistance: Min assist Gait Distance (Feet): 70 Feet Assistive device: 2 person hand held assist;Crutches Gait Pattern/deviations: Step-to pattern Gait velocity: decreased   General Gait Details: patient with 2 LOB with crutches requring hands on assist to prevent fall. otherwise supervisoin. needs more pratice and to stair train   Stairs             Wheelchair Mobility    Modified Rankin (Stroke Patients Only)       Balance Overall balance assessment: Needs assistance Sitting-balance support: Feet  supported Sitting balance-Leahy Scale: Good     Standing balance support: Bilateral upper extremity supported Standing balance-Leahy Scale: Poor                              Cognition Arousal/Alertness: Awake/alert Behavior During Therapy: WFL for tasks assessed/performed Overall Cognitive Status: Within Functional Limits for tasks assessed                                        Exercises      General Comments        Pertinent Vitals/Pain Pain Assessment: Faces Faces Pain Scale: Hurts even more Pain Location: RLE when standing Pain Descriptors / Indicators: Sharp;Grimacing;Moaning Pain Intervention(s): Limited activity within patient's tolerance;Monitored during session    Home Living                      Prior Function            PT Goals (current goals can now be found in the care plan section) Acute Rehab PT Goals PT Goal Formulation: With patient Time For Goal Achievement: 09/16/18 Potential to Achieve Goals: Good Progress towards PT goals: Progressing toward goals    Frequency    Min 5X/week      PT Plan Current plan remains appropriate    Co-evaluation              AM-PAC PT "6  Clicks" Mobility   Outcome Measure  Help needed turning from your back to your side while in a flat bed without using bedrails?: None Help needed moving from lying on your back to sitting on the side of a flat bed without using bedrails?: A Little Help needed moving to and from a bed to a chair (including a wheelchair)?: A Little Help needed standing up from a chair using your arms (e.g., wheelchair or bedside chair)?: A Little Help needed to walk in hospital room?: A Little Help needed climbing 3-5 steps with a railing? : A Little 6 Click Score: 19    End of Session Equipment Utilized During Treatment: Gait belt Activity Tolerance: Patient limited by pain Patient left: with call bell/phone within reach;Other (comment);in  bed Nurse Communication: Mobility status PT Visit Diagnosis: Other abnormalities of gait and mobility (R26.89)     Time: 1240-1300 PT Time Calculation (min) (ACUTE ONLY): 20 min  Charges:  $Gait Training: 8-22 mins                     [name], PT, DPT Acute Rehabilitation Services Pager: [phone] Office: [phone]     [name] 09/06/2018, 1:38 PM

## 2018-09-07 ENCOUNTER — Encounter (HOSPITAL_COMMUNITY): Payer: Self-pay | Admitting: Orthopedic Surgery

## 2018-09-07 NOTE — Progress Notes (Signed)
Occupational Therapy Treatment Patient Details Name: Jacob BobMichael Ho MRN: [id number] DOB: 03/11/1976 Today's Date: 09/07/2018    History of present illness Pt is a 43 y.o. male admitted 08/31/18 after [place]. Now s/p R lower leg external fixation 4/14. May return to OR on 09/04/18 for ORIF of right tibial plateau fx. PMH includes asthma.   OT comments  Pt progressing towards established OT goals and demonstrating increased activity tolerance. Pt performing functional transfers with Min Guard A and maintaining WB status; declined use of crutches with preference for RW. Providing education on compensatory techniques for LB bathing, LB dressing, and toilet hygiene. Will continue to follow acutely as admitted to optimize safety and independence with ADLs and functional mobility.    Follow Up Recommendations  Follow surgeon's recommendation for DC plan and follow-up therapies    Equipment Recommendations  None recommended by OT    Recommendations for Other Services      Precautions / Restrictions Precautions Precautions: Fall Restrictions Weight Bearing Restrictions: Yes RLE Weight Bearing: Non weight bearing       Mobility Bed Mobility Overal bed mobility: Needs Assistance Bed Mobility: Supine to Sit     Supine to sit: Supervision     General bed mobility comments: Pt performed  with self assisting RLE to edge of bed.  Increased time and effort noted.    Transfers Overall transfer level: Needs assistance Equipment used: Rolling walker (2 wheeled) Transfers: Sit to/from Stand Sit to Stand: Min guard Stand pivot transfers: Min guard       General transfer comment: Min Guard A for safety and pt maintaining WB status well    Balance Overall balance assessment: Needs assistance Sitting-balance support: Feet supported Sitting balance-Leahy Scale: Good     Standing balance support: Bilateral  upper extremity supported Standing balance-Leahy Scale: Poor                             ADL either performed or assessed with clinical judgement   ADL Overall ADL's : Needs assistance/impaired Eating/Feeding: Sitting;Independent           Lower Body Bathing: Min guard;Sitting/lateral leans;Sit to/from stand Lower Body Bathing Details (indicate cue type and reason): Educating pt on lateral leaning for LB bathing. Also discussing sponge bathes     Lower Body Dressing: Minimal assistance;Sitting/lateral leans;Sit to/from stand Lower Body Dressing Details (indicate cue type and reason): Educating pt on compensatory techniques for LB dressing including dressing injuried leg first, wearing baggy clothing, and lateral leaning for bringing over hips Toilet Transfer: Min guard;RW(stand pivot to recliner) Toilet Transfer Details (indicate cue type and reason): Min Guard A for safety   Toileting - Clothing Manipulation Details (indicate cue type and reason): Educating pt on lateral leaning for toilet hygiene     Functional mobility during ADLs: Minimal assistance;Rolling walker(stand pivot to recliner with RW) General ADL Comments: Pt demonstrating increased activity tolerance. main limitation is pain. Educating pt on importance of OOB activity for healing and pain management     Vision       Perception     Praxis      Cognition Arousal/Alertness: Awake/alert Behavior During Therapy: WFL for tasks assessed/performed Overall Cognitive Status: Within Functional Limits for tasks assessed  Exercises     Shoulder Instructions       General Comments Police at bedside    Pertinent Vitals/ Pain       Pain Assessment: 0-10 Pain Score: 7  Pain Location: RLE when standing Pain Descriptors / Indicators: Sharp;Grimacing;Moaning Pain Intervention(s): Monitored during session;Limited activity within patient's  tolerance;Repositioned  Home Living                                          Prior Functioning/Environment              Frequency  Min 2X/week        Progress Toward Goals  OT Goals(current goals can now be found in the care plan section)  Progress towards OT goals: Progressing toward goals  Acute Rehab OT Goals Patient Stated Goal:  less pain OT Goal Formulation: With patient Time For Goal Achievement: 09/09/18 ADL Goals Pt Will Perform Lower Body Dressing: with supervision;sit to/from stand Pt Will Transfer to Toilet: with supervision;ambulating Pt Will Perform Toileting - Clothing Manipulation and hygiene: with supervision;sit to/from stand  Plan Discharge plan remains appropriate    Co-evaluation                 AM-PAC OT "6 Clicks" Daily Activity     Outcome Measure   Help from another person eating meals?: None Help from another person taking care of personal grooming?: None Help from another person toileting, which includes using toliet, bedpan, or urinal?: A Little Help from another person bathing (including washing, rinsing, drying)?: A Little Help from another person to put on and taking off regular upper body clothing?: A Little Help from another person to put on and taking off regular lower body clothing?: A Little 6 Click Score: 20    End of Session Equipment Utilized During Treatment: Rolling walker  OT Visit Diagnosis: Unsteadiness on feet (R26.81);Pain Pain - Right/Left: Right Pain - part of body: Leg   Activity Tolerance Patient limited by pain   Patient Left in chair;with call bell/phone within reach;Other (comment)(sheriff in room)   Nurse Communication Mobility status        Time: 0102-7253 OT Time Calculation (min): 9 min  Charges: OT General Charges $OT Visit: 1 Visit OT Treatments $Self Care/Home Management : 8-22 mins  Tahjay Binion MSOT, OTR/L Acute Rehab Pager: [phone] Office:  [phone]   Theodoro Grist Mattye Verdone 09/07/2018, 9:04 AM

## 2018-09-07 NOTE — Progress Notes (Signed)
Orthopedic Trauma Service Progress Note  Patient ID: Jacob Ho MRN: [id number] DOB/AGE: [birth year] 43 y.o.  Subjective:  Doing well Pain improved Has worked with therapies    ROS As above   Objective:   VITALS:   Vitals:   09/06/18 2107 09/07/18 0447 09/07/18 0827 09/07/18 1303  BP: 117/83 120/77  134/82  Pulse: 87 73  76  Resp: 18 18  16   Temp: 99.5 F (37.5 C) 98.5 F (36.9 C)  99.1 F (37.3 C)  TempSrc: Oral Oral  Oral  SpO2: 100% 99% 98% 99%  Weight:      Height:        Estimated body mass index is 28.13 kg/m as calculated from the following:   Height as of this encounter: 5\' 8"  (1.727 m).   Weight as of this encounter: 83.9 kg.   Intake/Output      04/19 0701 - 04/20 0700 04/20 0701 - 04/21 0700   P.O. 1480 720   I.V. (mL/kg)     IV Piggyback     Total Intake(mL/kg) 1480 (17.6) 720 (8.6)   Urine (mL/kg/hr) 2600 (1.3) 1150 (1.9)   Total Output 2600 1150   Net -1120 -430          LABS  No results found for this or any previous visit (from the past 24 hour(s)).   PHYSICAL EXAM:   Gen: good spirits today, awake and alert, cooperative  Lungs: clear anterior fields Cardiac: regular  Ext:       Right Lower extremity   Dressing removed  All wounds look great  No signs of infection   Swelling well controlled  DPN, SPN, TN sensation intact  EHL, FHL, AT, PT, peroneals, gastroc motor intact  No DCT   Compartments are soft  No pain with passive stretch   Ext warm  + DP pulse    Assessment/Plan: 3 Days Post-Op   Principal Problem:   Tibial plateau fracture Active Problems:   Asthma   Vitamin D deficiency   Anti-infectives (From admission, onward)   Start     Dose/Rate Route Frequency Ordered Stop   09/04/18 2200  ceFAZolin (ANCEF) IVPB 2g/100 mL premix     2 g 200 mL/hr over 30 Minutes Intravenous Every 8 hours 09/04/18 1837 09/05/18 1340   09/04/18 0600   ceFAZolin (ANCEF) IVPB 2g/100 mL premix     2 g 200 mL/hr over 30 Minutes Intravenous On call to O.R. 09/03/18 1016 09/04/18 1442   09/01/18 1900  ceFAZolin (ANCEF) IVPB 1 g/50 mL premix     1 g 100 mL/hr over 30 Minutes Intravenous Every 6 hours 09/01/18 1640 09/02/18 1259   09/01/18 0800  ceFAZolin (ANCEF) IVPB 2g/100 mL premix     2 g 200 mL/hr over 30 Minutes Intravenous On call to O.R. 08/31/18 2314 09/01/18 1452    .  POD/HD#: 27  43 y/o male s/p fall from height with R tibial plateau fracture    - complex R tibial plateau fracture s/p ORIF                NWB x 8 weeks             ROM as tolerated post op  Hinged knee brace              PT/OT             Continue with aggressive ice and elevation              Toe and ankle motion as tolerated              Dressing changed today   Ok to shower and clean wound with soap and water only   Dressing changes every other day    Once drainage stops, can leave open to air   TED hose       - Pain management:             continue with current regimen (                         Norco 10/325 1-2 po  q6h prn severe pain                          Dilaudid 0.5-1mg  IV q2h prn severe breakthrough pain                          Ketorolac 30 mg IV q8h x 3 days                         Gabapentin 300 mg po q12h                         Robaxin 750 mg po q8h   - ABL anemia/Hemodynamics             Stable, monitor    - Medical issues              Acute renal injury                         Resolved                         Likely dehydration mediated               Acute leukocytosis                         marginal elevation    Afebrile                Asthma                         Continue with home meds                                      HgbA1c                         Normal                Nicotine dependence                         No nicotine products of any kind due to negative effects on bone and wound  healing      - DVT/PE prophylaxis:  lovenox              Recommend lovenox x 4 weeks post op  - ID:              periop abx completed    - Metabolic Bone Disease:             + vitamin d deficiency                          Supplement    - Activity:             NWB R leg   - FEN/GI prophylaxis/Foley/Lines:             reg diet              NSL              protonix    - Impediments to fracture healing:             Nicotine dependence              Complex fracture pattern    - Dispo:             therapies              Anticipated discharge tomorrow      Mearl Latin, PA-C [phone] (C) 09/07/2018, 2:19 PM  [facility] [address] [place] [address] [phone] Collier Bullock (F)

## 2018-09-07 NOTE — Progress Notes (Signed)
Physical Therapy Treatment Patient Details Name: Jacob BobMichael Ho MRN: [id number] DOB: 04/12/1976 Today's Date: 09/07/2018    History of Present Illness Pt is a 43 y.o. male admitted 08/31/18 after sustaining R tibial plateau fx [facility]. Now s/p R lower leg external fixation 4/14. May return to OR on 09/04/18 for ORIF of right tibial plateau fx. PMH includes asthma.    PT Comments    Patient was able tio increase his walking distance. He was fatigued after but reported no significant increase in pain. He wants to work with the crutches and the walker to see which works better for him. He will need stair training at some point as well. Therapy will continue to work on his functional mobility.    Follow Up Recommendations  Follow surgeon's recommendation for DC plan and follow-up therapies     Equipment Recommendations  Rolling walker with 5" wheels;Crutches(or crutches)    Recommendations for Other Services       Precautions / Restrictions Precautions Precautions: Fall Restrictions Weight Bearing Restrictions: Yes RLE Weight Bearing: Non weight bearing    Mobility  Bed Mobility Overal bed mobility: Needs Assistance Bed Mobility: Supine to Sit     Supine to sit: Supervision     General bed mobility comments: Pt performed  with self assisting RLE to edge of bed.   Transfers Overall transfer level: Needs assistance Equipment used: Rolling walker (2 wheeled) Transfers: Sit to/from Stand Sit to Stand: Min guard Stand pivot transfers: Min guard       General transfer comment: Min Guard A for safety and pt maintaining WB status well  Ambulation/Gait Ambulation/Gait assistance: Min guard Gait Distance (Feet): 100 Feet Assistive device: Rolling walker (2 wheeled) Gait Pattern/deviations: Step-to pattern     General Gait Details: Patient kept a slow and steady pace. He was able to ambualte 50' turn then come back without a  seated rest break. He reported fatigue after.    Stairs             Wheelchair Mobility    Modified Rankin (Stroke Patients Only)       Balance Overall balance assessment: Needs assistance Sitting-balance support: Feet supported Sitting balance-Leahy Scale: Good     Standing balance support: Bilateral upper extremity supported Standing balance-Leahy Scale: Poor                              Cognition Arousal/Alertness: Awake/alert Behavior During Therapy: WFL for tasks assessed/performed Overall Cognitive Status: Within Functional Limits for tasks assessed                                        Exercises      General Comments General comments (skin integrity, edema, etc.): Police at bedside       Pertinent Vitals/Pain Pain Assessment: 0-10 Pain Score: 6  Pain Location: RLE when standing Pain Descriptors / Indicators: Sharp;Grimacing;Aching Pain Intervention(s): Limited activity within patient's tolerance;Monitored during session;Premedicated before session;Patient requesting pain meds-RN notified    Home Living                      Prior Function            PT Goals (current goals can now be found in the care plan section) Acute Rehab PT Goals Patient Stated Goal:  less pain PT Goal Formulation: With patient Time For Goal Achievement: 09/16/18 Potential to Achieve Goals: Good Progress towards PT goals: Progressing toward goals    Frequency    Min 5X/week      PT Plan Current plan remains appropriate    Co-evaluation              AM-PAC PT "6 Clicks" Mobility   Outcome Measure  Help needed turning from your back to your side while in a flat bed without using bedrails?: None Help needed moving from lying on your back to sitting on the side of a flat bed without using bedrails?: A Little Help needed moving to and from a bed to a chair (including a wheelchair)?: A Little Help needed standing up from  a chair using your arms (e.g., wheelchair or bedside chair)?: A Little Help needed to walk in hospital room?: A Little Help needed climbing 3-5 steps with a railing? : A Little 6 Click Score: 19    End of Session Equipment Utilized During Treatment: Gait belt Activity Tolerance: Patient limited by pain Patient left: with call bell/phone within reach;Other (comment);in bed Nurse Communication: Mobility status PT Visit Diagnosis: Other abnormalities of gait and mobility (R26.89)     Time: 1587-2761 PT Time Calculation (min) (ACUTE ONLY): 22 min  Charges:  $Gait Training: 8-22 mins                        Dessie Coma PT DPT  09/07/2018, 4:43 PM

## 2018-09-07 NOTE — Plan of Care (Signed)
  Problem: Education: Goal: Knowledge of General Education information will improve Description Including pain rating scale, medication(s)/side effects and non-pharmacologic comfort measures Outcome: Progressing   Problem: Clinical Measurements: Goal: Respiratory complications will improve Outcome: Progressing   Problem: Clinical Measurements: Goal: Ability to maintain clinical measurements within normal limits will improve Outcome: Progressing   Problem: Activity: Goal: Risk for activity intolerance will decrease Outcome: Progressing

## 2018-09-07 NOTE — Progress Notes (Signed)
The patient is resting in bed.  A police officer is sitting at the bedside for security reason.

## 2018-09-08 ENCOUNTER — Observation Stay (HOSPITAL_COMMUNITY)
Admission: EM | Admit: 2018-09-08 | Discharge: 2018-09-09 | Disposition: A | Discharge status: Home / Self Care | Payer: Self-pay | Attending: Family Medicine | Admitting: Family Medicine

## 2018-09-08 ENCOUNTER — Other Ambulatory Visit: Payer: Self-pay

## 2018-09-08 DIAGNOSIS — D72829 Elevated white blood cell count, unspecified: Secondary | ICD-10-CM | POA: Insufficient documentation

## 2018-09-08 DIAGNOSIS — R252 Cramp and spasm: Secondary | ICD-10-CM | POA: Insufficient documentation

## 2018-09-08 DIAGNOSIS — N179 Acute kidney failure, unspecified: Secondary | ICD-10-CM | POA: Insufficient documentation

## 2018-09-08 DIAGNOSIS — R000 Tachycardia, unspecified: Secondary | ICD-10-CM | POA: Insufficient documentation

## 2018-09-08 DIAGNOSIS — R7989 Other specified abnormal findings of blood chemistry: Secondary | ICD-10-CM | POA: Insufficient documentation

## 2018-09-08 DIAGNOSIS — Z8249 Family history of ischemic heart disease and other diseases of the circulatory system: Secondary | ICD-10-CM | POA: Insufficient documentation

## 2018-09-08 DIAGNOSIS — Z79899 Other long term (current) drug therapy: Secondary | ICD-10-CM | POA: Insufficient documentation

## 2018-09-08 DIAGNOSIS — R079 Chest pain, unspecified: Principal | ICD-10-CM | POA: Insufficient documentation

## 2018-09-08 DIAGNOSIS — R778 Other specified abnormalities of plasma proteins: Secondary | ICD-10-CM

## 2018-09-08 DIAGNOSIS — E559 Vitamin D deficiency, unspecified: Secondary | ICD-10-CM | POA: Insufficient documentation

## 2018-09-08 DIAGNOSIS — Z87891 Personal history of nicotine dependence: Secondary | ICD-10-CM | POA: Insufficient documentation

## 2018-09-08 DIAGNOSIS — J45909 Unspecified asthma, uncomplicated: Secondary | ICD-10-CM | POA: Insufficient documentation

## 2018-09-08 DIAGNOSIS — Z9889 Other specified postprocedural states: Secondary | ICD-10-CM | POA: Insufficient documentation

## 2018-09-08 LAB — CBC WITH DIFFERENTIAL/PLATELET
Abs Immature Granulocytes: 0.21 10*3/uL — ABNORMAL HIGH (ref 0.00–0.07)
Basophils Absolute: 0 10*3/uL (ref 0.0–0.1)
Basophils Relative: 0 %
Eosinophils Absolute: 0.2 10*3/uL (ref 0.0–0.5)
Eosinophils Relative: 2 %
HCT: 42.7 % (ref 39.0–52.0)
Hemoglobin: 14.3 g/dL (ref 13.0–17.0)
Immature Granulocytes: 1 %
Lymphocytes Relative: 10 %
Lymphs Abs: 1.5 10*3/uL (ref 0.7–4.0)
MCH: 29.5 pg (ref 26.0–34.0)
MCHC: 33.5 g/dL (ref 30.0–36.0)
MCV: 88.2 fL (ref 80.0–100.0)
Monocytes Absolute: 1.3 10*3/uL — ABNORMAL HIGH (ref 0.1–1.0)
Monocytes Relative: 9 %
Neutro Abs: 11.6 10*3/uL — ABNORMAL HIGH (ref 1.7–7.7)
Neutrophils Relative %: 78 %
Platelets: 380 10*3/uL (ref 150–400)
RBC: 4.84 MIL/uL (ref 4.22–5.81)
RDW: 13.5 % (ref 11.5–15.5)
WBC: 14.9 10*3/uL — ABNORMAL HIGH (ref 4.0–10.5)
nRBC: 0 % (ref 0.0–0.2)

## 2018-09-08 LAB — BASIC METABOLIC PANEL
Anion gap: 11 (ref 5–15)
BUN: 24 mg/dL — ABNORMAL HIGH (ref 6–20)
CO2: 24 mmol/L (ref 22–32)
Calcium: 9.5 mg/dL (ref 8.9–10.3)
Chloride: 104 mmol/L (ref 98–111)
Creatinine, Ser: 1.34 mg/dL — ABNORMAL HIGH (ref 0.61–1.24)
GFR calc Af Amer: >60 mL/min (ref >60)
GFR calc non Af Amer: >60 mL/min (ref >60)
Glucose, Bld: 86 mg/dL (ref 70–99)
Potassium: 5.3 mmol/L — ABNORMAL HIGH (ref 3.5–5.1)
Sodium: 139 mmol/L (ref 135–145)

## 2018-09-08 LAB — D-DIMER, QUANTITATIVE: D-Dimer, Quant: 3.17 ug/mL-FEU — ABNORMAL HIGH (ref 0.00–0.50)

## 2018-09-08 LAB — URINALYSIS, ROUTINE W REFLEX MICROSCOPIC
Bilirubin Urine: NEGATIVE
Glucose, UA: NEGATIVE mg/dL
Hgb urine dipstick: NEGATIVE
Ketones, ur: 5 mg/dL — AB
Leukocytes,Ua: NEGATIVE
Nitrite: NEGATIVE
Protein, ur: NEGATIVE mg/dL
Specific Gravity, Urine: 1.021 (ref 1.005–1.030)
pH: 5 (ref 5.0–8.0)

## 2018-09-08 LAB — RAPID URINE DRUG SCREEN, HOSP PERFORMED
Amphetamines: NOT DETECTED
Barbiturates: NOT DETECTED
Benzodiazepines: NOT DETECTED
Cocaine: NOT DETECTED
Opiates: POSITIVE — AB
Tetrahydrocannabinol: POSITIVE — AB

## 2018-09-08 LAB — CREATININE, SERUM
Creatinine, Ser: 1.07 mg/dL (ref 0.61–1.24)
GFR calc Af Amer: >60 mL/min (ref >60)
GFR calc non Af Amer: >60 mL/min (ref >60)

## 2018-09-08 LAB — TROPONIN I
Troponin I: 0.04 ng/mL (ref <0.03)
Troponin I: 0.06 ng/mL (ref <0.03)

## 2018-09-08 MED ORDER — HYDROCODONE-ACETAMINOPHEN 10-325 MG PO TABS: 1.0000 | ORAL_TABLET | Freq: Four times a day (QID) | ORAL | 0 refills | Status: DC | PRN

## 2018-09-08 MED ORDER — MORPHINE SULFATE (PF) 4 MG/ML IV SOLN
4.0000 mg | Freq: Once | INTRAVENOUS | Status: AC
  Administered 2018-09-08: 22:00:00 4 mg via INTRAVENOUS
  Filled 2018-09-08: qty 1

## 2018-09-08 MED ORDER — METHOCARBAMOL 750 MG PO TABS: 750.0000 mg | ORAL_TABLET | Freq: Three times a day (TID) | ORAL | 0 refills | Status: DC | PRN

## 2018-09-08 MED ORDER — ENOXAPARIN SODIUM 40 MG/0.4ML ~~LOC~~ SOLN: 40.0000 mg | SUBCUTANEOUS | 0 refills | Status: DC

## 2018-09-08 MED ORDER — VITAMIN D 125 MCG (5000 UT) PO CAPS: 1.0000 | ORAL_CAPSULE | Freq: Every day | ORAL | 3 refills | Status: DC

## 2018-09-08 MED ORDER — ASCORBIC ACID 1000 MG PO TABS: 1000.0000 mg | ORAL_TABLET | Freq: Every day | ORAL | 0 refills | Status: AC

## 2018-09-08 MED ORDER — DIPHENHYDRAMINE HCL 50 MG/ML IJ SOLN
50.0000 mg | Freq: Once | INTRAMUSCULAR | Status: AC
  Administered 2018-09-08: 50 mg via INTRAVENOUS
  Filled 2018-09-08: qty 1

## 2018-09-08 MED ORDER — DIPHENHYDRAMINE HCL 25 MG PO CAPS: 50.0000 mg | ORAL_CAPSULE | Freq: Once | ORAL | Status: AC

## 2018-09-08 MED ORDER — HYDROCORTISONE NA SUCCINATE PF 100 MG IJ SOLR
200.0000 mg | Freq: Once | INTRAMUSCULAR | Status: AC
  Administered 2018-09-08: 20:00:00 200 mg via INTRAVENOUS
  Filled 2018-09-08: qty 4

## 2018-09-08 MED ORDER — ASPIRIN 81 MG PO CHEW
324.0000 mg | CHEWABLE_TABLET | Freq: Once | ORAL | Status: AC
  Administered 2018-09-08: 22:00:00 324 mg via ORAL
  Filled 2018-09-08: qty 4

## 2018-09-08 MED ORDER — DOCUSATE SODIUM 100 MG PO CAPS: 100.0000 mg | ORAL_CAPSULE | Freq: Two times a day (BID) | ORAL | 0 refills | Status: AC

## 2018-09-08 MED ORDER — PREDNISONE 20 MG PO TABS: 50.0000 mg | ORAL_TABLET | Freq: Four times a day (QID) | ORAL | Status: DC

## 2018-09-08 MED ORDER — DIPHENHYDRAMINE HCL 25 MG PO CAPS: 50.0000 mg | ORAL_CAPSULE | Freq: Once | ORAL | Status: DC

## 2018-09-08 MED ORDER — HYDROMORPHONE HCL 1 MG/ML IJ SOLN: 0.5000 mg | Freq: Three times a day (TID) | INTRAMUSCULAR | Status: DC | PRN

## 2018-09-08 MED ORDER — MORPHINE SULFATE (PF) 4 MG/ML IV SOLN
4.0000 mg | Freq: Once | INTRAVENOUS | Status: AC
  Administered 2018-09-08: 19:00:00 4 mg via INTRAVENOUS
  Filled 2018-09-08: qty 1

## 2018-09-08 MED ORDER — HYDROCORTISONE NA SUCCINATE PF 250 MG IJ SOLR: 200.0000 mg | Freq: Once | INTRAMUSCULAR | Status: DC

## 2018-09-08 NOTE — Progress Notes (Signed)
Nutrition Brief Note  RD pulled to chart secondary to LOS (day 8).   Wt Readings from Last 15 Encounters:  09/04/18 83.9 kg  08/31/18 83.9 kg  12/31/17 86.2 kg  06/03/17 85.7 kg  04/13/14 80.7 kg  02/18/14 82.6 kg   Jacob Ho is an 43 y.o. black male who was injured yesterday evening supposedly running from [facility].  Patient apparently jumped up on an object and then jumped down sustaining an injury to his right knee.  Patient attempted to bear weight but was unable to do so.  He was brought to [facility] for evaluation where he was found to have a complex right tibial plateau fracture.  No additional injuries were noted.    4/14- s/p procedure: CLOSED REDUCTION OF RIGHT TIBIAL PLATEAU; APPLICATION OF SPANNING EXTERNAL FIXATOR; MEASUREMENT OF COMPARTMENT PRESSURES ALL FOUR COMPARTMENTS; RIGHT KNEE ASPIRATION 4/17- s/p procedure: OPEN REDUCTION INTERNAL FIXATION (ORIF); TIBIAL PLATEAU (Right); REMOVAL EXTERNAL FIXATION LEG (Right)  Body mass index is 28.13 kg/m. Patient meets criteria for overweight based on current BMI.   Current diet order is regular, patient is consuming approximately 100% of meals at this time. Labs and medications reviewed.   No nutrition interventions warranted at this time. If nutrition issues arise, please consult RD.   Jacob Ho A. Mayford Knife, RD, LDN, CDCES Registered Dietitian II Certified Diabetes Care and Education Specialist Pager: [phone] After hours Pager: [phone]
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
## 2018-09-08 NOTE — Progress Notes (Signed)
Orthopedic Trauma Service Progress Note  Patient ID: Jacob Ho MRN: [id number] DOB/AGE: 22-Oct-1975 43 y.o.  Subjective:  No acute issues Slightly depressed mood today compared to previous days, likely due to him discharging today   Did well with PT yesterday, ambulated 180ft   ROS As above  Objective:   VITALS:   Vitals:   09/07/18 1303 09/07/18 2046 09/07/18 2125 09/08/18 0627  BP: 134/82  116/76 113/77  Pulse: 76  86 66  Resp: Temp: 99.1 F (37.3 C)  98.6 F (37 C) 98.7 F (37.1 C)  TempSrc: Oral  Oral Oral  SpO2: 99% 98% 100% 99%  Weight:      Height:        Estimated body mass index is 28.13 kg/m as calculated from the following:   Height as of this encounter:  (1.727 m).   Weight as of this encounter: 83.9 kg.   Intake/Output      04/20 0701 - 04/21 0700 04/21 0701 - 04/22 0700   P.O. 720 240   Total Intake(mL/kg) 720 (8.6) 240 (2.9)   Urine (mL/kg/hr) 2010 (1) 400 (1)   Total Output 2010 400   Net -1290 -160          LABS  Results for orders placed or performed during the hospital encounter of 08/31/18 (from the past 24 hour(s))  Creatinine, serum     Status: None   Collection Time: 09/08/18  6:06 AM  Result Value Ref Range   Creatinine, Ser 1.07 0.61 - 1.24 mg/dL   GFR calc non Af Amer >60 >60 mL/min   GFR calc Af Amer >60 >60 mL/min     PHYSICAL EXAM:   Gen: lying in bed, NAD, appears well  Lungs: breathing unlabored Cardiac: RRR Ext:      Right Lower Extremity   Dressing c/d/i  Hinged brace fitting well  Swelling controlled  Ext warm  + DP pulse  No DCT   Compartments are soft  Motor and sensory functions intact   Assessment/Plan: 4 Days Post-Op   Principal Problem:   Tibial plateau fracture Active Problems:   Asthma   Vitamin D deficiency   Anti-infectives (From admission, onward)   Start     Dose/Rate Route Frequency  Ordered Stop   09/04/18 2200  ceFAZolin (ANCEF) IVPB 2g/100 mL premix     2 g 200 mL/hr over 30 Minutes Intravenous Every 8 hours 09/04/18 1837 09/05/18 1340   09/04/18 0600  ceFAZolin (ANCEF) IVPB 2g/100 mL premix     2 g 200 mL/hr over 30 Minutes Intravenous On call to O.R. 09/03/18 1016 09/04/18 1442   09/01/18 1900  ceFAZolin (ANCEF) IVPB 1 g/50 mL premix     1 g 100 mL/hr over 30 Minutes Intravenous Every 6 hours 09/01/18 1640 09/02/18 1259   09/01/18 0800  ceFAZolin (ANCEF) IVPB 2g/100 mL premix     2 g 200 mL/hr over 30 Minutes Intravenous On call to O.R. 08/31/18 2314 09/01/18 1452    .  POD/HD#: 14  43 y/o male s/p fall from height with R tibial plateau fracture    - complex R tibial plateau fracture s/p ORIF                NWB x 8 weeks  ROM as tolerated post op                         Hinged knee brace              PT/OT             Continue with aggressive ice and elevation              Toe and ankle motion as tolerated              Dressing changes as needed                          Ok to shower and clean wound with soap and water only                         Dressing changes every other day                          Once drainage stops, can leave open to air              TED hose                  - Pain management:             dc pain management regimen                          Norco 10/325 1-2 po  q6h prn severe pain                          Robaxin 750 mg po q8h   - ABL anemia/Hemodynamics             Stable, monitor    - Medical issues              Acute renal injury                         Resolved                         Likely dehydration mediated               Acute leukocytosis                         marginal elevation                          Afebrile                Asthma                         Continue with home meds                 HgbA1c                         Normal                Nicotine dependence  No nicotine products of any kind due to negative effects on bone and wound healing      - DVT/PE prophylaxis:             lovenox x 28 more days   - ID:              periop abx completed    - Metabolic Bone Disease:             + vitamin d deficiency                          Supplement vitamin d 3 and vitamin c    - Activity:             NWB R leg   - FEN/GI prophylaxis/Foley/Lines:             reg diet    - Impediments to fracture healing:             Nicotine dependence              Complex fracture pattern    - Dispo:             dc today   Follow up in 10-14 days with ortho      Mearl Latin, PA-C [phone] (C) 09/08/2018, 11:59 AM  [facility] [address] [place] [address] [phone] Collier Bullock (F)
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
## 2018-09-08 NOTE — ED Triage Notes (Signed)
Pt d/c from here today from leg surgery. 30 mins ago sudden onset L sided sharp chest pain. Endorses diaphoresis, lightheadedness.

## 2018-09-08 NOTE — Progress Notes (Signed)
Physical Therapy Treatment Patient Details Name: Jacob Ho MRN: [id number] DOB: 03/09/1976 Today's Date: 09/08/2018    History of Present Illness Pt is a 43 y.o. male admitted 08/31/18 after [place] R [place] f[place]. Now s/p R lower leg external fixation 4/14. May return to OR on 09/04/18 for ORIF of right tibial plateau fx. PMH includes asthma.    PT Comments    Pt supine in bed on arrival.  He wished to save energy for d/c but did agree to stair training with chair transport to stair well.  Pt require min assistance to steady RW for B UE support to clear x 3 stairs.  Pt to d/c today.   Follow Up Recommendations  Follow surgeon's recommendation for DC plan and follow-up therapies     Equipment Recommendations  Rolling walker with 5" wheels;Crutches    Recommendations for Other Services       Precautions / Restrictions Precautions Precautions: Fall Restrictions Weight Bearing Restrictions: Yes RLE Weight Bearing: Non weight bearing    Mobility  Bed Mobility Overal bed mobility: Needs Assistance Bed Mobility: Supine to Sit     Supine to sit: Supervision     General bed mobility comments: Pt performed  with self assisting RLE to edge of bed.   Transfers Overall transfer level: Needs assistance Equipment used: Rolling walker (2 wheeled) Transfers: Sit to/from Stand Sit to Stand: Supervision         General transfer comment: Cues for hand placement.  Ambulation/Gait Ambulation/Gait assistance: Supervision Gait Distance (Feet): 6 Feet(x2 to and from stairs for stair training.  ) Assistive device: Rolling walker (2 wheeled) Gait Pattern/deviations: Step-to pattern Gait velocity: decreased   General Gait Details: Hop to pattern with cues for turning and backing to prepare for stair training.     Stairs Stairs: Yes Stairs assistance: Min assist Stair Management: No rails Number of Stairs:  3 General stair comments: Cues for sequencing and RW placement for stair negotiation.     Wheelchair Mobility    Modified Rankin (Stroke Patients Only)       Balance Overall balance assessment: Needs assistance   Sitting balance-Leahy Scale: Good       Standing balance-Leahy Scale: Poor                              Cognition Arousal/Alertness: Awake/alert Behavior During Therapy: WFL for tasks assessed/performed Overall Cognitive Status: Within Functional Limits for tasks assessed                                        Exercises      General Comments        Pertinent Vitals/Pain Pain Assessment: 0-10 Faces Pain Scale: Hurts little more Pain Location: RLE when standing Pain Descriptors / Indicators: Sharp;Grimacing;Aching Pain Intervention(s): Monitored during session;Repositioned    Home Living                      Prior Function            PT Goals (current goals can now be found in the care plan section) Acute Rehab PT Goals Patient Stated Goal:  less pain Potential to Achieve Goals: Good Progress towards PT goals: Progressing toward goals    Frequency    Min 5X/week  PT Plan Current plan remains appropriate    Co-evaluation              AM-PAC PT "6 Clicks" Mobility   Outcome Measure  Help needed turning from your back to your side while in a flat bed without using bedrails?: None Help needed moving from lying on your back to sitting on the side of a flat bed without using bedrails?: None Help needed moving to and from a bed to a chair (including a wheelchair)?: None Help needed standing up from a chair using your arms (e.g., wheelchair or bedside chair)?: None Help needed to walk in hospital room?: None Help needed climbing 3-5 steps with a railing? : A Little 6 Click Score: 23    End of Session Equipment Utilized During Treatment: Gait belt Activity Tolerance: Patient limited by  pain Patient left: with call bell/phone within reach;Other (comment);in bed Nurse Communication: Mobility status PT Visit Diagnosis: Other abnormalities of gait and mobility (R26.89)     Time: 9417-4081 PT Time Calculation (min) (ACUTE ONLY): 10 min  Charges:  $Gait Training: 8-22 mins                     Jacob Ho, PTA Acute Rehabilitation Services Pager [phone] Office [phone]     Jacob Ho 09/08/2018, 5:24 PM

## 2018-09-08 NOTE — ED Notes (Signed)
Girlfriend, tara, would like a call with an update as soon as possible at [phone]

## 2018-09-08 NOTE — Progress Notes (Signed)
The patient will be discharged with a Field seismologist via transport [place].  The patient has been given verbal and discharge instructions. He verbalizes understanding those instructions. The patient will be taken to a Magistrate office and then will call a family member for transportation.

## 2018-09-08 NOTE — ED Provider Notes (Signed)
Assumed care at change of shift, see prior note for complete history and physical exam, briefly- 42yo male dc from hospital today, admitted 8 days ago ORIF tibial plateau fracture. Sudden onset CP/SHOB today around 1700hrs today. Tachycardic intermittently, diaphoretic, tachypenic. Pain resolved with morphine and ASA. Trop 0.04 without EKG changes, d-dimer positive (possible due to surgery), awaiting CTA but needs to be premedicated. Repeat trop 0.06. If CTA negative- call cards for admission guidance (heparin vs trop trend). If CTA positive- consult hospitalist.  Physical Exam  BP (!) 154/92   Pulse (!) 102   Temp 98.4 F (36.9 C) (Oral)   Resp 18   SpO2 99%   Physical Exam  ED Course/Procedures   Clinical Course as of Sep 09 119  Tue Sep 08, 2018  1854 Patient's pain is improved with morphine and he is laying comfortably on stretcher   [KM]  2124 Given the patient's acute onset of chest pain and his tachycardia and recent orthopedic surgery, I will work him up for a pulmonary embolism.   [KM]  2124 Awaiting CTA.  Patient's pain is controlled.   [KM]  2200 Patient second troponin was noted to be 8.06.  And aspirin.  Still waiting on CTA.  Plan will be that if CTA is negative, consult with cardiology. Likely will need admission for trend troponin   [KM]    Clinical Course User Index [KM] Arlyn Dunning, PA-C    Procedures  MDM  CT for PE is negative for PE. Discussed with Dr. Preston Fleeting, ER attending, Hospitalist paged for admission for elevated troponin. Discussed plan of care with patient who is agreeable with plan, no needs at this time. HR currently 89RRR, respirations even and unlabored.  Case discussed with hospitalist who will see the patient.        Jeannie Fend, PA-C 09/09/18 0121    Little, Ambrose Finland, MD 09/13/18 1038

## 2018-09-08 NOTE — Plan of Care (Signed)
  Problem: Pain Management: Goal: Pain level will decrease with appropriate interventions Outcome: Progressing   Problem: Skin Integrity: Goal: Will show signs of wound healing Outcome: Progressing   

## 2018-09-08 NOTE — Care Management (Signed)
Case manager received call that patient would be better with a rolling walker. CM contacted Zack Blank with [employer] to request walker. Patient will discharge with deputy to magistrate office and then will go home. No further CM services needed.     Vance Peper, RN Case Manager  [phone]

## 2018-09-08 NOTE — Discharge Instructions (Signed)
Orthopaedic Trauma Service Discharge Instructions   General Discharge Instructions  WEIGHT BEARING STATUS: Nonweightbearing right leg  RANGE OF MOTION/ACTIVITY: Unrestricted range of motion right knee.  Knee brace to remain on at all times except for hygiene.  Try to keep knee fully extended (straight) when at rest.  Accomplish this by placing pillows under the ankle.  Bone health: Lab work-up during her hospital stay did show that you are vitamin D deficient.  Vitamin D is very important in promoting appropriate bone thinning and bone health.  You will remain on vitamin D supplementation until further notice.  Vitamin D3 5000 IUs daily.  We will also place you on vitamin C 1000 mg daily as this is also important for bone and soft tissue healing  Wound Care: Daily wound care starting on 09/10/2018.  Please see instructions below.  Once wound is completely dry you may leave the wound open to the air.  You may place a dressing over the incisions if they are rubbing against the brace.  Discharge Wound Care Instructions  Do NOT apply any ointments, solutions or lotions to pin sites or surgical wounds.  These prevent needed drainage and even though solutions like hydrogen peroxide kill bacteria, they also damage cells lining the pin sites that help fight infection.  Applying lotions or ointments can keep the wounds moist and can cause them to breakdown and open up as well. This can increase the risk for infection. When in doubt call the office.  Surgical incisions should be dressed daily.  If any drainage is noted, use one layer of adaptic, then gauze, Kerlix, and an ace wrap.  Once the incision is completely dry and without drainage, it may be left open to air out.  Showering may begin 36-48 hours later.  Cleaning gently with soap and water.  Traumatic wounds should be dressed daily as well.    One layer of adaptic, gauze, Kerlix, then ace wrap.  The adaptic can be discontinued once the  draining has ceased    If you have a wet to dry dressing: wet the gauze with saline the squeeze as much saline out so the gauze is moist (not soaking wet), place moistened gauze over wound, then place a dry gauze over the moist one, followed by Kerlix wrap, then ace wrap.  DVT/PE prophylaxis: Lovenox 40 mg injection daily x28 days  Diet: as you were eating previously.  Can use over the counter stool softeners and bowel preparations, such as Miralax, to help with bowel movements.  Narcotics can be constipating.  Be sure to drink plenty of fluids  PAIN MEDICATION USE AND EXPECTATIONS  You have likely been given narcotic medications to help control your pain.  After a traumatic event that results in an fracture (broken bone) with or without surgery, it is ok to use narcotic pain medications to help control one's pain.  We understand that everyone responds to pain differently and each individual patient will be evaluated on a regular basis for the continued need for narcotic medications. Ideally, narcotic medication use should last no more than 6-8 weeks (coinciding with fracture healing).   As a patient it is your responsibility as well to monitor narcotic medication use and report the amount and frequency you use these medications when you come to your office visit.   We would also advise that if you are using narcotic medications, you should take a dose prior to therapy to maximize you participation.  IF YOU ARE ON NARCOTIC  MEDICATIONS IT IS NOT PERMISSIBLE TO OPERATE A MOTOR VEHICLE (MOTORCYCLE/CAR/TRUCK/MOPED) OR HEAVY MACHINERY DO NOT MIX NARCOTICS WITH OTHER CNS (CENTRAL NERVOUS SYSTEM) DEPRESSANTS SUCH AS ALCOHOL   STOP SMOKING OR USING NICOTINE PRODUCTS!!!!  As discussed nicotine severely impairs your body's ability to heal surgical and traumatic wounds but also impairs bone healing.  Wounds and bone heal by forming microscopic blood vessels (angiogenesis) and nicotine is a vasoconstrictor  (essentially, shrinks blood vessels).  Therefore, if vasoconstriction occurs to these microscopic blood vessels they essentially disappear and are unable to deliver necessary nutrients to the healing tissue.  This is one modifiable factor that you can do to dramatically increase your chances of healing your injury.    (This means no smoking, no nicotine gum, patches, etc)  DO NOT USE NONSTEROIDAL ANTI-INFLAMMATORY DRUGS (NSAID'S)  Using products such as Advil (ibuprofen), Aleve (naproxen), Motrin (ibuprofen) for additional pain control during fracture healing can delay and/or prevent the healing response.  If you would like to take over the counter (OTC) medication, Tylenol (acetaminophen) is ok.  However, some narcotic medications that are given for pain control contain acetaminophen as well. Therefore, you should not exceed more than 4000 mg of tylenol in a day if you do not have liver disease.  Also note that there are may OTC medicines, such as cold medicines and allergy medicines that my contain tylenol as well.  If you have any questions about medications and/or interactions please ask your doctor/PA or your pharmacist.      ICE AND ELEVATE INJURED/OPERATIVE EXTREMITY  Using ice and elevating the injured extremity above your heart can help with swelling and pain control.  Icing in a pulsatile fashion, such as 20 minutes on and 20 minutes off, can be followed.    Do not place ice directly on skin. Make sure there is a barrier between to skin and the ice pack.    Using frozen items such as frozen peas works well as the conform nicely to the are that needs to be iced.  USE AN ACE WRAP OR TED HOSE FOR SWELLING CONTROL  In addition to icing and elevation, Ace wraps or TED hose are used to help limit and resolve swelling.  It is recommended to use Ace wraps or TED hose until you are informed to stop.    When using Ace Wraps start the wrapping distally (farthest away from the body) and wrap proximally  (closer to the body)   Example: If you had surgery on your leg or thing and you do not have a splint on, start the ace wrap at the toes and work your way up to the thigh        If you had surgery on your upper extremity and do not have a splint on, start the ace wrap at your fingers and work your way up to the upper arm  IF YOU ARE IN A SPLINT OR CAST DO NOT REMOVE IT FOR ANY REASON   If your splint gets wet for any reason please contact the office immediately. You may shower in your splint or cast as long as you keep it dry.  This can be done by wrapping in a cast cover or garbage back (or similar)  Do Not stick any thing down your splint or cast such as pencils, money, or hangers to try and scratch yourself with.  If you feel itchy take benadryl as prescribed on the bottle for itching  IF YOU ARE IN A CAM  BOOT (BLACK BOOT)  You may remove boot periodically. Perform daily dressing changes as noted below.  Wash the liner of the boot regularly and wear a sock when wearing the boot. It is recommended that you sleep in the boot until told otherwise  CALL THE OFFICE WITH ANY QUESTIONS OR CONCERNS: [phone]

## 2018-09-08 NOTE — Progress Notes (Signed)
The patient is resting, cooperative.  A police officer is present in the room for safety.

## 2018-09-09 ENCOUNTER — Encounter (HOSPITAL_COMMUNITY): Payer: Self-pay | Admitting: Radiology

## 2018-09-09 ENCOUNTER — Other Ambulatory Visit: Payer: Self-pay

## 2018-09-09 ENCOUNTER — Emergency Department (HOSPITAL_COMMUNITY): Payer: Self-pay

## 2018-09-09 DIAGNOSIS — R079 Chest pain, unspecified: Secondary | ICD-10-CM

## 2018-09-09 LAB — LIPID PANEL
Cholesterol: 213 mg/dL — ABNORMAL HIGH (ref 0–200)
HDL: 50 mg/dL (ref >40)
LDL Cholesterol: 152 mg/dL — ABNORMAL HIGH (ref 0–99)
Total CHOL/HDL Ratio: 4.3 RATIO
Triglycerides: 55 mg/dL (ref <150)
VLDL: 11 mg/dL (ref 0–40)

## 2018-09-09 LAB — BRAIN NATRIURETIC PEPTIDE: B Natriuretic Peptide: 19.2 pg/mL (ref 0.0–100.0)

## 2018-09-09 LAB — CBC
HCT: 42.8 % (ref 39.0–52.0)
Hemoglobin: 14.4 g/dL (ref 13.0–17.0)
MCH: 29.5 pg (ref 26.0–34.0)
MCHC: 33.6 g/dL (ref 30.0–36.0)
MCV: 87.7 fL (ref 80.0–100.0)
Platelets: 373 10*3/uL (ref 150–400)
RBC: 4.88 MIL/uL (ref 4.22–5.81)
RDW: 13.7 % (ref 11.5–15.5)
WBC: 10.4 10*3/uL (ref 4.0–10.5)
nRBC: 0 % (ref 0.0–0.2)

## 2018-09-09 LAB — BASIC METABOLIC PANEL
Anion gap: 12 (ref 5–15)
BUN: 24 mg/dL — ABNORMAL HIGH (ref 6–20)
CO2: 20 mmol/L — ABNORMAL LOW (ref 22–32)
Calcium: 8.5 mg/dL — ABNORMAL LOW (ref 8.9–10.3)
Chloride: 106 mmol/L (ref 98–111)
Creatinine, Ser: 1.13 mg/dL (ref 0.61–1.24)
GFR calc Af Amer: >60 mL/min (ref >60)
GFR calc non Af Amer: >60 mL/min (ref >60)
Glucose, Bld: 125 mg/dL — ABNORMAL HIGH (ref 70–99)
Potassium: 4.2 mmol/L (ref 3.5–5.1)
Sodium: 138 mmol/L (ref 135–145)

## 2018-09-09 LAB — TROPONIN I
Troponin I: 0.05 ng/mL (ref <0.03)
Troponin I: 0.04 ng/mL (ref <0.03)
Troponin I: <0.03 ng/mL (ref <0.03)
Troponin I: <0.03 ng/mL (ref <0.03)

## 2018-09-09 LAB — CREATININE, SERUM
Creatinine, Ser: 1.21 mg/dL (ref 0.61–1.24)
GFR calc Af Amer: >60 mL/min (ref >60)
GFR calc non Af Amer: >60 mL/min (ref >60)

## 2018-09-09 MED ORDER — VITAMIN C 500 MG PO TABS
1000.0000 mg | ORAL_TABLET | Freq: Every day | ORAL | Status: DC
  Administered 2018-09-09: 1000 mg via ORAL
  Filled 2018-09-09: qty 2

## 2018-09-09 MED ORDER — ALBUTEROL SULFATE (2.5 MG/3ML) 0.083% IN NEBU: 3.0000 mL | INHALATION_SOLUTION | RESPIRATORY_TRACT | Status: DC | PRN

## 2018-09-09 MED ORDER — IOHEXOL 350 MG/ML SOLN
80.0000 mL | Freq: Once | INTRAVENOUS | Status: AC | PRN
  Administered 2018-09-09: 80 mL via INTRAVENOUS

## 2018-09-09 MED ORDER — ACETAMINOPHEN 325 MG PO TABS: 650.0000 mg | ORAL_TABLET | ORAL | Status: DC | PRN

## 2018-09-09 MED ORDER — HYDROCODONE-ACETAMINOPHEN 10-325 MG PO TABS: 1.0000 | ORAL_TABLET | Freq: Four times a day (QID) | ORAL | 0 refills | Status: DC | PRN

## 2018-09-09 MED ORDER — ONDANSETRON HCL 4 MG/2ML IJ SOLN: 4.0000 mg | Freq: Four times a day (QID) | INTRAMUSCULAR | Status: DC | PRN

## 2018-09-09 MED ORDER — METHOCARBAMOL 500 MG PO TABS
750.0000 mg | ORAL_TABLET | Freq: Three times a day (TID) | ORAL | Status: DC | PRN
  Administered 2018-09-09: 750 mg via ORAL
  Filled 2018-09-09: qty 2

## 2018-09-09 MED ORDER — SODIUM CHLORIDE 0.9 % IV SOLN: INTRAVENOUS | Status: DC

## 2018-09-09 MED ORDER — MOMETASONE FURO-FORMOTEROL FUM 200-5 MCG/ACT IN AERO
2.0000 | INHALATION_SPRAY | Freq: Two times a day (BID) | RESPIRATORY_TRACT | Status: DC
  Administered 2018-09-09: 09:00:00 2 via RESPIRATORY_TRACT
  Filled 2018-09-09: qty 8.8

## 2018-09-09 MED ORDER — VITAMIN D 125 MCG (5000 UT) PO CAPS: 1.0000 | ORAL_CAPSULE | Freq: Every day | ORAL | 0 refills | Status: AC

## 2018-09-09 MED ORDER — VITAMIN D 25 MCG (1000 UNIT) PO TABS
5000.0000 [IU] | ORAL_TABLET | Freq: Every day | ORAL | Status: DC
  Administered 2018-09-09: 5000 [IU] via ORAL
  Filled 2018-09-09: qty 5

## 2018-09-09 MED ORDER — METHOCARBAMOL 750 MG PO TABS: 750.0000 mg | ORAL_TABLET | Freq: Three times a day (TID) | ORAL | 0 refills | Status: AC | PRN

## 2018-09-09 MED ORDER — ALBUTEROL SULFATE (2.5 MG/3ML) 0.083% IN NEBU: 2.5000 mg | INHALATION_SOLUTION | Freq: Once | RESPIRATORY_TRACT | Status: DC

## 2018-09-09 MED ORDER — HYDROCODONE-ACETAMINOPHEN 10-325 MG PO TABS
1.0000 | ORAL_TABLET | Freq: Four times a day (QID) | ORAL | Status: DC | PRN
  Administered 2018-09-09 (×3): 2 via ORAL
  Filled 2018-09-09 (×3): qty 2

## 2018-09-09 MED ORDER — ENOXAPARIN SODIUM 40 MG/0.4ML ~~LOC~~ SOLN
40.0000 mg | SUBCUTANEOUS | Status: DC
  Administered 2018-09-09: 07:00:00 40 mg via SUBCUTANEOUS
  Filled 2018-09-09: qty 0.4

## 2018-09-09 MED ORDER — ENOXAPARIN SODIUM 40 MG/0.4ML ~~LOC~~ SOLN: 40.0000 mg | SUBCUTANEOUS | 0 refills | Status: DC

## 2018-09-09 MED ORDER — SODIUM CHLORIDE 0.9 % IV SOLN
INTRAVENOUS | Status: DC
  Administered 2018-09-09: 03:00:00 via INTRAVENOUS

## 2018-09-09 MED ORDER — DOCUSATE SODIUM 100 MG PO CAPS
100.0000 mg | ORAL_CAPSULE | Freq: Two times a day (BID) | ORAL | Status: DC
  Administered 2018-09-09 (×2): 100 mg via ORAL
  Filled 2018-09-09 (×2): qty 1

## 2018-09-09 MED FILL — METHOCARBAMOL 750 MG TABS: 750 | 10 days supply | Qty: 30 | Fill #0

## 2018-09-09 MED FILL — ENOXAPARIN 40 MG/0.4 ML SYR: 40 | 10 days supply | Qty: 11 | Fill #0

## 2018-09-09 MED FILL — HYDROCODON-APAP 10-325: 10-325 | 4 days supply | Qty: 15 | Fill #0

## 2018-09-09 NOTE — Evaluation (Signed)
Occupational Therapy Evaluation Patient Details Name: Jacob BobMichael Wille MRN: [id number] DOB: 04/30/1976 Today's Date: 09/09/2018    History of Present Illness 43 y.o. male presenting with chest pain. PMH is significant for  has a past medical history of Asthma and Vitamin D deficiency, recent orthopedic fixation of a right tibial fracture.    Clinical Impression   Pt recently discharged from Hospital, and is mod I with RW for transfers, has assist from girlfriend or mom for LB ADL. Educated pt on [employer] clothing choices. Pt is comfortable with bledsoe brace. Educated on sitting for grooming and bathing. Pt with no questions or concerns at this time. Education complete for ADL compensatory strategies and transfers and OT will sign off.     Follow Up Recommendations  Follow surgeon's recommendation for DC plan and follow-up therapies    Equipment Recommendations  None recommended by OT    Recommendations for Other Services       Precautions / Restrictions Precautions Precautions: Fall Required Braces or Orthoses: Other Brace(Bledsoe Brace locked in extension ) Restrictions Weight Bearing Restrictions: Yes RLE Weight Bearing: Non weight bearing      Mobility Bed Mobility Overal bed mobility: Modified Independent Bed Mobility: Supine to Sit;Sit to Supine     Supine to sit: Modified independent (Device/Increase time) Sit to supine: Modified independent (Device/Increase time)   General bed mobility comments: increased effort and time vc for use of hip flexors instead of hands to move R LE to EoB  Transfers Overall transfer level: Needs assistance Equipment used: Rolling walker (2 wheeled) Transfers: Sit to/from Stand Sit to Stand: Supervision         General transfer comment: Cues for hand placement.    Balance Overall balance assessment: Needs assistance Sitting-balance support: Feet supported Sitting balance-Leahy Scale: Good     Standing balance support: Bilateral  upper extremity supported Standing balance-Leahy Scale: Poor Standing balance comment: requires UE support                           ADL either performed or assessed with clinical judgement   ADL Overall ADL's : Modified independent                     Lower Body Dressing: Supervision/safety;Sitting/lateral leans;Sit to/from stand(educated on clothing choices for home.)   Toilet Transfer: Supervision/safety;RW;Ambulation Toilet Transfer Details (indicate cue type and reason): able to get on and off regular height toilet without assist - supervision for safety   Toileting - Clothing Manipulation Details (indicate cue type and reason): able to perform peri care in sitting       General ADL Comments: Pt states that even though he was only home for an hour he does not have any questions or concerns about ADL at home     Vision Patient Visual Report: No change from baseline       Perception     Praxis      Pertinent Vitals/Pain Pain Assessment: 0-10 Pain Score: 3  Pain Location: RLE when standing Pain Descriptors / Indicators: Sharp;Grimacing;Aching Pain Intervention(s): Limited activity within patient's tolerance;Monitored during session;Repositioned     Hand Dominance     Extremity/Trunk Assessment Upper Extremity Assessment Upper Extremity Assessment: Overall WFL for tasks assessed   Lower Extremity Assessment Lower Extremity Assessment: Defer to PT evaluation RLE Deficits / Details: s/p R tibial ORIF; hip flex strong although limited by pain   Cervical / Trunk Assessment Cervical / Trunk Assessment:  Normal   Communication Communication Communication: No difficulties   Cognition Arousal/Alertness: Awake/alert Behavior During Therapy: WFL for tasks assessed/performed Overall Cognitive Status: Within Functional Limits for tasks assessed                                     General Comments  Discussed at great length why RW is  superior to crutches given pt current stability with increased weight of R LE with Bledsoe brace    Exercises Exercises: General Lower Extremity General Exercises - Lower Extremity Ankle Circles/Pumps: AROM;Right;5 reps;Seated Quad Sets: AROM;Right;5 reps;Seated Gluteal Sets: AROM;Right;5 reps;Seated Short Arc Quad: AROM;Right;5 reps;Seated Long Arc Quad: AROM;Right;5 reps;Seated Heel Slides: AROM;Right;5 reps;Seated Hip ABduction/ADduction: AROM;Right;5 reps;Seated   Shoulder Instructions      Home Living Family/patient expects to be discharged to:: Private residence Living Arrangements: Spouse/significant other Available Help at Discharge: Family;Available 24 hours/day Type of Home: House Home Access: Stairs to enter [employer] of Steps: 3   Home Layout: Multi-level Alternate Level Stairs-Number of Steps: 3   Bathroom Shower/Tub: Chief Strategy Officer: Standard     Home Equipment: Environmental consultant - 2 wheels;Crutches          Prior Functioning/Environment Level of Independence: Independent with assistive device(s)        Comments: since dc has been independent in ADL and transfers with RW        OT Problem List: Pain;Decreased knowledge of precautions;Decreased activity tolerance      OT Treatment/Interventions:      OT Goals(Current goals can be found in the care plan section) Acute Rehab OT Goals Patient Stated Goal:  less pain OT Goal Formulation: With patient Time For Goal Achievement: 09/23/18 Potential to Achieve Goals: Good  OT Frequency:     Barriers to D/C:            Co-evaluation              AM-PAC OT "6 Clicks" Daily Activity     Outcome Measure Help from another person eating meals?: None Help from another person taking care of personal grooming?: None Help from another person toileting, which includes using toliet, bedpan, or urinal?: None Help from another person bathing (including washing, rinsing, drying)?: A  Little Help from another person to put on and taking off regular upper body clothing?: None Help from another person to put on and taking off regular lower body clothing?: A Little 6 Click Score: 22   End of Session Equipment Utilized During Treatment: Rolling walker Nurse Communication: Mobility status  Activity Tolerance: Patient tolerated treatment well Patient left: in bed  OT Visit Diagnosis: Unsteadiness on feet (R26.81);Pain Pain - Right/Left: Right Pain - part of body: Leg                Time: 1140-1155 OT Time Calculation (min): 15 min Charges:  OT General Charges $OT Visit: 1 Visit OT Evaluation $OT Eval Low Complexity: 1 Low  Sherryl Manges Jacob Ho: [phone] Office: [phone]  Evern Bio Ellason Segar 09/09/2018, 1:31 PM

## 2018-09-09 NOTE — H&P (Addendum)
Family Medicine Teaching [facility] Admission History and Physical Service Pager: [phone]  Patient name: Jacob Ho Medical record number: [id number] Date of birth: 04-08-1976 Age: 43 y.o. Gender: male  Primary Care Provider: System, Provider Not In Consultants: None Code Status:   Code Status: Full Code   Chief Complaint: Chest pain  Assessment and Plan: Brendin Situ is a 43 y.o. male presenting with chest pain. PMH is significant for  has a past medical history of Asthma and Vitamin D deficiency, recent orthopedic fixation of a right tibial fracture.  #Chest pain rule out Heart Score 3. Low risk for ACS. However, mildly elevated troponins do raise suspicion. Will monitor overnight.  Admit to FPTS 2, attending Dr. Pollie Meyer  Cardiac telemetry  VS per floor  Cont pulse ox  BNP  Trend troponin  #Right tibial fracture s/p orthopedic fixation In rigid fixation. Orthopedics recommendations in d/c summary include NWB x8 weeks, ROM as tolerated PO, cont hinged knee brace, PT/OT, TED hose, Toe and ankle motion as tolerated, lovenox x 28 more days, Vit D3 and Vit C.   PT/OT  oxycodone and robaxin for pain mgmt  Case Mgmt - patient reports not being able to afford medicines  Cont orthopedic recommendations  #Right arm spasm Report right arm spasm. New. No injury.   Oxycodone and robaxin for pain  #Leukocytosis 11 > 14.9. No s/s of active infection. CTA no PNA. Afebrile. UA negative. Likely stress reaction from recent surgery. May also have a leukemoid reaction from receiving steroids in ED for breathing.   #Asthma SORA. No wheeze on exam.   Cont home dulera  Albuterol prn  #AKI Mildly elevated Cr 1.34. Cr was trending up during prior admission.   mIVF  Trend Cr  FEN/GI: HHD Prophylaxis: Lovenox  Disposition: Observation  History of Present Illness:  [name] is a 43 y.o. male presenting with chest pain shortly after being released from  the hospital for orthopedic surgery on his right knee. Patient had closed reduction on 4/13 and 4/17 he had a ORIF of tibial plateau. His chest pain was left sided. It was associated with left arm pain and abdominal pain and SOB. He denies wheezing or other infectious symptoms like fever or cough. Nothing made the worse. The pain did improve with morphine and nitroglycerin received in the ED. Patient returned to the ED for further evaluation of his chest pain.   In the ED, VSS and SORA. Patient was found to have EKG w/o ST changes. BMP concerning for mild hyperkalemia of 5.3, mild Cr 1.34, leukocytosis 14.9 with a neutrophil predominance. UDS positive for THC and opiates. He had an elevated D-dimer of 3.17, raising concern for PE given recent surgery and immobilization. CTA Chest however, had no PE or cardiac abnormalities. Family medicine was consulted for chest pain rule out. Patient received IV solumedrol in ED for breathing. CP did improve after getting nitro and morphine Troponins have been mildly elevated 0.04 > 0.06 > 0.05.   Review Of Systems: Per HPI with the following additions:   Review of Systems  Constitutional: Negative for chills and fever.  HENT: Negative for congestion.   Respiratory: Positive for shortness of breath. Negative for cough and wheezing.   Cardiovascular: Positive for chest pain. Negative for leg swelling.  Gastrointestinal: Positive for abdominal pain. Negative for constipation, diarrhea, nausea and vomiting.  Genitourinary: Negative.   Neurological: Negative for headaches.  All other systems reviewed and are negative.   Patient Active Problem List  Diagnosis Date Noted  . Chest pain 09/09/2018  . Vitamin D deficiency 09/06/2018  . Tibial plateau fracture 08/31/2018  . Asthma 04/11/2014  . Asthma exacerbation 02/16/2014    Past Medical History: Past Medical History:  Diagnosis Date  . Asthma   . Vitamin D deficiency 09/06/2018    Past Surgical  History: Past Surgical History:  Procedure Laterality Date  . EXTERNAL FIXATION LEG Right 09/01/2018   Procedure: EXTERNAL FIXATION LEG;  Surgeon: Myrene Galas, MD;  Location[facility] OR;  Service: Orthopedics;  Laterality: Right;  . EXTERNAL FIXATION REMOVAL Right 09/04/2018   Procedure: REMOVAL EXTERNAL FIXATION LEG;  Surgeon: Myrene Galas, MD;  Location: [facility] OR;  Service: Orthopedics;  Laterality: Right;  . FINE NEEDLE ASPIRATION Right 09/01/2018   Procedure: Fine Needle Aspiration;  Surgeon: Myrene Galas, MD;  Location: [facility] OR;  Service: Orthopedics;  Laterality: Right;  . ORIF TIBIA PLATEAU Right 09/04/2018   Procedure: OPEN REDUCTION INTERNAL FIXATION (ORIF) TIBIAL PLATEAU;  Surgeon: Myrene Galas, MD;  Location: [facility] OR;  Service: Orthopedics;  Laterality: Right;    Social History: Social History   Tobacco Use  . Smoking status: Former Smoker    Start date: 05/21/1991    Last attempt to quit: 09/09/2013    Years since quitting: 5.0  . Smokeless tobacco: Never Used  . Tobacco comment: smoking black and milds 1 a day  Substance Use Topics  . Alcohol use: No  . Drug use: Yes    Comment: marijuana occasionally   Additional social history: Sustained right knee fracture running from [facility]. Last smoked a cigarrette about 1 week ago. Declining NRT. Please also refer to relevant sections of EMR.  Family History: Family History  Problem Relation Age of Onset  . Diabetes Mother   . Asthma Mother   . Heart attack Father   . Asthma Sister     Allergies and Medications: Allergies  Allergen Reactions  . Iodine Anaphylaxis    UNSPECIFIED REACTION   . Shellfish Allergy Anaphylaxis   No current facility-administered medications on file prior to encounter.    Current Outpatient Medications on File Prior to Encounter  Medication Sig Dispense Refill  . Cholecalciferol (VITAMIN D) 125 MCG (5000 UT) CAPS Take 1 capsule by mouth daily. 30 capsule 3  . docusate sodium  (COLACE) 100 MG capsule Take 1 capsule (100 mg total) by mouth 2 (two) times daily. 20 capsule 0  . enoxaparin (LOVENOX) 40 MG/0.4ML injection Inject 0.4 mLs (40 mg total) into the skin daily. 28 Syringe 0  . HYDROcodone-acetaminophen (NORCO) 10-325 MG tablet Take 1-2 tablets by mouth every 6 (six) hours as needed for moderate pain or severe pain. 56 tablet 0  . methocarbamol (ROBAXIN) 750 MG tablet Take 1 tablet (750 mg total) by mouth every 8 (eight) hours as needed for muscle spasms. 60 tablet 0  . mometasone-formoterol (DULERA) 200-5 MCG/ACT AERO Inhale 2 puffs into the lungs 2 (two) times daily. 1 Inhaler 6  . vitamin C (VITAMIN C) 1000 MG tablet Take 1 tablet (1,000 mg total) by mouth daily. 60 tablet 0    Objective: BP (!) 154/92   Pulse (!) 102   Temp 98.4 F (36.9 C) (Oral)   Resp 18   SpO2 99%  Exam: Physical Exam  Constitutional: He is oriented to person, place, and time and well-developed, well-nourished, and in no distress. No distress.  HENT:  Head: Normocephalic and atraumatic.  Mouth/Throat: No oropharyngeal exudate.  Eyes: Conjunctivae and EOM  are normal. Right eye exhibits no discharge. Left eye exhibits no discharge.  Neck: No JVD present.  Cardiovascular: Normal rate and regular rhythm. Exam reveals no gallop and no friction rub.  No murmur heard. Pulmonary/Chest: Effort normal and breath sounds normal. No respiratory distress. He has no wheezes. He has no rales. He exhibits no tenderness.  Abdominal: Soft. Bowel sounds are normal. He exhibits no distension. There is no abdominal tenderness. There is no rebound.  Musculoskeletal:     Comments: Right knee in brace. Normal ROM of Right Ankle. Normal ROM left leg. Moves lower extremities spontaneously.   Neurological: He is alert and oriented to person, place, and time. He exhibits normal muscle tone.  Skin: Skin is warm and dry. No rash noted. He is not diaphoretic. No erythema. No pallor.  Psychiatric: Affect and  judgment normal.     Labs and Imaging: CBC BMET  Recent Labs  Lab 09/08/18 1839  WBC 14.9*  HGB 14.3  HCT 42.7  PLT 380   Recent Labs  Lab 09/08/18 1839  NA 139  K 5.3*  CL 104  CO2 24  BUN 24*  CREATININE 1.34*  GLUCOSE 86  CALCIUM 9.5       Garnette Gunnerhompson, Camillo Quadros B, MD 09/09/2018, 2:01 AM PGY-2, [facility] FPTS Intern pager: [phone], text pages welcome

## 2018-09-09 NOTE — Progress Notes (Signed)
Patient being discharged from unit to home this date. All discharge instructions reviewed with patient. All personal belongings with patient. Patient demonstrates no s/sx of distress at this time. Encouraged to keep all follow up appts.

## 2018-09-09 NOTE — Discharge Summary (Signed)
Family Medicine Teaching [facility] Discharge Summary  Patient name: Jacob Ho Medical record number: [id number] Date of birth: 11/03/1975 Age: 43 y.o. Gender: male Date of Admission: 09/08/2018  Date of Discharge: 09/09/2018 Admitting Physician: Latrelle Dodrill, MD  Primary Care Provider: System, Provider Not In Consultants: None  Indication for Hospitalization: ACS rule out  Discharge Diagnoses/Problem List:  Chest pain, resolved, ACS ruled out Right tibial fracture s/p orthopedic fixation Asthma  Disposition: Home with home health PT  Discharge Condition: Stable  Discharge Exam: General: Alert, NAD, sitting up comfortably, laughing/talking on the phone HEENT: NCAT, MMM Cardiac: RRR no m/g/r, tenderness to palpation intercostally on the left chest Lungs: Clear bilaterally, no increased WOB  Abdomen: soft, non-tender, non-distended, normoactive BS Msk: Moves all extremities spontaneously, right knee in brace, neurovascularly intact to bilateral lower extremity Ext: Warm, dry, 2+ distal pulses, no edema   Brief Hospital Course:  Jacob Ho is a 43 year old gentleman, who was recently discharged on 4/21 after undergoing orthopedic fixation for a right tibial fracture, that presented with new onset left-sided chest pain.  Heart score 3. EKG sinus without ischemic changes x2.  Troponin trended flat 0.04 >0.06 >0.05.  D-dimer elevated at 3.17 in the setting of recent surgery, thus CTA was obtained which showed no evidence of PE or acute cardiopulmonary disease.  At discharge, he was hemodynamically stable and no longer had any remaining chest pain.  Believe this was likely MSK in nature as patient describes the pain as "spasm like" in nature traveling from his left leg/arm to his chest and additionally tender to palpation on his anterior chest.  Due to financial difficulties, his Lovenox, oxycodone, and Robaxin prescriptions (for recent surgery) sent by orthopedics were  ordered through [facility] pharmacy and taken care of by care management.   Issues for Follow Up:  1. Continue to monitor for recurrence of chest pain. 2. Ensure follow-up with orthopedics for his recent surgical fixation and ensure he continues to take Lovenox daily for the next month. 3. LDL 152, however 10 year ASCVD risk score 4%.  Statin was not started however can consider in the future if indicated.  Significant Procedures: None  Significant Labs and Imaging:  Recent Labs  Lab 09/06/18 0313 09/08/18 1839 09/09/18 0305  WBC 11.0* 14.9* 10.4  HGB 11.5* 14.3 14.4  HCT 34.5* 42.7 42.8  PLT 254 380 373   Recent Labs  Lab 09/04/18 0045 09/06/18 0313 09/08/18 0606 09/08/18 1839 09/09/18 0305 09/09/18 0858  NA 140 138  --  139  --  138  K 3.8 4.3  --  5.3*  --  4.2  CL 107 105  --  104  --  106  CO2 28 25  --  24  --  20*  GLUCOSE 94 108*  --  86  --  125*  BUN 12 15  --  24*  --  24*  CREATININE 1.11 1.12 1.07 1.34* 1.21 1.13  CALCIUM 8.3* 8.5*  --  9.5  --  8.5*     Results/Tests Pending at Time of Discharge: None   Discharge Medications:  Allergies as of 09/09/2018      Reactions   Iodine Anaphylaxis   UNSPECIFIED REACTION    Shellfish Allergy Anaphylaxis      Medication List    TAKE these medications   ascorbic acid 1000 MG tablet Commonly known as:  VITAMIN C Take 1 tablet (1,000 mg total) by mouth daily.   docusate sodium 100  MG capsule Commonly known as:  COLACE Take 1 capsule (100 mg total) by mouth 2 (two) times daily.   enoxaparin 40 MG/0.4ML injection Commonly known as:  LOVENOX Inject 0.4 mLs (40 mg total) into the skin daily.   HYDROcodone-acetaminophen 10-325 MG tablet Commonly known as:  NORCO Take 1 tablet by mouth every 6 (six) hours as needed for moderate pain or severe pain. What changed:  how much to take   methocarbamol 750 MG tablet Commonly known as:  ROBAXIN Take 1 tablet (750 mg total) by mouth every 8 (eight) hours as needed for  muscle spasms.   mometasone-formoterol 200-5 MCG/ACT Aero Commonly known as:  DULERA Inhale 2 puffs into the lungs 2 (two) times daily.   Vitamin D 125 MCG (5000 UT) Caps Take 1 capsule by mouth daily.       Discharge Instructions: Please refer to Patient Instructions section of EMR for full details.  Patient was counseled important signs and symptoms that should prompt return to medical care, changes in medications, dietary instructions, activity restrictions, and follow up appointments.   Follow-Up Appointments: Follow-up Information    [facility].   Contact information: [address] [place] [id number] [phone]       Myrene GalasHandy, Alistair, MD. Schedule an appointment as soon as possible for a visit in 9 day(s).   Specialty:  Orthopedic Surgery Why:  Please see d/c paperwork following orthopedic surgery.  Contact information: [address] [place] [address] [phone]           Allayne StackBeard, Samantha N, DO 09/09/2018, 1:30 PM PGY-1, [facility]

## 2018-09-09 NOTE — Progress Notes (Signed)
CM talked to [facility]; he wants patient to have HHPT at discharge; CM talked to patient, he is in agreement; HHC for charity if [facility]; T[facility] with [facility] called and has accepted [facility] for [facility] services. Alexis Goodell [phone]  [facility]  [phone]   Add [facility] to my Favorites Quality of Patient Care Rating3 out of 5 stars Patient Survey Summary Rating4 out of 5 stars  [facility]  [phone]   Add [facility] to my Favorites Quality of Patient Care Rating4  out of 5 stars Patient Survey Summary Rating4 out of 5 stars  [facility] [place]  [phone]   Add [facility] to my Favorites Quality of Patient Care Rating4  out of 5 stars Patient Survey Summary Rating4 out of 5 stars  [facility] [place]  [phone]   Add [facility] [place] to my Favorites Quality of Patient Care Rating4 out of 5 stars Patient Survey Summary Rating4 out of 5 stars  [facility] [place]  [phone]   Add [facility] [place] to my Favorites Quality of Patient Care Rating3  out of 5 stars Patient Survey Summary Rating3 out of 5 stars  [facility]  [phone]   Add [facility] to my Favorites Quality of Patient Care Rating3 out of 5 stars Patient Survey Summary Rating3 out of 5 stars  [facility]  [phone]   Add [facility] to my Favorites Quality of Patient Care Rating4 out of 5 stars Not Available11  [facility]  [phone]   Add [facility] to my Favorites Quality of Patient Care Rating3 out of 5 stars Patient Survey Summary Rating2 out of 5 stars  [facility]  [phone]   Add [facility] to my Favorites Quality of Patient Care Rating3  out of 5 stars Patient Survey Summary Rating4 out of 5 stars  [facility]   [phone]   Add [facility] to my Favorites Quality of Patient Care Rating3  out of 5 stars Patient Survey Summary Rating4 out of 5 stars  [facility]  [phone]

## 2018-09-09 NOTE — Discharge Instructions (Signed)
Thank you so much for allowing [place] to be a part of your care while at [facility].  You were admitted for further work-up due to new onset chest pains.  Fortunately while you were here this chest pain resolved.  We followed labs, EKG, and got CT imaging of your lungs all of which did not show any concerning findings for a heart or lung etiology.  We believe your spasm-like chest pains and soreness on your chest is likely muscular.   We recommend you continue to be nonweightbearing per your orthopedics recommendations.  Additionally, we have sent all of your medications to our pharmacy within the hospital and through care management the cost will be covered.  Please make sure you take your Lovenox daily to prevent clotting.  Please make sure you follow-up with your orthopedic team and community health and wellness for a hospital follow-up.

## 2018-09-09 NOTE — Progress Notes (Signed)
The 10-year ASCVD risk score Denman George DC Montez Hageman., et al., 2013) is: 4%   Values used to calculate the score:     Age: 43 years     Sex: Male     Is Non-Hispanic African American: Yes     Diabetic: No     Tobacco smoker: No     Systolic Blood Pressure: 128 mmHg     Is BP treated: No     HDL Cholesterol: 50 mg/dL     Total Cholesterol: 213 mg/dL

## 2018-09-09 NOTE — TOC Initial Note (Addendum)
Transition of Care [facility]) - Initial/Assessment Note    Patient Details  Name: Jacob Ho MRN: [id number] Date of Birth: August 26, 1975  Transition of Care [facility]) CM/SW Contact:    Reola Mosher Phone Number: [phone] 09/09/2018, 8:31 AM  Clinical Narrative:                 09/09/2018 - CM following for progression of care; pt to follow up at the [facility] at discharge; At discharge please send scripts to [facility] pharmacy; B Shagun Wordell RN,MHA,BSN 11:09 am- HHC arranged with [facility] for HHPT and [facility] ( Medication Assistance through [facility]) will be assisting pt with a month supply of medication; He will follow [facility] at [facility]; B Shelba Flake  09/08/2018 - Case manager received call that patient would be better with a rolling walker. CM contacted Zack Blank with [employer] to request walker. Patient will discharge with deputy to magistrate office and then will go home. No further CM services needed.  Mena Pauls RN CM  Clinical Narrative: 08/31/18  43 yr old male admitted with right tibial plateau fracture. 09/01/18 external fixator was placed. Depending on swelling,patient may  return to OR on Friday, 09/04/18 for ORIF of right tibial plateau fracture.   Patient is in police custody at this time and will discharge to jail when medically ready. CM will contact Nurse at [facility] concerning sending DME with patient. CM is not able to schedule CHW appointment   CM will continue to follow.Mena Pauls RN CM    Expected Discharge Plan: Home/Self Care Barriers to Discharge: No Barriers Identified   Patient Goals and CMS Choice     Choice offered to / list presented to : NA  Expected Discharge Plan and Services Expected Discharge Plan: Home/Self Care   Discharge Planning Services: CM Consult   Living arrangements for the past 2 months: Apartment                 DME Arranged: N/A DME Agency: NA HH Arranged: NA HH Agency: NA  Prior Living Arrangements/Services Living arrangements for  the past 2 months: Apartment Lives with:: Significant Other          Need for Family Participation in Patient Care: No (Comment) Care giver support system in place?: Yes (comment)      Activities of Daily Living Home Assistive Devices/Equipment: Brace (specify type)(leg brace) ADL Screening (condition at time of admission) Patient's cognitive ability adequate to safely complete daily activities?: Yes Is the patient deaf or have difficulty hearing?: No Does the patient have difficulty seeing, even when wearing glasses/contacts?: No Does the patient have difficulty concentrating, remembering, or making decisions?: No Patient able to express need for assistance with ADLs?: Yes Does the patient have difficulty dressing or bathing?: No Independently performs ADLs?: Yes (appropriate for developmental age) Does the patient have difficulty walking or climbing stairs?: Yes Weakness of Legs: Right Weakness of Arms/Hands: None  Permission Sought/Granted Permission sought to share information with : Case Manager                Emotional Assessment              Admission diagnosis:  Elevated troponin [R79.89] Chest pain, unspecified type [R07.9] Patient Active Problem List   Diagnosis Date Noted  . Chest pain 09/09/2018  . Vitamin D deficiency 09/06/2018  . Tibial plateau fracture 08/31/2018  . Asthma 04/11/2014  . Asthma exacerbation 02/16/2014   PCP:  System, Provider Not In Pharmacy:  [place], [address] [place] [address] Phone: [phone] Fax: [phone]     Social Determinants of Health (SDOH) Interventions    Readmission Risk Interventions No flowsheet data found.

## 2018-09-09 NOTE — Evaluation (Signed)
Physical Therapy Evaluation Patient Details Name: Jacob Ho MRN: [id number] DOB: 21-May-1975 Today's Date: 09/09/2018   History of Present Illness  43 y.o. male presenting with chest pain. PMH is significant for  has a past medical history of Asthma and Vitamin D deficiency, recent orthopedic fixation of a right tibial fracture.   Clinical Impression  Pt discharged from hospital yesterday and is at same level of mobility as he was yesterday. Pt d/cing to girlfriends home with 3 steps to enter. Pt inquired about being crutch trained, however after ambulation pt agreed that at this point RW offers the support and stability he needs. Pt given HEP of ROM exercises to perform until able to weight bear through R LE when it would be more appropriate to resume PT services to work on gait. Plans for d/c this afternoon. If not PT will continue to follow acutely.  Access Code: [email or web address]  URL: [email or web address]  Date: 09/09/2018  Prepared by: Verna Czech Fleet   Exercises  Supine Ankle Dorsiflexion and Plantarflexion AROM - 20 reps - 3x daily - 7x weekly  Supine Quadricep Sets - 10 reps - 5 hold - 3x daily - 7x weekly  Seated Long Arc Quad - 10 reps - 5 hold - 3x daily - 7x weekly  Supine Hip Abduction AROM - 10 reps - 3x daily - 7x weekly  Supine Short Arc Quad - 10 reps - 5 hold - 3x daily - 7x weekly  Supine Heel Slide - 10 reps - 3x daily - 7x weekly        Follow Up Recommendations Follow surgeon's recommendation for DC plan and follow-up therapies    Equipment Recommendations  None recommended by PT    Recommendations for Other Services       Precautions / Restrictions Precautions Precautions: Fall Required Braces or Orthoses: Other Brace(Bledsoe Brace locked in extension ) Restrictions Weight Bearing Restrictions: Yes RLE Weight Bearing: Non weight bearing      Mobility  Bed Mobility Overal bed mobility: Modified Independent Bed Mobility: Supine to  Sit           General bed mobility comments: increased effort and time vc for use of hip flexors instead of hands to move R LE to EoB  Transfers Overall transfer level: Needs assistance Equipment used: Rolling walker (2 wheeled) Transfers: Sit to/from Stand Sit to Stand: Supervision         General transfer comment: Cues for hand placement.  Ambulation/Gait Ambulation/Gait assistance: Supervision Gait Distance (Feet): 100 Feet Assistive device: Rolling walker (2 wheeled) Gait Pattern/deviations: Step-to pattern Gait velocity: decreased Gait velocity interpretation: <1.8 ft/sec, indicate of risk for recurrent falls General Gait Details: Hop-to pattern to ambulate <>stairs for training  Stairs Stairs: Yes Stairs assistance: Min assist Stair Management: No rails Number of Stairs: 2 General stair comments: cues for RW use and movement up stair        Balance Overall balance assessment: Needs assistance Sitting-balance support: Feet supported Sitting balance-Leahy Scale: Good     Standing balance support: Bilateral upper extremity supported Standing balance-Leahy Scale: Poor Standing balance comment: requires UE support                             Pertinent Vitals/Pain Pain Assessment: 0-10 Pain Score: 8  Pain Location: RLE when standing Pain Descriptors / Indicators: Sharp;Grimacing;Aching Pain Intervention(s): Limited activity within patient's tolerance;Monitored during session;Repositioned    Home Living Family/patient expects to be discharged  to:: Private residence Living Arrangements: Spouse/significant other Available Help at Discharge: Family;Available 24 hours/day Type of Home: House Home Access: Stairs to enter   [employer] Corporation[employer]trance Stairs-Number of Steps: 3 Home Layout: Multi-level Home Equipment: Walker - 2 wheels      Prior Function Level of Independence: Independent                  Extremity/Trunk Assessment   Upper Extremity  Assessment Upper Extremity Assessment: Overall WFL for tasks assessed    Lower Extremity Assessment Lower Extremity Assessment: RLE deficits/detail RLE Deficits / Details: s/p R tibial ORIF; hip flex strong although limited by pain       Communication   Communication: No difficulties  Cognition Arousal/Alertness: Awake/alert Behavior During Therapy: WFL for tasks assessed/performed Overall Cognitive Status: Within Functional Limits for tasks assessed                                        General Comments General comments (skin integrity, edema, etc.): Discussed at great length why RW is superior to crutches given pt current stability with increased weight of R LE with Bledsoe brace    Exercises General Exercises - Lower Extremity Ankle Circles/Pumps: AROM;Right;5 reps;Seated Quad Sets: AROM;Right;5 reps;Seated Gluteal Sets: AROM;Right;5 reps;Seated Short Arc Quad: AROM;Right;5 reps;Seated Long Arc Quad: AROM;Right;5 reps;Seated Heel Slides: AROM;Right;5 reps;Seated Hip ABduction/ADduction: AROM;Right;5 reps;Seated   Assessment/Plan    PT Assessment Patient needs continued PT services  PT Problem List Decreased strength;Decreased range of motion;Decreased activity tolerance;Decreased balance;Decreased mobility;Pain;Decreased knowledge of use of DME;Decreased knowledge of precautions       PT Treatment Interventions DME instruction;Gait training;Stair training;Functional mobility training;Therapeutic activities;Therapeutic exercise;Balance training;Patient/family education    PT Goals (Current goals can be found in the Care Plan section)  Acute Rehab PT Goals Patient Stated Goal:  less pain PT Goal Formulation: With patient Time For Goal Achievement: 09/23/18 Potential to Achieve Goals: Good    Frequency Min 5X/week    AM-PAC PT "6 Clicks" Mobility  Outcome Measure Help needed turning from your back to your side while in a flat bed without using  bedrails?: None Help needed moving from lying on your back to sitting on the side of a flat bed without using bedrails?: None Help needed moving to and from a bed to a chair (including a wheelchair)?: None Help needed standing up from a chair using your arms (e.g., wheelchair or bedside chair)?: None Help needed to walk in hospital room?: A Little Help needed climbing 3-5 steps with a railing? : A Little 6 Click Score: 22    End of Session Equipment Utilized During Treatment: Gait belt Activity Tolerance: Patient limited by pain Patient left: in chair;with call bell/phone within reach;with chair alarm set Nurse Communication: Mobility status PT Visit Diagnosis: Other abnormalities of gait and mobility (R26.89)    Time: [id number] PT Time Calculation (min) (ACUTE ONLY): 34 min   Charges:   PT Evaluation $PT Eval Low Complexity: 1 Low PT Treatments $Gait Training: 8-22 mins        Ola Fawver B. Beverely RisenVan Fleet PT, DPT Acute Rehabilitation Services Pager [phone] Office [phone]   Elon Alaslizabeth B Van Fleet 09/09/2018, 12:42 PM

## 2018-09-09 NOTE — ED Notes (Signed)
ED TO INPATIENT HANDOFF REPORT  ED Nurse Name and Phone #: Tray SwazilandJordan, [phone]  S Name/Age/Gender Jacob Ho 43 y.o. male Room/Bed: 025C/025C  Code Status   Code Status: Prior  Home/SNF/Other Home Patient oriented to: self, place, time and situation Is this baseline? Yes   Triage Complete: Triage complete  Chief Complaint chest pain  Triage Note Pt d/c from here today from leg surgery. 30 mins ago sudden onset L sided sharp chest pain. Endorses diaphoresis, lightheadedness.    Allergies Allergies  Allergen Reactions  . Iodine Anaphylaxis    UNSPECIFIED REACTION   . Shellfish Allergy Anaphylaxis    Level of Care/Admitting Diagnosis ED Disposition    ED Disposition Condition Comment   Admit  [name] [[id number][facility]  Level of Care: Telemetry Cardiac [103]  Covid Evaluation: N/A  Diagnosis: Chest pain [[id number]][[id number]]  Admitting Physician: Latrelle DodrillMCINTYRE, BRITTANY J [[id number]]  Attending Physician: Latrelle DodrillMCINTYRE, BRITTANY J [[id number]]  PT Class (Do Not Modify): Observation [104]  PT Acc Code (Do Not Modify): Observation [10022]       B Medical/Surgery History Past Medical History:  Diagnosis Date  . Asthma   . Vitamin D deficiency 09/06/2018   Past Surgical History:  Procedure Laterality Date  . EXTERNAL FIXATION LEG Right 09/01/2018   Procedure: EXTERNAL FIXATION LEG;  Surgeon: Myrene GalasHandy, Rayvion, MD;  Location: [facility] OR;  Service: Orthopedics;  Laterality: Right;  . EXTERNAL FIXATION REMOVAL Right 09/04/2018   Procedure: REMOVAL EXTERNAL FIXATION LEG;  Surgeon: Myrene GalasHandy, Keyonte, MD;  [facility];  Service: Orthopedics;  Laterality: Right;  . FINE NEEDLE ASPIRATION Right 09/01/2018   Procedure: Fine Needle Aspiration;  Surgeon: Myrene GalasHandy, Yohann, MD;  [facility];  Service: Orthopedics;  Laterality: Right;  . ORIF TIBIA PLATEAU Right 09/04/2018   Procedure: OPEN REDUCTION INTERNAL FIXATION (ORIF) TIBIAL PLATEAU;  Surgeon: Myrene GalasHandy, Thorsten, MD;  Location:  [facility] OR;  Service: Orthopedics;  Laterality: Right;     A IV Location/Drains/Wounds Patient Lines/Drains/Airways Status   Active Line/Drains/Airways    Name:   Placement date:   Placement time:   Site:   Days:   Peripheral IV 09/08/18 Right Antecubital   09/08/18    1836    Antecubital   1   Incision (Closed) 09/01/18 Leg Right   09/01/18    1528     8   Incision (Closed) 09/01/18 Knee Right   09/01/18    1544     8   Incision (Closed) 09/04/18 Leg Right   09/04/18    1651     5          Intake/Output Last 24 hours No intake or output data in the 24 hours ending 09/09/18 0143  Labs/Imaging Results for orders placed or performed during the hospital encounter of 09/08/18 (from the past 48 hour(s))  Basic metabolic panel     Status: Abnormal   Collection Time: 09/08/18  6:39 PM  Result Value Ref Range   Sodium 139 135 - 145 mmol/L   Potassium 5.3 (H) 3.5 - 5.1 mmol/L   Chloride 104 98 - 111 mmol/L   CO2 24 22 - 32 mmol/L   Glucose, Bld 86 70 - 99 mg/dL   BUN 24 (H) 6 - 20 mg/dL   Creatinine, Ser 4.781.34 (H) 0.61 - 1.24 mg/dL   Calcium 9.5 8.9 - 29.510.3 mg/dL   GFR calc non Af Amer >60 >60 mL/min   GFR calc Af Amer >60 >60 mL/min   Anion  gap 11 5 - 15    Comment: Performed at [facility] Lab, [address], [place], [place] [address]  CBC with Differential     Status: Abnormal   Collection Time: 09/08/18  6:39 PM  Result Value Ref Range   WBC 14.9 (H) 4.0 - 10.5 K/uL   RBC 4.84 4.22 - 5.81 MIL/uL   Hemoglobin 14.3 13.0 - 17.0 g/dL   HCT 60.4 54.0 - 98.1 %   MCV 88.2 80.0 - 100.0 fL   MCH 29.5 26.0 - 34.0 pg   MCHC 33.5 30.0 - 36.0 g/dL   RDW 19.1 47.8 - 29.5 %   Platelets 380 150 - 400 K/uL   nRBC 0.0 0.0 - 0.2 %   Neutrophils Relative % 78 %   Neutro Abs 11.6 (H) 1.7 - 7.7 K/uL   Lymphocytes Relative 10 %   Lymphs Abs 1.5 0.7 - 4.0 K/uL   Monocytes Relative 9 %   Monocytes Absolute 1.3 (H) 0.1 - 1.0 K/uL   Eosinophils Relative 2 %   Eosinophils Absolute 0.2 0.0 - 0.5  K/uL   Basophils Relative 0 %   Basophils Absolute 0.0 0.0 - 0.1 K/uL   Immature Granulocytes 1 %   Abs Immature Granulocytes 0.21 (H) 0.00 - 0.07 K/uL    Comment: Performed at [facility], [address], [place], [place] [address]  Troponin I - ONCE - STAT     Status: Abnormal   Collection Time: 09/08/18  6:39 PM  Result Value Ref Range   Troponin I 0.04 (HH) <0.03 ng/mL    Comment: CRITICAL RESULT CALLED TO, READ BACK BY AND VERIFIED WITHCherlyn Labella RN AT 1928 ON [id number] BY Marveen Reeks Performed at [facility], [address] [place], [place] [address]   D-dimer, quantitative     Status: Abnormal   Collection Time: 09/08/18  6:39 PM  Result Value Ref Range   D-Dimer, Quant 3.17 (H) 0.00 - 0.50 ug/mL-FEU    Comment: (NOTE) At the manufacturer cut-off of 0.50 ug/mL FEU, this assay has been documented to exclude PE with a sensitivity and negative predictive value of 97 to 99%.  At this time, this assay has not been approved by the FDA to exclude DVT/VTE. Results should be correlated with clinical presentation. Performed at [facility] Lab, [address] [place], [place] [address]   Urine rapid drug screen (hosp performed)     Status: Abnormal   Collection Time: 09/08/18  7:41 PM  Result Value Ref Range   Opiates POSITIVE (A) NONE DETECTED   Cocaine NONE DETECTED NONE DETECTED   Benzodiazepines NONE DETECTED NONE DETECTED   Amphetamines NONE DETECTED NONE DETECTED   Tetrahydrocannabinol POSITIVE (A) NONE DETECTED   Barbiturates NONE DETECTED NONE DETECTED    Comment: (NOTE) DRUG SCREEN FOR MEDICAL PURPOSES ONLY.  IF CONFIRMATION IS NEEDED FOR ANY PURPOSE, NOTIFY LAB WITHIN 5 DAYS. LOWEST DETECTABLE LIMITS FOR URINE DRUG SCREEN Drug Class                     Cutoff (ng/mL) Amphetamine and metabolites    1000 Barbiturate and metabolites    200 Benzodiazepine                 200 Tricyclics and metabolites     300 Opiates and metabolites         300 Cocaine and metabolites        300 THC  50 Performed at [facility] Lab, [address], [place], [place] [address]   Urinalysis, Routine w reflex microscopic     Status: Abnormal   Collection Time: 09/08/18  7:41 PM  Result Value Ref Range   Color, Urine YELLOW YELLOW   APPearance CLEAR CLEAR   Specific Gravity, Urine 1.021 1.005 - 1.030   pH 5.0 5.0 - 8.0   Glucose, UA NEGATIVE NEGATIVE mg/dL   Hgb urine dipstick NEGATIVE NEGATIVE   Bilirubin Urine NEGATIVE NEGATIVE   Ketones, ur 5 (A) NEGATIVE mg/dL   Protein, ur NEGATIVE NEGATIVE mg/dL   Nitrite NEGATIVE NEGATIVE   Leukocytes,Ua NEGATIVE NEGATIVE    Comment: Performed at [facility] Lab, [address], [place], [place] [address]  Troponin I - Once-Timed     Status: Abnormal   Collection Time: 09/08/18  8:59 PM  Result Value Ref Range   Troponin I 0.06 (HH) <0.03 ng/mL    Comment: CRITICAL VALUE NOTED.  VALUE IS CONSISTENT WITH PREVIOUSLY REPORTED AND CALLED VALUE. Performed at [facility], [address], [place], [place] [address]   Troponin I - Once-Timed     Status: Abnormal   Collection Time: 09/08/18 11:54 PM  Result Value Ref Range   Troponin I 0.05 (HH) <0.03 ng/mL    Comment: CRITICAL VALUE NOTED.  VALUE IS CONSISTENT WITH PREVIOUSLY REPORTED AND CALLED VALUE. Performed at [facility] [place] [facility], [address], [place], [place] [address]    Ct Angio Chest Pe W And/or Wo Contrast  Result Date: 09/09/2018 CLINICAL DATA:  Left-sided chest pain.  Recent surgery. EXAM: CT ANGIOGRAPHY CHEST WITH CONTRAST TECHNIQUE: Multidetector CT imaging of the chest was performed using the standard protocol during bolus administration of intravenous contrast. Multiplanar CT image reconstructions and MIPs were obtained to evaluate the vascular anatomy. CONTRAST:  80mL OMNIPAQUE IOHEXOL 350 MG/ML SOLN COMPARISON:  None. FINDINGS: Cardiovascular: --Pulmonary arteries: Contrast injection is  sufficient to demonstrate satisfactory opacification of the pulmonary arteries to the segmental level, with attenuation of 450 HU at the main pulmonary artery. There is no pulmonary embolus. The main pulmonary artery is within normal limits for size. --Aorta: Limited opacification of the aorta due to bolus timing optimization for the pulmonary arteries. Conventional 3 vessel aortic branching pattern. The aortic course and caliber are normal. There is no aortic atherosclerosis. --Heart: Normal size. No pericardial effusion. Mediastinum/Nodes: No mediastinal, hilar or axillary lymphadenopathy. The visualized thyroid and thoracic esophageal course are unremarkable. Lungs/Pleura: No pulmonary nodules or masses. No pleural effusion or pneumothorax. No focal airspace consolidation. No focal pleural abnormality. Upper Abdomen: Contrast bolus timing is not optimized for evaluation of the abdominal organs. Within this limitation, the visualized organs of the upper abdomen are normal. Musculoskeletal: No chest wall abnormality. No acute or significant osseous findings. Review of the MIP images confirms the above findings. IMPRESSION: No pulmonary embolus or other acute thoracic abnormality. Electronically Signed   By: Deatra Robinson M.D.   On: 09/09/2018 00:51    Pending Labs Unresulted Labs (From admission, onward)    Start     Ordered   09/09/18 0121  Brain natriuretic peptide  Add-on,   R     09/09/18 0120          Vitals/Pain Today's Vitals   09/08/18 2145 09/08/18 2202 09/08/18 2215 09/08/18 2245  BP: (!) 114/94  116/65 (!) 154/92  Pulse: 91  97 (!) 102  Resp: (!) 31  (!) 22 18  Temp:  TempSrc:      SpO2: 98%  98% 99%  PainSc:  7       Isolation Precautions No active isolations  Medications Medications  morphine 4 MG/ML injection 4 mg (4 mg Intravenous Given 09/08/18 1839)  diphenhydrAMINE (BENADRYL) capsule 50 mg ( Oral See Alternative 09/08/18 2321)    Or  diphenhydrAMINE (BENADRYL)  injection 50 mg (50 mg Intravenous Given 09/08/18 2321)  hydrocortisone sodium succinate (SOLU-CORTEF) 100 MG injection 200 mg (200 mg Intravenous Given 09/08/18 2022)  aspirin chewable tablet 324 mg (324 mg Oral Given 09/08/18 2202)  morphine 4 MG/ML injection 4 mg (4 mg Intravenous Given 09/08/18 2225)  iohexol (OMNIPAQUE) 350 MG/ML injection 80 mL (80 mLs Intravenous Contrast Given 09/09/18 0035)    Mobility walks Moderate fall risk   Focused Assessments Cardiac Assessment Handoff:  Cardiac Rhythm: Sinus tachycardia Lab Results  Component Value Date   TROPONINI 0.05 (HH) 09/08/2018   Lab Results  Component Value Date   DDIMER 3.17 (H) 09/08/2018   Does the Patient currently have chest pain? No     R Recommendations: See Admitting Provider Note  Report given to:   Additional Notes:

## 2018-09-15 ENCOUNTER — Inpatient Hospital Stay: Payer: Self-pay | Admitting: Primary Care

## 2018-09-16 ENCOUNTER — Encounter (HOSPITAL_COMMUNITY): Payer: Self-pay

## 2018-09-16 ENCOUNTER — Emergency Department (HOSPITAL_BASED_OUTPATIENT_CLINIC_OR_DEPARTMENT_OTHER): Payer: Self-pay

## 2018-09-16 ENCOUNTER — Other Ambulatory Visit: Payer: Self-pay

## 2018-09-16 ENCOUNTER — Emergency Department (HOSPITAL_COMMUNITY)
Admission: EM | Admit: 2018-09-16 | Discharge: 2018-09-16 | Disposition: A | Discharge status: Home / Self Care | Payer: Self-pay | Attending: Emergency Medicine | Admitting: Emergency Medicine

## 2018-09-16 ENCOUNTER — Emergency Department (HOSPITAL_COMMUNITY): Payer: Self-pay

## 2018-09-16 DIAGNOSIS — A084 Viral intestinal infection, unspecified: Secondary | ICD-10-CM | POA: Insufficient documentation

## 2018-09-16 DIAGNOSIS — R112 Nausea with vomiting, unspecified: Secondary | ICD-10-CM | POA: Insufficient documentation

## 2018-09-16 DIAGNOSIS — M7121 Synovial cyst of popliteal space [Baker], right knee: Secondary | ICD-10-CM | POA: Insufficient documentation

## 2018-09-16 DIAGNOSIS — M25461 Effusion, right knee: Secondary | ICD-10-CM | POA: Insufficient documentation

## 2018-09-16 DIAGNOSIS — R1084 Generalized abdominal pain: Secondary | ICD-10-CM | POA: Insufficient documentation

## 2018-09-16 DIAGNOSIS — Z87891 Personal history of nicotine dependence: Secondary | ICD-10-CM | POA: Insufficient documentation

## 2018-09-16 DIAGNOSIS — Z79899 Other long term (current) drug therapy: Secondary | ICD-10-CM | POA: Insufficient documentation

## 2018-09-16 DIAGNOSIS — M79609 Pain in unspecified limb: Secondary | ICD-10-CM

## 2018-09-16 DIAGNOSIS — E876 Hypokalemia: Secondary | ICD-10-CM | POA: Insufficient documentation

## 2018-09-16 LAB — URINALYSIS, ROUTINE W REFLEX MICROSCOPIC
Bilirubin Urine: NEGATIVE
Glucose, UA: NEGATIVE mg/dL
Hgb urine dipstick: NEGATIVE
Ketones, ur: NEGATIVE mg/dL
Leukocytes,Ua: NEGATIVE
Nitrite: NEGATIVE
Protein, ur: NEGATIVE mg/dL
Specific Gravity, Urine: 1.03 (ref 1.005–1.030)
pH: 5 (ref 5.0–8.0)

## 2018-09-16 LAB — CBC WITH DIFFERENTIAL/PLATELET
Abs Immature Granulocytes: 0.14 10*3/uL — ABNORMAL HIGH (ref 0.00–0.07)
Basophils Absolute: 0 10*3/uL (ref 0.0–0.1)
Basophils Relative: 0 %
Eosinophils Absolute: 0.1 10*3/uL (ref 0.0–0.5)
Eosinophils Relative: 1 %
HCT: 42.5 % (ref 39.0–52.0)
Hemoglobin: 13.9 g/dL (ref 13.0–17.0)
Immature Granulocytes: 1 %
Lymphocytes Relative: 11 %
Lymphs Abs: 1.7 10*3/uL (ref 0.7–4.0)
MCH: 29 pg (ref 26.0–34.0)
MCHC: 32.7 g/dL (ref 30.0–36.0)
MCV: 88.7 fL (ref 80.0–100.0)
Monocytes Absolute: 0.8 10*3/uL (ref 0.1–1.0)
Monocytes Relative: 5 %
Neutro Abs: 13.3 10*3/uL — ABNORMAL HIGH (ref 1.7–7.7)
Neutrophils Relative %: 82 %
Platelets: 453 10*3/uL — ABNORMAL HIGH (ref 150–400)
RBC: 4.79 MIL/uL (ref 4.22–5.81)
RDW: 13.7 % (ref 11.5–15.5)
WBC: 16.1 10*3/uL — ABNORMAL HIGH (ref 4.0–10.5)
nRBC: 0 % (ref 0.0–0.2)

## 2018-09-16 LAB — COMPREHENSIVE METABOLIC PANEL
ALT: 19 U/L (ref 0–44)
AST: 13 U/L — ABNORMAL LOW (ref 15–41)
Albumin: 3.3 g/dL — ABNORMAL LOW (ref 3.5–5.0)
Alkaline Phosphatase: 76 U/L (ref 38–126)
Anion gap: 11 (ref 5–15)
BUN: 15 mg/dL (ref 6–20)
CO2: 24 mmol/L (ref 22–32)
Calcium: 8.8 mg/dL — ABNORMAL LOW (ref 8.9–10.3)
Chloride: 104 mmol/L (ref 98–111)
Creatinine, Ser: 1.04 mg/dL (ref 0.61–1.24)
GFR calc Af Amer: >60 mL/min (ref >60)
GFR calc non Af Amer: >60 mL/min (ref >60)
Glucose, Bld: 106 mg/dL — ABNORMAL HIGH (ref 70–99)
Potassium: 3.1 mmol/L — ABNORMAL LOW (ref 3.5–5.1)
Sodium: 139 mmol/L (ref 135–145)
Total Bilirubin: 0.8 mg/dL (ref 0.3–1.2)
Total Protein: 6.5 g/dL (ref 6.5–8.1)

## 2018-09-16 LAB — LIPASE, BLOOD: Lipase: 23 U/L (ref 11–51)

## 2018-09-16 LAB — LACTIC ACID, PLASMA
Lactic Acid, Venous: 0.9 mmol/L (ref 0.5–1.9)
Lactic Acid, Venous: 1.2 mmol/L (ref 0.5–1.9)

## 2018-09-16 MED ORDER — SODIUM CHLORIDE 0.9 % IV BOLUS
1000.0000 mL | Freq: Once | INTRAVENOUS | Status: AC
  Administered 2018-09-16: 1000 mL via INTRAVENOUS

## 2018-09-16 MED ORDER — ONDANSETRON 4 MG PO TBDP: 4.0000 mg | ORAL_TABLET | Freq: Three times a day (TID) | ORAL | 0 refills | Status: DC | PRN

## 2018-09-16 MED ORDER — HYDROMORPHONE HCL 1 MG/ML IJ SOLN
1.0000 mg | Freq: Once | INTRAMUSCULAR | Status: AC
  Administered 2018-09-16: 1 mg via INTRAVENOUS
  Filled 2018-09-16: qty 1

## 2018-09-16 MED ORDER — HYDROCODONE-ACETAMINOPHEN 5-325 MG PO TABS: 1.0000 | ORAL_TABLET | Freq: Four times a day (QID) | ORAL | 0 refills | Status: DC | PRN

## 2018-09-16 MED ORDER — POTASSIUM CHLORIDE CRYS ER 20 MEQ PO TBCR
40.0000 meq | EXTENDED_RELEASE_TABLET | Freq: Once | ORAL | Status: AC
  Administered 2018-09-16: 13:00:00 40 meq via ORAL
  Filled 2018-09-16: qty 2

## 2018-09-16 MED FILL — ONDANSETRON ODT 4 MG TABLET: 4 | 4 days supply | Qty: 12 | Fill #0

## 2018-09-16 NOTE — ED Provider Notes (Signed)
MSE was initiated and I personally evaluated the patient and placed orders (if any) at  7:09 AM on September 16, 2018.  The patient appears stable so that the remainder of the MSE may be completed by another provider.  43 year old male presenting by EMS from home with nausea, vomiting, and diarrhea.  He reports greater than 10 episodes of nonbloody, nonbilious vomiting since last night and 2-3 episodes of nonbloody "green" diarrhea.  He also reports increased pain and swelling to his right leg over the last 2 days.  He reports pain is worse over the bilateral right knee and distal right thigh.  EMS reports that the patient felt warm to the touch and had an oral temp of 99.7 he was given fentanyl, Zofran, and a 500 cc fluid bolus but no antipyretics.  The patient reports that his significant other checked his temperature at home earlier in the night and it was <100 F.  He denies any antipyretics at home prior to calling EMS.  He reports that he has been out of his home pain medication for the last 2 days.  He was supposed to follow-up with Dr. Carola Frost this morning in the office.  He denies chills, shortness of breath, chest pain, cough, urinary symptoms, constipation, numbness, weakness, rash, lower extremity edema, dizziness, headache, or lightheadedness.  He underwent ORIF for tibial plateau fracture on 4/14.  He is placed in a knee immobilizer and has been compliant with home Lovenox injections.  He then returned to the ER on 4/21 for chest pain.  He had a CTA which was negative.  Chest pain was thought to be musculoskeletal in nature as he describes of his pain as a spasm-like.  On physical examination, patient is in no acute distress. Capillary refill is less than 2 seconds.  Heart is regular rate and rhythm without murmurs rubs or gallops.  Lungs are clear to auscultation bilaterally.  Mild diffuse tenderness throughout the abdomen without rebound or guarding.  Hyperactive bowel sounds in all 4 quadrants.   No CVA tenderness bilaterally.  The right knee is markedly swollen when compared to the left knee.  He has no tenderness of the right ankle or hip.  DP and PT pulses are 2+ and symmetric.  Sensation is intact and equal throughout.  TED hose is in place to the left lower extremity.  Knee immobilizer is in place to the right lower extremity.  Range of motion is deferred to the right lower extremity at this time.  There is no overlying erythema to the joint, but he is markedly tender to palpation to the bilateral aspects of the knee.  No lower extremity edema bilaterally.  Distal right thigh appears mildly more swollen when compared to the left.  Will order basic labs and pain control for the medication. I am concerned about the significant amount of swelling to the right knee.  He reports compliance with home Lovenox, but there is concern for thanks DVT.  He has good pulses and perfusion so exam is less concerning for compartment syndrome.  Lower suspicion for septic joint or gout.Patient care transferred to [facility] at the end of my shift. Patient presentation, ED course, and plan of care discussed with review of all pertinent labs and imaging. Please see his/her note for further details regarding further ED course and disposition.    Barkley Boards, PA-C 09/16/18 8182    Shon Baton, MD 09/16/18 [phone]

## 2018-09-16 NOTE — Discharge Instructions (Addendum)
You have been diagnosed today with viral gastroenteritis, low potassium, knee effusion and Baker's cyst of the right knee.  At this time there does not appear to be the presence of an emergent medical condition, however there is always the potential for conditions to change. Please read and follow the below instructions.  Please return to the Emergency Department immediately for any new or worsening symptoms or if your symptoms do not improve within 2 days. Please be sure to follow up with your Primary Care Provider within one week regarding your visit today; please call their office to schedule an appointment even if you are feeling better for a follow-up visit. You may use the pain medication Norco as prescribed for severe pain.  Do not drive or operate machinery while taking Norco as will make you drowsy.  Do not take with other sedating medications or with alcohol as this will worsen side effects. Use the nausea medication Zofran as prescribed to help with your symptoms.  Please be sure to drink plenty water and get plenty of rest to help with your symptoms.  Return to the emergency department immediately if you have vomiting that is not controlled with the medicine or if you have any other new or worsening symptoms or if your belly pain returns. Please be sure to follow-up with your orthopedic surgeon Dr. Carola Frost regarding your right knee pain.  Call his office today to schedule follow-up appointment for next week.  Use rest ice and elevation to help with your pain and swelling.  Additionally your ultrasound showed a possible Baker's cyst in your right knee be sure to discuss this with your orthopedic specialist. Additionally your potassium was low today, you have been given a potassium supplement here in the emergency department.  Please read the attached packet regarding low potassium, this should improve once you begin eating foods regularly again, discuss this with your primary care provider.  Get  help right away if: You have chest pain. You have fever or chills You have belly pain in one certain spot like your right lower belly. You feel very weak or you pass out (faint). You see blood in your throw-up. Your throw-up looks like coffee grounds. You have bloody or black poop (stools) or poop that look like tar. You have a very bad headache, a stiff neck, or both. You have a rash. You have very bad pain, cramping, or bloating in your belly (abdomen). You have trouble breathing. You are breathing very quickly. Your heart is beating very quickly. Your skin feels cold and clammy. You feel confused. You have pain when you pee. You have signs of dehydration, such as: Dark pee, hardly any pee, or no pee. Cracked lips. Dry mouth. Sunken eyes. Sleepiness. Weakness. Get help right away if you: Have swelling or redness of your knee that gets worse or does not get better. Have serious pain in your knee. Have a fever. Any new/concerning or worsening symptoms.  Please read the additional information packets attached to your discharge summary.  Do not take your medicine if  develop an itchy rash, swelling in your mouth or lips, or difficulty breathing.

## 2018-09-16 NOTE — ED Notes (Signed)
Pt talkative and restful.  IV flushes well, with good blood return for lab draw.  No current concerns.

## 2018-09-16 NOTE — Progress Notes (Signed)
Patient ID: Jacob Ho, male   DOB: [birth year], 43 y.o.   MRN: [id number]   LOS: 0 days   Subjective: Arol comes in 12d s/p ORIF right tibia plateau with N/V, diarrhea, and increased knee pain and swelling for the last 2d. He was due to see Dr. Carola Frost today but came to ED instead.   Objective: Vital signs in last 24 hours: Temp:  [97.9 F (36.6 C)] 97.9 F (36.6 C) (04/29 0829) Pulse Rate:  [62-78] 78 (04/29 1100) BP: (119-135)/(68-109) 119/68 (04/29 1100) SpO2:  [95 %-100 %] 95 % (04/29 1100) Weight:  [83.9 kg] 83.9 kg (04/29 0708)     Laboratory  CBC Recent Labs    09/16/18 0645  WBC 16.1*  HGB 13.9  HCT 42.5  PLT 453*   BMET Recent Labs    09/16/18 0645  NA 139  K 3.1*  CL 104  CO2 24  GLUCOSE 106*  BUN 15  CREATININE 1.04  CALCIUM 8.8*     Physical Exam General appearance: alert and no distress  RLE: KI in place. Incisions C/D/I. No erythema or warmth. PROM ~30 degrees with minimal pain. Mod effusion.   Assessment/Plan: Knee pain -- Doubt septic joint given movement. Advised him to keep leg elevated and stop smoking. He should call Dr. Magdalene Patricia office to secure more pain medication and reschedule his appt to next week. He should return to ED should knee pain continue to increase for aspiration.    Freeman Caldron, PA-C Orthopedic Surgery [phone] 09/16/2018

## 2018-09-16 NOTE — ED Provider Notes (Signed)
[facility] EMERGENCY DEPARTMENT Provider Note   CSN: [id number] Arrival date & time: 09/16/18  1308    History   Chief Complaint Chief Complaint  Patient presents with   Nausea    HPI Jacob Ho is a 43 y.o. male.  Initial medical screening examination completed and lab work ordered by Frederik Pear PA-C.   43 year old male with history of asthma presenting today for multiple concerns.  Initial concern is multiple episodes of nonbloody nonbilious vomiting as well as nonbloody diarrhea.  Endorses mild abdominal pain associated with his emesis and diarrhea.  Additionally endorses right knee pain x2 days constant moderate in intensity on bilateral sides of her right knee worsened with movement and palpation and without alleviating factors.  He underwent ORIF of the tibial plateau fracture on 09/01/2018 and has been in immobilizer ever since.  Patient reports that he has been using Lovenox injections at home and has not missed any doses.  Patient denies chest pain, shortness of breath or any additional concerns today. HPI  Past Medical History:  Diagnosis Date   Asthma    Vitamin D deficiency 09/06/2018    Patient Active Problem List   Diagnosis Date Noted   Chest pain 09/09/2018   Vitamin D deficiency 09/06/2018   Tibial plateau fracture 08/31/2018   Asthma 04/11/2014   Asthma exacerbation 02/16/2014    Past Surgical History:  Procedure Laterality Date   EXTERNAL FIXATION LEG Right 09/01/2018   Procedure: EXTERNAL FIXATION LEG;  Surgeon: Myrene Galas, MD;  Location: [facility] OR;  Service: Orthopedics;  Laterality: Right;   EXTERNAL FIXATION REMOVAL Right 09/04/2018   Procedure: REMOVAL EXTERNAL FIXATION LEG;  Surgeon: Myrene Galas, MD;  Location: [facility] OR;  Service: Orthopedics;  Laterality: Right;   FINE NEEDLE ASPIRATION Right 09/01/2018   Procedure: Fine Needle Aspiration;  Surgeon: Myrene Galas, MD;  Location: [facility] OR;  Service: Orthopedics;   Laterality: Right;   ORIF TIBIA PLATEAU Right 09/04/2018   Procedure: OPEN REDUCTION INTERNAL FIXATION (ORIF) TIBIAL PLATEAU;  Surgeon: Myrene Galas, MD;  Location: [facility] OR;  Service: Orthopedics;  Laterality: Right;        Home Medications    Prior to Admission medications   Medication Sig Start Date End Date Taking? Authorizing Provider  Cholecalciferol (VITAMIN D) 125 MCG (5000 UT) CAPS Take 1 capsule by mouth daily. 09/09/18   Allayne Stack, DO  docusate sodium (COLACE) 100 MG capsule Take 1 capsule (100 mg total) by mouth 2 (two) times daily. 09/08/18   Montez Morita, PA-C  enoxaparin (LOVENOX) 40 MG/0.4ML injection Inject 0.4 mLs (40 mg total) into the skin daily. 09/09/18   Allayne Stack, DO  HYDROcodone-acetaminophen (NORCO/VICODIN) 5-325 MG tablet Take 1-2 tablets by mouth every 6 (six) hours as needed. 09/16/18   Harlene Salts A, PA-C  methocarbamol (ROBAXIN) 750 MG tablet Take 1 tablet (750 mg total) by mouth every 8 (eight) hours as needed for muscle spasms. 09/09/18   Allayne Stack, DO  mometasone-formoterol (DULERA) 200-5 MCG/ACT AERO Inhale 2 puffs into the lungs 2 (two) times daily. 07/09/17   Hedges, Tinnie [name], PA-C  ondansetron (ZOFRAN ODT) 4 MG disintegrating tablet Take 1 tablet (4 mg total) by mouth every 8 (eight) hours as needed for nausea or vomiting. 09/16/18   Harlene Salts A, PA-C  vitamin C (VITAMIN C) 1000 MG tablet Take 1 tablet (1,000 mg total) by mouth daily. 09/09/18   Montez Morita, PA-C    Family History Family History  Problem Relation  Age of Onset   Diabetes Mother    Asthma Mother    Heart attack Father    Asthma Sister     Social History Social History   Tobacco Use   Smoking status: Former Smoker    Start date: 05/21/1991    Last attempt to quit: 09/09/2013    Years since quitting: 5.0   Smokeless tobacco: Never Used   Tobacco comment: smoking black and milds 1 a day  Substance Use Topics   Alcohol use: No   Drug use: Yes     Comment: marijuana occasionally     Allergies   Iodine and Shellfish allergy   Review of Systems Review of Systems  Constitutional: Negative.  Negative for chills and fever.  Respiratory: Negative.  Negative for cough and shortness of breath.   Cardiovascular: Negative.  Negative for chest pain.  Gastrointestinal: Positive for abdominal pain (Mild generalized), diarrhea, nausea and vomiting. Negative for blood in stool.  Musculoskeletal: Positive for arthralgias (Right knee pain) and joint swelling (Right knee swelling).  Neurological: Negative.  Negative for syncope, weakness, numbness and headaches.  All other systems reviewed and are negative.  Physical Exam Updated Vital Signs BP 129/82 (BP Location: Right Arm)    Pulse 78    Temp 98.2 F (36.8 C)    Resp 16    Ht  (1.753 m)    Wt 83.9 kg    SpO2 99%    BMI 27.32 kg/m   Physical Exam Constitutional:      General: He is not in acute distress.    Appearance: Normal appearance. He is well-developed. He is not ill-appearing or diaphoretic.  HENT:     Head: Normocephalic and atraumatic.     Right Ear: External ear normal.     Left Ear: External ear normal.     Nose: Nose normal.  Eyes:     General: Vision grossly intact. Gaze aligned appropriately.     Conjunctiva/sclera: Conjunctivae normal.     Pupils: Pupils are equal, round, and reactive to light.  Neck:     Musculoskeletal: Full passive range of motion without pain, normal range of motion and neck supple.     Trachea: Trachea and phonation normal. No tracheal tenderness or tracheal deviation.  Cardiovascular:     Rate and Rhythm: Normal rate and regular rhythm.     Pulses:          Dorsalis pedis pulses are 2+ on the right side and 2+ on the left side.       Posterior tibial pulses are 2+ on the right side and 2+ on the left side.     Heart sounds: Normal heart sounds.  Pulmonary:     Effort: Pulmonary effort is normal. No accessory muscle usage or respiratory  distress.     Breath sounds: Normal breath sounds and air entry.  Chest:     Chest wall: No tenderness.  Abdominal:     General: Bowel sounds are normal. There is no distension.     Palpations: Abdomen is soft.     Tenderness: There is no abdominal tenderness. There is no guarding or rebound. Negative signs include Murphy's sign, Rovsing's sign and McBurney's sign.  Genitourinary:    Comments: Exam deferred by patient Musculoskeletal:     Right hip: Normal.     Left hip: Normal.     Right knee: He exhibits decreased range of motion and effusion. Tenderness found. Medial joint line and lateral joint line  tenderness noted.     Left knee: Normal.     Right ankle: Normal.     Left ankle: Normal.     Right lower leg: Normal.     Left lower leg: Normal.     Right foot: Normal.     Left foot: Normal.  Feet:     Right foot:     Protective Sensation: 5 sites tested. 5 sites sensed.     Left foot:     Protective Sensation: 5 sites tested. 5 sites sensed.  Skin:    General: Skin is warm and dry.  Neurological:     Mental Status: He is alert.     GCS: GCS eye subscore is 4. GCS verbal subscore is 5. GCS motor subscore is 6.     Comments: Speech is clear and goal oriented, follows commands Major Cranial nerves without deficit, no facial droop Moves extremities without ataxia, coordination intact  Psychiatric:        Behavior: Behavior normal.    ED Treatments / Results  Labs (all labs ordered are listed, but only abnormal results are displayed) Labs Reviewed  CBC WITH DIFFERENTIAL/PLATELET - Abnormal; Notable for the following components:      Result Value   WBC 16.1 (*)    Platelets 453 (*)    Neutro Abs 13.3 (*)    Abs Immature Granulocytes 0.14 (*)    All other components within normal limits  COMPREHENSIVE METABOLIC PANEL - Abnormal; Notable for the following components:   Potassium 3.1 (*)    Glucose, Bld 106 (*)    Calcium 8.8 (*)    Albumin 3.3 (*)    AST 13 (*)     All other components within normal limits  LIPASE, BLOOD  LACTIC ACID, PLASMA  LACTIC ACID, PLASMA  URINALYSIS, ROUTINE W REFLEX MICROSCOPIC    EKG None  Radiology Dg Knee Complete 4 Views Right  Result Date: 09/16/2018 CLINICAL DATA:  Knee pain. Recent proximal tibia fracture with open reduction and internal fixation on 09/04/2018. EXAM: RIGHT KNEE - COMPLETE 4+ VIEW COMPARISON:  Radiographs dated 09/05/2018, 09/04/2018 and CT scan dated 09/01/2018 FINDINGS: Again noted is the severely comminuted fracture of the proximal tibia. There are multiple slightly displaced fragments, unchanged. Side plates and multiple screws appear unchanged in position. There is an increased joint effusion. The largest fragment involving the posterior aspect of the medial tibial plateau remains slightly distracted and posteriorly displaced as demonstrated on the prior exams. IMPRESSION: 1. No significant change in the appearance of the severely comminuted fracture of the proximal right tibia with some displaced and impacted fragments. 2. Increased joint effusion. Electronically Signed   By: Francene Boyers M.D.   On: 09/16/2018 10:07   Vas [place] Lower Extremity Venous (dvt) (only [facility])  Result Date: 09/16/2018  Lower Venous Study Indications: Pain.  Performing Technologist: Blanch Media RVS  Examination Guidelines: A complete evaluation includes B-mode imaging, spectral Doppler, color Doppler, and power Doppler as needed of all accessible portions of each vessel. Bilateral testing is considered an integral part of a complete examination. Limited examinations for reoccurring indications may be performed as noted.  +---------+---------------+---------+-----------+----------+-------+  RIGHT     Compressibility Phasicity Spontaneity Properties Summary  +---------+---------------+---------+-----------+----------+-------+  CFV       Full            Yes       Yes                              +---------+---------------+---------+-----------+----------+-------+  SFJ       Full                                                      +---------+---------------+---------+-----------+----------+-------+  FV Prox   Full                                                      +---------+---------------+---------+-----------+----------+-------+  FV Mid    Full                                                      +---------+---------------+---------+-----------+----------+-------+  FV Distal Full                                                      +---------+---------------+---------+-----------+----------+-------+  PFV       Full                                                      +---------+---------------+---------+-----------+----------+-------+  POP       Full            Yes       Yes                             +---------+---------------+---------+-----------+----------+-------+  PTV       Full                                                      +---------+---------------+---------+-----------+----------+-------+  PERO      Full                                                      +---------+---------------+---------+-----------+----------+-------+   +----+---------------+---------+-----------+----------+-------+  LEFT Compressibility Phasicity Spontaneity Properties Summary  +----+---------------+---------+-----------+----------+-------+  CFV  Full            Yes       Yes                             +----+---------------+---------+-----------+----------+-------+     Summary: Right: There is no evidence of deep vein thrombosis in the lower extremity. A cystic structure is found in the popliteal fossa. Left: No evidence of common femoral vein obstruction.  *See table(s) above for measurements and observations.    Preliminary  Procedures Procedures (including critical care time)  Medications Ordered in ED Medications  HYDROmorphone (DILAUDID) injection 1 mg (1 mg Intravenous Given 09/16/18 0708)    sodium chloride 0.9 % bolus 1,000 mL (0 mLs Intravenous Stopped 09/16/18 1236)  potassium chloride SA (K-DUR) CR tablet 40 mEq (40 mEq Oral Given 09/16/18 1238)     Initial Impression / Assessment and Plan / ED Course  I have reviewed the triage vital signs and the nursing notes.  Pertinent labs & imaging results that were available during my care of the patient were reviewed by me and considered in my medical decision making (see chart for details).  Clinical Course as of Sep 15 1245  Wed Sep 16, 2018  1019 Discussed with Margaretha Glassing Ortho coming to see patient.   [BM]  1101 Ortho recommends pain control and outpatient follow-up next week, they doubt septic joint at this time.   [BM]    Clinical Course User Index [BM] Bill Salinas, PA-C   Lactic acid 1.2 Lipase within normal limits Urinalysis within normal limits CBC with leukocytosis of 16.1 CMP with hypokalemia of 3.1  Vascular ultrasound DVT study:    Summary:  Right: There is no evidence of deep vein thrombosis in the lower extremity. A cystic structure is found in the popliteal fossa.  Left: No evidence of common femoral vein obstruction.   DG Right Knee:  IMPRESSION:  1. No significant change in the appearance of the severely  comminuted fracture of the proximal right tibia with some displaced  and impacted fragments.  2. Increased joint effusion.  ---------- As patient with increasing effusion, leukocytosis and worsening pain of right knee there is concern for possible septic joint.  Orthopedics consulted patient seen by Earney Hamburg, PA-C from Ortho.  No concern from orthopedic standpoint for septic joint they recommend pain control and outpatient follow-up next week for further evaluation. - As to remainder patient's labs, abdominal pain nausea vomiting and diarrhea he is greatly improved in the emergency department following fluid bolus and pain control.  Suspect mild hypokalemia from patient's vomiting  diarrhea, discussed with Dr. Particia Nearing, will supplement 40 mg p.o potassium chloride here in the emergency department, suspect levels of correct with continued improvement of n/v.  Suspect leukocytosis secondary to vomiting and diarrhea from viral gastroenteritis.  Reexamination of the abdomen reveals a soft nontender abdomen without distention or peritoneal signs.  He is afebrile and vital signs within normal limits.  Low suspicion for cholecystitis, diverticulitis, obstruction, appendicitis, perforation or other acute abdominopelvic etiology of patient's symptoms today.  Patient does not meet SIRS or sepsis criteria.  Suspect patient experiencing a viral gastroenteritis at this time will provide patient with Zofran and encourage PCP follow-up.  Patient has tolerated both solids and liquids here in the emergency department per nursing staff.  I discussed the increase in white blood cell count with the patient as well as concern for viral gastroenteritis, discussed the potential for early bacterial infections with the patient for which I have low suspicion, CT abdomen/pelvis was offered, shared decision-making was made with the patient and he wishes to be discharged at this time with symptomatic control, he will return for any new or worsening symptoms.  PMP reviewed which shows patient was prescribed 15 pills of Norco on 09/09/2018, feel that it is reasonable to supply patient another short course of Norco at this time for his pain associated with his ORIF.  At this time there does not appear to be any evidence  of an acute emergency medical condition and the patient appears stable for discharge with appropriate outpatient follow up. Diagnosis was discussed with patient who verbalizes understanding of care plan and is agreeable to discharge. I have discussed return precautions with patient who verbalizes understanding of return precautions. Patient encouraged to follow-up with their PCP. All questions  answered.  Patient's case discussed with Dr. Particia NearingHaviland who agrees with plan to discharge with nausea/pain control and outpatient follow-up.   Note: Portions of this report may have been transcribed using voice recognition software. Every effort was made to ensure accuracy; however, inadvertent computerized transcription errors may still be present. Final Clinical Impressions(s) / ED Diagnoses   Final diagnoses:  Viral gastroenteritis  Hypokalemia  Effusion of right knee  Baker cyst, right    ED Discharge Orders         Ordered    HYDROcodone-acetaminophen (NORCO/VICODIN) 5-325 MG tablet  Every 6 hours PRN     09/16/18 1216    ondansetron (ZOFRAN ODT) 4 MG disintegrating tablet  Every 8 hours PRN     09/16/18 1216           Elizabeth PalauMorelli, Kynadie Yaun A, PA-C 09/16/18 1250    Jacalyn LefevreHaviland, Julie, MD 09/16/18 1305

## 2018-09-16 NOTE — ED Triage Notes (Signed)
Pt arrived via [facility]; pt from [facility] with c/o N/V since approx 12a ; pt warm to touch; pt had recent tib/fib sx on 4/13 & 4/19 with scheduled appt ;today for f/u sx. Pt c/o increased swelling and pain to RLE; Pt rec'd 4mg  of Zofran, bolus, Fentanyl; 99.7 temp; 100% on RA; pos pedal pulses; CBG 139; 80

## 2018-09-16 NOTE — Progress Notes (Signed)
Lower extremity venous has been completed.   Preliminary results in CV Proc.   Blanch Media 09/16/2018 8:56 AM

## 2018-09-16 NOTE — ED Notes (Signed)
Pt able to take fluids without difficulty.   No distress or pain s/sx.  Pain noted a 3.   IV fluids initiated at bolus rate.

## 2018-09-17 ENCOUNTER — Telehealth: Payer: Self-pay | Admitting: *Deleted

## 2018-09-17 ENCOUNTER — Telehealth (HOSPITAL_COMMUNITY): Payer: Self-pay | Admitting: Medical

## 2018-09-17 MED ORDER — HYDROCODONE-ACETAMINOPHEN 5-325 MG PO TABS: 1.0000 | ORAL_TABLET | Freq: Four times a day (QID) | ORAL | 0 refills | Status: DC | PRN

## 2018-09-17 NOTE — Telephone Encounter (Signed)
Pt called regarding Rx sent to pharmacy that does not fill narcotics.  EDCM contacted prescribing EDP to change pharmacy.  No further EDCM need identified at this time.

## 2018-09-17 NOTE — Telephone Encounter (Signed)
Informed by social work, Administrator, arts W, the prescription, for Norco 12 count 0 refill, yesterday went to [facility] and they are unable to fill narcotic prescriptions.  Patient did not have Norco filled yesterday and I have been asked to transfer prescription over to [facility] on [address].  New prescription Norco 12 count 0 refill sent, original Norco discontinued.   Note: Portions of this report may have been transcribed using voice recognition software. Every effort was made to ensure accuracy; however, inadvertent computerized transcription errors may still be present.
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
## 2018-09-20 ENCOUNTER — Other Ambulatory Visit: Payer: Self-pay

## 2018-09-20 ENCOUNTER — Emergency Department (HOSPITAL_COMMUNITY)
Admission: EM | Admit: 2018-09-20 | Discharge: 2018-09-21 | Disposition: A | Discharge status: Home / Self Care | Payer: Self-pay | Attending: Emergency Medicine | Admitting: Emergency Medicine

## 2018-09-20 ENCOUNTER — Encounter (HOSPITAL_COMMUNITY): Payer: Self-pay | Admitting: Emergency Medicine

## 2018-09-20 DIAGNOSIS — G8918 Other acute postprocedural pain: Secondary | ICD-10-CM | POA: Insufficient documentation

## 2018-09-20 DIAGNOSIS — J45909 Unspecified asthma, uncomplicated: Secondary | ICD-10-CM | POA: Insufficient documentation

## 2018-09-20 DIAGNOSIS — Z87891 Personal history of nicotine dependence: Secondary | ICD-10-CM | POA: Insufficient documentation

## 2018-09-20 DIAGNOSIS — Z79899 Other long term (current) drug therapy: Secondary | ICD-10-CM | POA: Insufficient documentation

## 2018-09-20 DIAGNOSIS — Z7901 Long term (current) use of anticoagulants: Secondary | ICD-10-CM | POA: Insufficient documentation

## 2018-09-20 MED ORDER — ONDANSETRON 8 MG PO TBDP
8.0000 mg | ORAL_TABLET | Freq: Once | ORAL | Status: AC
  Administered 2018-09-21: 8 mg via ORAL
  Filled 2018-09-20: qty 1

## 2018-09-20 MED ORDER — HYDROMORPHONE HCL 1 MG/ML IJ SOLN
1.0000 mg | Freq: Once | INTRAMUSCULAR | Status: AC
  Administered 2018-09-21: 1 mg via INTRAMUSCULAR
  Filled 2018-09-20: qty 1
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
## 2018-09-20 NOTE — ED Provider Notes (Signed)
[facility]-EMERGENCY DEPT Provider Note   CSN: [id number] Arrival date & time: 09/20/18  2331    History   Chief Complaint Chief Complaint  Patient presents with  . Leg Pain    rt leg    HPI Jacob Ho is a 43 y.o. male.     Patient presents to the emergency department by ambulance for right knee pain.  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  Patient has been in the ER multiple times with complaints of pain since the surgery.  He reports that he has been using the Vicodin but it has not helped.  He is taking the Vicodin twice a day.  No new injury.     Past Medical History:  Diagnosis Date  . Asthma   . Vitamin D deficiency 09/06/2018    Patient Active Problem List   Diagnosis Date Noted  . Chest pain 09/09/2018  . Vitamin D deficiency 09/06/2018  . Tibial plateau fracture 08/31/2018  . Asthma 04/11/2014  . Asthma exacerbation 02/16/2014    Past Surgical History:  Procedure Laterality Date  . EXTERNAL FIXATION LEG Right 09/01/2018   Procedure: EXTERNAL FIXATION LEG;  Surgeon: Myrene GalasHandy, Oswaldo, MD;  [facility];  Service: Orthopedics;  Laterality: Right;  . EXTERNAL FIXATION REMOVAL Right 09/04/2018   Procedure: REMOVAL EXTERNAL FIXATION LEG;  Surgeon: Myrene GalasHandy, Jiovanny, MD;  [facility];  Service: Orthopedics;  Laterality: Right;  . FINE NEEDLE ASPIRATION Right 09/01/2018   Procedure: Fine Needle Aspiration;  Surgeon: Myrene GalasHandy, Prince, MD;  [facility];  Service: Orthopedics;  Laterality: Right;  . ORIF TIBIA PLATEAU Right 09/04/2018   Procedure: OPEN REDUCTION INTERNAL FIXATION (ORIF) TIBIAL PLATEAU;  Surgeon: Myrene GalasHandy, Ashvin, MD;  Location: [facility] OR;  Service: Orthopedics;  Laterality: Right;        Home Medications    Prior to Admission medications   Medication Sig Start Date End Date Taking? Authorizing Provider  Cholecalciferol (VITAMIN D) 125 MCG (5000 UT) CAPS Take 1  capsule by mouth daily. 09/09/18   Allayne StackBeard, Samantha N, DO  docusate sodium (COLACE) 100 MG capsule Take 1 capsule (100 mg total) by mouth 2 (two) times daily. 09/08/18   Montez MoritaPaul, Keith, PA-C  enoxaparin (LOVENOX) 40 MG/0.4ML injection Inject 0.4 mLs (40 mg total) into the skin daily. 09/09/18   Allayne StackBeard, Samantha N, DO  HYDROcodone-acetaminophen (NORCO/VICODIN) 5-325 MG tablet Take 1-2 tablets by mouth every 6 (six) hours as needed. 09/17/18   Harlene SaltsMorelli, Brandon A, PA-C  methocarbamol (ROBAXIN) 750 MG tablet Take 1 tablet (750 mg total) by mouth every 8 (eight) hours as needed for muscle spasms. 09/09/18   Allayne StackBeard, Samantha N, DO  mometasone-formoterol (DULERA) 200-5 MCG/ACT AERO Inhale 2 puffs into the lungs 2 (two) times daily. 07/09/17   Hedges, Tinnie GensJeffrey, PA-C  ondansetron (ZOFRAN ODT) 4 MG disintegrating tablet Take 1 tablet (4 mg total) by mouth every 8 (eight) hours as needed for nausea or vomiting. 09/16/18   Harlene SaltsMorelli, Brandon A, PA-C  vitamin C (VITAMIN C) 1000 MG tablet Take 1 tablet (1,000 mg total) by mouth daily. 09/09/18   Montez MoritaPaul, Keith, PA-C    Family History Family History  Problem Relation Age of Onset  . Diabetes Mother   . Asthma Mother   . Heart attack Father   . Asthma Sister     Social History Social History   Tobacco Use  . Smoking status: Former Engineer, civil (consulting)moker    Start date:  05/21/1991    Last attempt to quit: 09/09/2013    Years since quitting: 5.0  . Smokeless tobacco: Never Used  . Tobacco comment: smoking black and milds 1 a day  Substance Use Topics  . Alcohol use: No  . Drug use: Yes    Comment: marijuana occasionally     Allergies   Iodine and Shellfish allergy   Review of Systems Review of Systems  Musculoskeletal: Positive for arthralgias.  All other systems reviewed and are negative.    Physical Exam Updated Vital Signs BP 133/84 (BP Location: Left Arm)   Pulse 84   Temp 98.5 F (36.9 C) (Oral)   Resp 17   Ht 5\' 9"  (1.753 m)   Wt 83.9 kg   SpO2 100%   BMI 27.32  kg/m   Physical Exam Vitals signs and nursing note reviewed.  Constitutional:      General: He is not in acute distress.    Appearance: Normal appearance. He is well-developed.  HENT:     Head: Normocephalic and atraumatic.     Right Ear: Hearing normal.     Left Ear: Hearing normal.     Nose: Nose normal.  Eyes:     Conjunctiva/sclera: Conjunctivae normal.     Pupils: Pupils are equal, round, and reactive to light.  Neck:     Musculoskeletal: Normal range of motion and neck supple.  Cardiovascular:     Rate and Rhythm: Regular rhythm.     Heart sounds: S1 normal and S2 normal. No murmur. No friction rub. No gallop.   Pulmonary:     Effort: Pulmonary effort is normal. No respiratory distress.     Breath sounds: Normal breath sounds.  Chest:     Chest wall: No tenderness.  Abdominal:     General: Bowel sounds are normal.     Palpations: Abdomen is soft.     Tenderness: There is no abdominal tenderness. There is no guarding or rebound. Negative signs include Murphy's sign and McBurney's sign.     Hernia: No hernia is present.  Musculoskeletal: Normal range of motion.     Right knee: He exhibits effusion. Tenderness found.  Skin:    General: Skin is warm and dry.     Findings: No rash.  Neurological:     Mental Status: He is alert and oriented to person, place, and time.     GCS: GCS eye subscore is 4. GCS verbal subscore is 5. GCS motor subscore is 6.     Cranial Nerves: No cranial nerve deficit.     Sensory: No sensory deficit.     Coordination: Coordination normal.  Psychiatric:        Speech: Speech normal.        Behavior: Behavior normal.        Thought Content: Thought content normal.      ED Treatments / Results  Labs (all labs ordered are listed, but only abnormal results are displayed) Labs Reviewed - No data to display  EKG None  Radiology No results found.  Procedures Procedures (including critical care time)  Medications Ordered in ED  Medications  HYDROmorphone (DILAUDID) injection 1 mg (has no administration in time range)  ondansetron (ZOFRAN-ODT) disintegrating tablet 8 mg (has no administration in time range)     Initial Impression / Assessment and Plan / ED Course  I have reviewed the triage vital signs and the nursing notes.  Pertinent labs & imaging results that were available during my care of  the patient were reviewed by me and considered in my medical decision making (see chart for details).        Patient returns to the emergency department with continued pain in the right leg.  Patient suffered tibial plateau fracture earlier this month and had ORIF on April 14.  He has been seen in the ER with complaints of pain multiple times since then.  At previous visit he had a surgical consultation, orthopedics evaluated him and did not feel there was anything further needed other than analgesia.  Specifically, they did not feel that there was any sign of infection as he did have a joint effusion at that time.  Patient still has a small effusion on examination, no erythema, warmth, fever or other concerns for infection at this time based on examination.  Surgical site clean and dry.  Patient also had venous duplex at previous visit, no evidence of DVT.  He is on Lovenox postoperatively.  No calf swelling or tenderness, has good distal pulses.  Therefore examination does not reveal any concern for vascular compromise or infection, treat as normal postoperative pain.  Final Clinical Impressions(s) / ED Diagnoses   Final diagnoses:  Postoperative pain    ED Discharge Orders    None       Tadd Holtmeyer, Canary Brim, MD 09/21/18 0000

## 2018-09-21 NOTE — Discharge Instructions (Signed)
You are experiencing pain related to a broken bone and recent surgery.  Your pain needs to be managed by your surgeon.  You need to call your surgeon and be seen in the office if you have further problems.

## 2018-09-25 ENCOUNTER — Other Ambulatory Visit: Payer: Self-pay | Admitting: Orthopedic Surgery

## 2018-09-25 DIAGNOSIS — S82141A Displaced bicondylar fracture of right tibia, initial encounter for closed fracture: Secondary | ICD-10-CM

## 2018-09-29 ENCOUNTER — Ambulatory Visit (HOSPITAL_COMMUNITY)
Admission: RE | Admit: 2018-09-29 | Discharge: 2018-09-29 | Disposition: A | Discharge status: Home / Self Care | Payer: Self-pay | Source: Ambulatory Visit | Attending: Orthopedic Surgery | Admitting: Orthopedic Surgery

## 2018-09-29 ENCOUNTER — Other Ambulatory Visit: Payer: Self-pay

## 2018-09-29 ENCOUNTER — Other Ambulatory Visit: Payer: Self-pay | Admitting: Orthopedic Surgery

## 2018-09-29 DIAGNOSIS — S82141A Displaced bicondylar fracture of right tibia, initial encounter for closed fracture: Secondary | ICD-10-CM

## 2018-11-23 ENCOUNTER — Emergency Department (HOSPITAL_COMMUNITY)
Admission: EM | Admit: 2018-11-23 | Discharge: 2018-11-23 | Disposition: A | Discharge status: Home / Self Care | Payer: Self-pay | Attending: Emergency Medicine | Admitting: Emergency Medicine

## 2018-11-23 ENCOUNTER — Encounter (HOSPITAL_COMMUNITY): Payer: Self-pay | Admitting: *Deleted

## 2018-11-23 ENCOUNTER — Other Ambulatory Visit: Payer: Self-pay

## 2018-11-23 DIAGNOSIS — G8929 Other chronic pain: Secondary | ICD-10-CM | POA: Insufficient documentation

## 2018-11-23 DIAGNOSIS — J4520 Mild intermittent asthma, uncomplicated: Secondary | ICD-10-CM | POA: Insufficient documentation

## 2018-11-23 DIAGNOSIS — F1290 Cannabis use, unspecified, uncomplicated: Secondary | ICD-10-CM | POA: Insufficient documentation

## 2018-11-23 DIAGNOSIS — M25561 Pain in right knee: Secondary | ICD-10-CM | POA: Insufficient documentation

## 2018-11-23 DIAGNOSIS — Z720 Tobacco use: Secondary | ICD-10-CM | POA: Insufficient documentation

## 2018-11-23 DIAGNOSIS — Z79899 Other long term (current) drug therapy: Secondary | ICD-10-CM | POA: Insufficient documentation

## 2018-11-23 MED ORDER — OXYCODONE-ACETAMINOPHEN 5-325 MG PO TABS
2.0000 | ORAL_TABLET | Freq: Once | ORAL | Status: AC
  Administered 2018-11-23: 2 via ORAL
  Filled 2018-11-23: qty 2

## 2018-11-23 MED ORDER — MOMETASONE FURO-FORMOTEROL FUM 200-5 MCG/ACT IN AERO: 2.0000 | INHALATION_SPRAY | Freq: Two times a day (BID) | RESPIRATORY_TRACT | 3 refills | Status: DC

## 2018-11-23 MED FILL — DULERA 200 MCG/5 MCG INH: 200-5 | 30 days supply | Qty: 13 | Fill #0

## 2018-11-23 NOTE — ED Triage Notes (Signed)
States he had knee surgery April 13 and 18, c/o increased pain, states he is suppose to see Dr. Marcelino Scot 7/15.

## 2018-11-23 NOTE — ED Provider Notes (Signed)
[name] EMERGENCY DEPARTMENT Provider Note   CSN: [id number] Arrival date & time: 11/23/18  1025    History   Chief Complaint Chief Complaint  Patient presents with  . Knee Pain    HPI Jacob Ho is a 43 y.o. male.     HPI   43 year old male presents today with complaints of right knee pain.  Patient notes he April 2020 he suffered a right-sided tibial plateau fracture.  He was on Percocet but notes that he recently ran out.  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  He is currently waiting to follow-up as an outpatient with primary care.  He notes no significant change in his pain, achiness to the right knee with no significant swelling redness or warmth.  Denies any fever.  He also notes that he has run out of his Dulera and occasionally has asthma exacerbations, no shortness of breath or chest pain presently.   Past Medical History:  Diagnosis Date  . Asthma   . Vitamin D deficiency 09/06/2018    Patient Active Problem List   Diagnosis Date Noted  . Chest pain 09/09/2018  . Vitamin D deficiency 09/06/2018  . Tibial plateau fracture 08/31/2018  . Asthma 04/11/2014  . Asthma exacerbation 02/16/2014    Past Surgical History:  Procedure Laterality Date  . EXTERNAL FIXATION LEG Right 09/01/2018   Procedure: EXTERNAL FIXATION LEG;  Surgeon: Altamese [place], MD;  Location: [facility];  Service: Orthopedics;  Laterality: Right;  . EXTERNAL FIXATION REMOVAL Right 09/04/2018   Procedure: REMOVAL EXTERNAL FIXATION LEG;  Surgeon: Altamese [place], MD;  Location: [facility];  Service: Orthopedics;  Laterality: Right;  . FINE NEEDLE ASPIRATION Right 09/01/2018   Procedure: Fine Needle Aspiration;  Surgeon: Altamese [place], MD;  Location: [facility];  Service: Orthopedics;  Laterality: Right;  . ORIF TIBIA PLATEAU Right 09/04/2018   Procedure: OPEN REDUCTION INTERNAL FIXATION (ORIF) TIBIAL PLATEAU;  Surgeon: Altamese , MD;   Location: [facility];  Service: Orthopedics;  Laterality: Right;        Home Medications    Prior to Admission medications   Medication Sig Start Date End Date Taking? Authorizing Provider  Cholecalciferol (VITAMIN D) 125 MCG (5000 UT) CAPS Take 1 capsule by mouth daily. 09/09/18   Patriciaann Clan, DO  docusate sodium (COLACE) 100 MG capsule Take 1 capsule (100 mg total) by mouth 2 (two) times daily. 09/08/18   Ainsley Spinner, PA-C  enoxaparin (LOVENOX) 40 MG/0.4ML injection Inject 0.4 mLs (40 mg total) into the skin daily. 09/09/18   Patriciaann Clan, DO  HYDROcodone-acetaminophen (NORCO/VICODIN) 5-325 MG tablet Take 1-2 tablets by mouth every 6 (six) hours as needed. 09/17/18   Nuala Alpha A, PA-C  methocarbamol (ROBAXIN) 750 MG tablet Take 1 tablet (750 mg total) by mouth every 8 (eight) hours as needed for muscle spasms. 09/09/18   Patriciaann Clan, DO  mometasone-formoterol (DULERA) 200-5 MCG/ACT AERO Inhale 2 puffs into the lungs 2 (two) times daily. 11/23/18   Tayvien Kane, Dellis Filbert, PA-C  ondansetron (ZOFRAN ODT) 4 MG disintegrating tablet Take 1 tablet (4 mg total) by mouth every 8 (eight) hours as needed for nausea or vomiting. 09/16/18   Nuala Alpha A, PA-C  vitamin C (VITAMIN C) 1000 MG tablet Take 1 tablet (1,000 mg total) by mouth daily. 09/09/18   Ainsley Spinner, PA-C    Family History Family History  Problem Relation Age of Onset  . Diabetes Mother   .  Asthma Mother   . Heart attack Father   . Asthma Sister     Social History Social History   Tobacco Use  . Smoking status: Former Smoker    Start date: 05/21/1991    Quit date: 09/09/2013    Years since quitting: 5.2  . Smokeless tobacco: Never Used  . Tobacco comment: smoking black and milds 1 a day  Substance Use Topics  . Alcohol use: No  . Drug use: Yes    Comment: marijuana occasionally     Allergies   Iodine and Shellfish allergy   Review of Systems Review of Systems   Physical Exam Updated Vital Signs BP  124/83   Pulse 70   Temp 98 F (36.7 C)   Resp 18   Ht 5\' 9"  (1.753 m)   Wt 84.8 kg   SpO2 99%   BMI 27.62 kg/m   Physical Exam Vitals signs and nursing note reviewed.  Constitutional:      Appearance: He is well-developed.  HENT:     Head: Normocephalic and atraumatic.  Eyes:     General: No scleral icterus.       Right eye: No discharge.        Left eye: No discharge.     Conjunctiva/sclera: Conjunctivae normal.     Pupils: Pupils are equal, round, and reactive to light.  Neck:     Musculoskeletal: Normal range of motion.     Vascular: No JVD.     Trachea: No tracheal deviation.  Pulmonary:     Effort: Pulmonary effort is normal. No respiratory distress.     Breath sounds: Normal breath sounds. No stridor. No wheezing, rhonchi or rales.  Neurological:     Mental Status: He is alert and oriented to person, place, and time.     Coordination: Coordination normal.  Psychiatric:        Behavior: Behavior normal.        Thought Content: Thought content normal.        Judgment: Judgment normal.      ED Treatments / Results  Labs (all labs ordered are listed, but only abnormal results are displayed) Labs Reviewed - No data to display  EKG None  Radiology No results found.  Procedures Procedures (including critical care time)  Medications Ordered in ED Medications  oxyCODONE-acetaminophen (PERCOCET/ROXICET) 5-325 MG per tablet 2 tablet (2 tablets Oral Given 11/23/18 1153)     Initial Impression / Assessment and Plan / ED Course  I have reviewed the triage vital signs and the nursing notes.  Pertinent labs & imaging results that were available during my care of the patient were reviewed by me and considered in my medical decision making (see chart for details).        43 year old male presents today with knee pain.  Patient has tibial plateau fracture.  Patient having chronic pain, discussed the need for outpatient follow-up for ongoing pain management.  He  was given dose of pain medicine here.  Patient also requesting refill of his Dulera, no asthma exacerbation presently.  Signs of infection or changes in his chronic knee pain.  Patient verbalized understanding and agreement to today's plan had no further questions or concerns.  Final Clinical Impressions(s) / ED Diagnoses   Final diagnoses:  Chronic pain of right knee  Intermittent asthma without complication, unspecified asthma severity    ED Discharge Orders         Ordered    mometasone-formoterol (DULERA) 200-5 MCG/ACT AERO  2 times daily     11/23/18 1201           Rosalio LoudHedges, Evelyna Folker, PA-C 11/23/18 1202    Gerhard MunchLockwood, Robert, MD 11/24/18 [phone]

## 2018-11-23 NOTE — Discharge Instructions (Addendum)
Please read attached information. If you experience any new or worsening signs or symptoms please return to the emergency room for evaluation. Please follow-up with your primary care provider or specialist as discussed. Please use medication prescribed only as directed and discontinue taking if you have any concerning signs or symptoms.   °

## 2018-11-23 NOTE — ED Notes (Signed)
Patient verbalizes understanding of discharge instructions. Opportunity for questioning and answers were provided. Armband removed by staff, pt discharged from ED ambulatory by self\  

## 2018-12-02 NOTE — Progress Notes (Signed)
Patient ID: Jacob Ho, male   DOB: [birth year], 43 y.o.   MRN: [id number]  Virtual Visit via Telephone Note  I connected with Berlinda Last on 12/03/18 at  9:50 AM EDT by telephone and verified that I am speaking with the correct person using two identifiers.   I discussed the limitations, risks, security and privacy concerns of performing an evaluation and management service by telephone and the availability of in person appointments. I also discussed with the patient that there may be a patient responsible charge related to this service. The patient expressed understanding and agreed to proceed.  Patient location:  home My Location:  [facility] office Persons on the call:  Me and the patient  History of Present Illness: After being seen in ED 11/23/2018 requesting pain medicine with h/o R tibial plateau fracture.  Also requested dulera for asthma.  He has had 2 surgeries on R knee and says he still requires pain meds but no longer being given the pain meds by ortho.    From ED A/P: 43 year old male presents today with knee pain.  Patient has tibial plateau fracture.  Patient having chronic pain, discussed the need for outpatient follow-up for ongoing pain management.  He was given dose of pain medicine here.  Patient also requesting refill of his Dulera, no asthma exacerbation presently.  Signs of infection or changes in his chronic knee pain.  Patient verbalized understanding and agreement to today's plan had no further questions or concerns    Observations/Objective: A&Ox3   Assessment and Plan: 1. Closed fracture of right tibial plateau, initial encounter - Ambulatory referral to Pain Clinic  2. Intermittent asthma, unspecified asthma severity, unspecified whether complicated - albuterol (VENTOLIN HFA) 108 (90 Base) MCG/ACT inhaler; Inhale 2 puffs into the lungs every 6 (six) hours as needed for wheezing or shortness of breath.  Dispense: 18 g; Refill: 2  3. Encounter for examination  following treatment at hospital   Follow Up Instructions: Assign PCP in 6-8 weeks   I discussed the assessment and treatment plan with the patient. The patient was provided an opportunity to ask questions and all were answered. The patient agreed with the plan and demonstrated an understanding of the instructions.   The patient was advised to call back or seek an in-person evaluation if the symptoms worsen or if the condition fails to improve as anticipated.  I provided 10 minutes of non-face-to-face time during this encounter.   Freeman Caldron, PA-C

## 2018-12-03 ENCOUNTER — Other Ambulatory Visit: Payer: Self-pay

## 2018-12-03 ENCOUNTER — Ambulatory Visit: Payer: Self-pay | Attending: Family Medicine | Admitting: Physician Assistant

## 2018-12-03 DIAGNOSIS — Z09 Encounter for follow-up examination after completed treatment for conditions other than malignant neoplasm: Secondary | ICD-10-CM

## 2018-12-03 DIAGNOSIS — J4520 Mild intermittent asthma, uncomplicated: Secondary | ICD-10-CM

## 2018-12-03 DIAGNOSIS — M25561 Pain in right knee: Secondary | ICD-10-CM

## 2018-12-03 DIAGNOSIS — S82141A Displaced bicondylar fracture of right tibia, initial encounter for closed fracture: Secondary | ICD-10-CM

## 2018-12-03 DIAGNOSIS — G8929 Other chronic pain: Secondary | ICD-10-CM

## 2018-12-03 DIAGNOSIS — S82141D Displaced bicondylar fracture of right tibia, subsequent encounter for closed fracture with routine healing: Secondary | ICD-10-CM

## 2018-12-03 MED ORDER — ALBUTEROL SULFATE HFA 108 (90 BASE) MCG/ACT IN AERS: 2.0000 | INHALATION_SPRAY | Freq: Four times a day (QID) | RESPIRATORY_TRACT | 2 refills | Status: DC | PRN

## 2018-12-03 MED FILL — ALBUTEROL SULFATE HFA 108 (: 108 (90 BAS | 25 days supply | Qty: 18 | Fill #0

## 2018-12-03 NOTE — Progress Notes (Signed)
Patient verified DOB Patient has eaten today. Patient has not taken medication today. Patient complains of right knee pain scaled at a 7.

## 2018-12-14 ENCOUNTER — Telehealth: Payer: Self-pay | Admitting: *Deleted

## 2018-12-14 NOTE — Telephone Encounter (Signed)
Pt needed CAFA application mailed, but phone line cut off and I was not able to confirm address nor explain process

## 2018-12-24 MED FILL — !VENTOLIN HFA INHALER: 108 (90 BAS | 25 days supply | Qty: 18 | Fill #0

## 2018-12-29 ENCOUNTER — Other Ambulatory Visit: Payer: Self-pay

## 2018-12-29 ENCOUNTER — Encounter: Payer: Self-pay | Attending: Physical Medicine and Rehabilitation | Admitting: Physical Medicine and Rehabilitation

## 2018-12-29 ENCOUNTER — Encounter: Payer: Self-pay | Admitting: Physical Medicine and Rehabilitation

## 2018-12-29 VITALS — BP 133/89 | HR 108 | Temp 99.4°F | Ht 69.0 in | Wt 180.0 lb

## 2018-12-29 DIAGNOSIS — G8921 Chronic pain due to trauma: Secondary | ICD-10-CM

## 2018-12-29 DIAGNOSIS — S82141H Displaced bicondylar fracture of right tibia, subsequent encounter for open fracture type I or II with delayed healing: Secondary | ICD-10-CM

## 2018-12-29 DIAGNOSIS — Z7409 Other reduced mobility: Secondary | ICD-10-CM

## 2018-12-29 DIAGNOSIS — Z5181 Encounter for therapeutic drug level monitoring: Secondary | ICD-10-CM

## 2018-12-29 MED ORDER — DULOXETINE HCL 30 MG PO CPEP: 30.0000 mg | ORAL_CAPSULE | Freq: Every day | ORAL | 3 refills | Status: DC

## 2018-12-29 MED ORDER — OXYCODONE HCL 5 MG PO TABS: 5.0000 mg | ORAL_TABLET | Freq: Four times a day (QID) | ORAL | 0 refills | Status: AC | PRN

## 2018-12-29 MED ORDER — OXYCODONE HCL 5 MG PO TABS: 5.0000 mg | ORAL_TABLET | Freq: Four times a day (QID) | ORAL | 0 refills | Status: DC | PRN

## 2018-12-29 NOTE — Patient Instructions (Signed)
   1. R tibial plateau fracture in 08/2018 -needs drug screen however will give patient 1 month's supply for pain medicine- if drug screen appropriate, can con't meds -supposedly PCP "doesn't prescribe narcotics".  -plan for Total knee replacement, per pt. Don't want to use up his PT now that would need after TKR on R. Will wait on this. Oxycodone 5 mg q6 hours As needed Reviewed PDMP- documented  2. Impaired ADLs and moblity with decreased ROM/mobility and WB on RLE - doing HEP every day. Con't current regimen.  - do medial leg lifts start out with 10 and work up gradually- goal hold for 3 seconds.   needs shower chair- either get outdoor chair from [place] or get one from [facility] for $50. - slide on butt up and down stairs  3. Chronic pain - Duloxetine 30 mg daily- x 1 week then 60 mg daily- if makes sleepy, take at night- if makes nauseated, call for antinausea medicine. Nausea can last 7 days max.  Went over sexual side effects. [facility] charges $38 for 90 days.  - Lidocaine cream vs patches- patches 12 hrs on;12 hrs off. Or cream 4x/day.   4. Dispo- f/u in 2 months

## 2018-12-29 NOTE — Progress Notes (Signed)
Subjective:    Patient ID: Jacob Ho, male    DOB: 05/20/1975, 43 y.o.   MRN: [id number]  R knee pain   HPI Patient is a 43 year old male with no previous PMHx except asthma who had incident on 08/30/2018 who caused him to sustain a traumatic injury to R knee with  A severe tibial plateau fracture (into small multiple fragments) and a PCL complete rupture- has undergone 2 surgeries since then-including an ORIF and external fixator.   Since then, still having pain in R knee/RLE- 7/10- chronic 7/10-  Had a long pain the other day, like bashing leg in with a sledgehammer- brought him to tears.  Couldn't go back to sleep- normally it's 7/10- usually stabbing, aching/throbbing depends on the day.  Plan for R total knee replacement in near future.   Can't drink- don't drink, but nothing working Ibuprofen taking 5-6 of them- helps short term but just takes the edge off. Scared to smoke pot because asthma and police.  Haven't used CBD oil or anything else OTC.   Worse- lying on side makes it worse Is R side sleeper.  Nothing makes it better than ibuprofen. Vicodan didn't work- didn't work at all- and had Dr Carola FrostHandy changed meds to [employer]ercocet.     Supposed to start putting weight on RLE- but only tolerating small amounts of weight on RLE as of yet- sets off the sharp- basically walking NWB on RLE all the time.   Pain feels as bad as it did when left the hospital Doesn't feel any better than initially.   Tried: Hasn't tried lidocaine No Rx antiinflammatories No other pain meds except vicodin (back in April) or percocet   Pain Inventory Average Pain 7 Pain Right Now 7 My pain is sharp, burning, stabbing and aching  In the last 24 hours, has pain interfered with the following? General activity 7 Relation with others 9 Enjoyment of life 10 What TIME of day is your pain at its worst? night Sleep (in general) Poor  Pain is worse with: walking, bending, standing and some activites  Pain improves with: medication Relief from Meds: 6  Mobility walk with assistance ability to climb steps?  yes do you drive?  no Uses 1 crutch to get around- using with 1 crutch for ~ 5weeks- since last visit with Dr Carola FrostHandy.  Function not employed: date last employed . I need assistance with the following:  bathing and household duties Fiance' will help him shower to get bathed/keep balance.   Neuro/Psych numbness tremor spasms depression anxiety  Prior Studies Any changes since last visit?  no  Physicians involved in your care Any changes since last visit?  no Dr Carola FrostHandy- Ortho- appt tomorrow for R knee/possible additonal surgery  Family History  Problem Relation Age of Onset  . Diabetes Mother   . Asthma Mother   . Heart attack Father   . Asthma Sister    Social History   Socioeconomic History  . Marital status: Single    Spouse name: Not on file  . Number of children: Not on file  . Years of education: Not on file  . Highest education level: Not on file  Occupational History  . Not on file  Social Needs  . Financial resource strain: Not on file  . Food insecurity    Worry: Not on file    Inability: Not on file  . Transportation needs    Medical: Not on file    Non-medical: Not  on file  Tobacco Use  . Smoking status: Former Smoker    Start date: 05/21/1991    Quit date: 09/09/2013    Years since quitting: 5.3  . Smokeless tobacco: Never Used  . Tobacco comment: smoking black and milds 1 a day  Substance and Sexual Activity  . Alcohol use: No  . Drug use: Yes    Comment: marijuana occasionally  . Sexual activity: Yes    Partners: Female  Lifestyle  . Physical activity    Days per week: Not on file    Minutes per session: Not on file  . Stress: Not on file  Relationships  . Social Herbalist on phone: Not on file    Gets together: Not on file    Attends religious service: Not on file    Active member of club or organization: Not on  file    Attends meetings of clubs or organizations: Not on file    Relationship status: Not on file  Other Topics Concern  . Not on file  Social History Narrative  . Not on file   Past Surgical History:  Procedure Laterality Date  . EXTERNAL FIXATION LEG Right 09/01/2018   Procedure: EXTERNAL FIXATION LEG;  Surgeon: Altamese [place], MD;  Location: [place];  Service: Orthopedics;  Laterality: Right;  . EXTERNAL FIXATION REMOVAL Right 09/04/2018   Procedure: REMOVAL EXTERNAL FIXATION LEG;  Surgeon: Altamese [place], MD;  Location: [place];  Service: Orthopedics;  Laterality: Right;  . FINE NEEDLE ASPIRATION Right 09/01/2018   Procedure: Fine Needle Aspiration;  Surgeon: Altamese [place], MD;  Location: [place];  Service: Orthopedics;  Laterality: Right;  . ORIF TIBIA PLATEAU Right 09/04/2018   Procedure: OPEN REDUCTION INTERNAL FIXATION (ORIF) TIBIAL PLATEAU;  Surgeon: Altamese , MD;  Location: [place];  Service: Orthopedics;  Laterality: Right;   Past Medical History:  Diagnosis Date  . Asthma   . Vitamin D deficiency 09/06/2018   Social Hx- fiance' [place], [place]- 2 story 3-4 STE- and bedroom upstairs Disability and Medicaid in process right now.  BP 133/89   Pulse (!) 108   Temp 99.4 F (37.4 C)   Ht 5\' 9"  (1.753 m)   Wt 180 lb (81.6 kg)   SpO2 99%   BMI 26.58 kg/m   Opioid Risk Score:   Fall Risk Score:  `1  Depression screen PHQ 2/9  Depression screen PHQ 2/9 12/03/2018  Decreased Interest 1  Down, Depressed, Hopeless 1  PHQ - 2 Score 2  Altered sleeping 1  Tired, decreased energy 1  Change in appetite 0  Trouble concentrating 1  Moving slowly or fidgety/restless 1  Suicidal thoughts 0  PHQ-9 Score 6     Review of Systems  Constitutional: Positive for appetite change.  HENT: Negative.   Eyes: Negative.   Respiratory: Negative.   Cardiovascular: Negative.   Gastrointestinal: Negative.   Endocrine: Negative.   Genitourinary: Negative.   Musculoskeletal:  Positive for arthralgias, gait problem, joint swelling and myalgias.  Skin: Negative.   Allergic/Immunologic: Negative.   Neurological: Positive for tremors and numbness.  Hematological: Negative.   Psychiatric/Behavioral: Positive for dysphoric mood. The patient is nervous/anxious.   All other systems reviewed and are negative. denies constipation, even when taking pain meds     Objective:   Physical Exam  Awake, alert, appropriate, NAD RRR  CTA B/L of lungs B/L- no w/r/r abd soft, NT, ND, (+)BS- normoactive R crutch/1 crutch in room- puts on L  when stands Large scar on medial aspect of R distal knee Mild to moderate swelling in R knee- not ankle Effusion - moderate, also noted Mild erythema to skin- under skin tone Severe TTP over R tibial plateau- right over scar Also very TTP over patella and could not tolerate doing ROM of patella, even with leg supported Baker's cyst evident in knee posteriorly Mild- moderate atrophy of medial and lateral quad on R compared to L which is intact When stands, R knee flexed- cannot get to full extension- lacking ~ 20 degrees extension Appears bowlegged just on R Acquired leg length discrepancy on R due to lack of extension       Assessment & Plan:    1. R tibial plateau fracture in 08/2018 with impaired gait -needs drug screen however will give patient 1 month's supply for pain medicine- if drug screen appropriate, can con't meds -supposedly PCP "doesn't prescribe narcotics".  -plan for Total knee replacement, per pt. Don't want to use up his PT now that would need after TKR on R. Will wait on this. Oxycodone 5 mg q6 hours As needed Reviewed PDMP- documented  2. Impaired ADLs and moblity with decreased ROM/mobility and WB on RLE - doing HEP every day. Con't current regimen.  - do medial leg lifts start out with 10 and work up gradually- goal hold for 3 seconds.   needs shower chair- either get outdoor chair from [employer] or [facility]  [facility]. - slide on butt up and down stairs  3. Chronic pain due to trauma - Duloxetine 30 mg daily- x 1 week then 60 mg daily- if makes sleepy, take at night- if makes nauseated, call for antinausea medicine. Nausea can last 7 days max.  Went over sexual side effects. [facility] charges $38 for 90 days.  - Lidocaine cream vs patches- patches 12 hrs on;12 hrs off. Or cream 4x/day.   4. Dispo- f/u in 2 months  Spent a total of 45 minutes on appointment- more than 5 minutes discussing plan, going over options due to lack of insurance, and how to trouble shoot/get pain controlled.

## 2018-12-29 NOTE — Telephone Encounter (Signed)
Original prescription was unable to print, reprinting per request of provider.

## 2019-01-04 LAB — DRUG TOX MONITOR 1 W/CONF, ORAL FLD
Amphetamine: NEGATIVE ng/mL (ref <10)
Amphetamines: POSITIVE ng/mL — AB (ref <10)
Barbiturates: NEGATIVE ng/mL (ref <10)
Benzodiazepines: NEGATIVE ng/mL (ref <0.50)
Buprenorphine: NEGATIVE ng/mL (ref <0.10)
Cocaine: NEGATIVE ng/mL (ref <5.0)
Cotinine: 39.9 ng/mL — ABNORMAL HIGH (ref <5.0)
Fentanyl: NEGATIVE ng/mL (ref <0.10)
Heroin Metabolite: NEGATIVE ng/mL (ref <1.0)
MARIJUANA: POSITIVE ng/mL — AB (ref <2.5)
MDMA: NEGATIVE ng/mL (ref <10)
Meprobamate: NEGATIVE ng/mL (ref <2.5)
Methadone: NEGATIVE ng/mL (ref <5.0)
Methamphetamine: 13 ng/mL — ABNORMAL HIGH (ref <10)
Nicotine Metabolite: POSITIVE ng/mL — AB (ref <5.0)
Norbuprenorphine: NEGATIVE ng/mL (ref <0.50)
Opiates: NEGATIVE ng/mL (ref <2.5)
Phencyclidine: NEGATIVE ng/mL (ref <10)
THC: 26.7 ng/mL — ABNORMAL HIGH (ref <2.5)
Tapentadol: NEGATIVE ng/mL (ref <5.0)
Tramadol: NEGATIVE ng/mL (ref <5.0)
Zolpidem: NEGATIVE ng/mL (ref <5.0)

## 2019-01-04 LAB — DRUG TOX ALC METAB W/CON, ORAL FLD: Alcohol Metabolite: NEGATIVE ng/mL (ref <25)

## 2019-01-06 ENCOUNTER — Telehealth: Payer: Self-pay | Admitting: Physical Medicine and Rehabilitation

## 2019-01-06 ENCOUNTER — Telehealth: Payer: Self-pay | Admitting: *Deleted

## 2019-01-06 NOTE — Telephone Encounter (Signed)
Oral swab drug screen is inconsistent.  He is positive for THC/marijuana and Amphetamine/methamphetamine which are unprescribed medications. Please advise.

## 2019-01-06 NOTE — Telephone Encounter (Signed)
Pt's PO drug screen was found to be positive for amphetamine, methamphetamine as well as Marijuana, and negative for opiates (he had run out of pain meds 2 weeks prior).  We are sending him a letter as formal warning that he needs to be negative for all non prescribed substances to receive any controlled medications in the future.  Letter printed and will give to staff to mail.

## 2019-01-06 NOTE — Telephone Encounter (Signed)
Letter mailed with written warning. He will be screened again at next visit. He needs to sign CSA/OPIOID TX PLAN.

## 2019-01-06 NOTE — Telephone Encounter (Signed)
I printed out letter that I fixed to send to him- he needs a formal warning and drug screen at all appointments and 1 (-) prior to receiving another prescription.

## 2019-01-14 ENCOUNTER — Encounter: Payer: Self-pay | Admitting: Internal Medicine

## 2019-01-14 ENCOUNTER — Ambulatory Visit: Payer: Self-pay | Attending: Internal Medicine | Admitting: Internal Medicine

## 2019-01-14 ENCOUNTER — Other Ambulatory Visit: Payer: Self-pay

## 2019-01-14 DIAGNOSIS — M25561 Pain in right knee: Secondary | ICD-10-CM

## 2019-01-14 DIAGNOSIS — G8929 Other chronic pain: Secondary | ICD-10-CM

## 2019-01-14 DIAGNOSIS — Z87891 Personal history of nicotine dependence: Secondary | ICD-10-CM | POA: Insufficient documentation

## 2019-01-14 DIAGNOSIS — Z23 Encounter for immunization: Secondary | ICD-10-CM

## 2019-01-14 DIAGNOSIS — J4540 Moderate persistent asthma, uncomplicated: Secondary | ICD-10-CM

## 2019-01-14 MED ORDER — LORATADINE 10 MG PO TABS: 10.0000 mg | ORAL_TABLET | Freq: Every day | ORAL | 2 refills | Status: AC

## 2019-01-14 MED ORDER — ALBUTEROL SULFATE HFA 108 (90 BASE) MCG/ACT IN AERS: 2.0000 | INHALATION_SPRAY | Freq: Four times a day (QID) | RESPIRATORY_TRACT | 6 refills | Status: DC | PRN

## 2019-01-14 MED ORDER — MOMETASONE FURO-FORMOTEROL FUM 200-5 MCG/ACT IN AERO: 2.0000 | INHALATION_SPRAY | Freq: Two times a day (BID) | RESPIRATORY_TRACT | 6 refills | Status: DC

## 2019-01-14 NOTE — Progress Notes (Signed)
Virtual Visit via Telephone Note Due to current restrictions/limitations of in-office visits due to the COVID-19 pandemic, this scheduled clinical appointment was converted to a telehealth visit  I connected with Jacob Ho on 01/14/19 at 3:43 p.m by telephone and verified that I am speaking with the correct person using two identifiers. I am in my office.  The patient is at home.  Only the patient and myself participated in this encounter.  I discussed the limitations, risks, security and privacy concerns of performing an evaluation and management service by telephone and the availability of in person appointments. I also discussed with the patient that there may be a patient responsible charge related to this service. The patient expressed understanding and agreed to proceed.   History of Present Illness: Right knee pain with history of right-sided tibial plateau fracture 08/2018, asthma.  Patient was Ho seen by our physician assistant.  He is seeing me today to establish care for PCP   Chronic RT knee pain: got in with a pain specialist, Courtney Heys, since seeing PA.  Placed on Oxycodone 5 mg Q hrs PRN.  Has f/u in 02/2019.  Asthma: asthma usually worse late summer to early fall.  Reports compliance with Dulera BID.  Using Albuterol MDI 14-15 times a wk.  -also gets allergy symptoms including itching eyes, scratchy throat.  Not on any allergy meds -quit smoking in April 2020 -plans to get flu shot  Outpatient Encounter Medications as of 01/14/2019  Medication Sig  . albuterol (VENTOLIN HFA) 108 (90 Base) MCG/ACT inhaler Inhale 2 puffs into the lungs every 6 (six) hours as needed for wheezing or shortness of breath.  . Cholecalciferol (VITAMIN D) 125 MCG (5000 UT) CAPS Take 1 capsule by mouth daily.  Marland Kitchen docusate sodium (COLACE) 100 MG capsule Take 1 capsule (100 mg total) by mouth 2 (two) times daily.  . DULoxetine (CYMBALTA) 30 MG capsule Take 1 capsule (30 mg total) by mouth daily.  Prescribe 30 mg daily x 1 week then 60 mg daily- for nerve pain and knee pain  . methocarbamol (ROBAXIN) 750 MG tablet Take 1 tablet (750 mg total) by mouth every 8 (eight) hours as needed for muscle spasms.  . mometasone-formoterol (DULERA) 200-5 MCG/ACT AERO Inhale 2 puffs into the lungs 2 (two) times daily.  Marland Kitchen oxyCODONE (ROXICODONE) 5 MG immediate release tablet Take 1 tablet (5 mg total) by mouth every 6 (six) hours as needed for severe pain (recognize might be able to use less with nerve pain meds).  . vitamin C (VITAMIN C) 1000 MG tablet Take 1 tablet (1,000 mg total) by mouth daily.   No facility-administered encounter medications on file as of 01/14/2019.     Observations/Objective: No direct observation done as this was a telephone encounter  Assessment and Plan: 1. Moderate persistent asthma with allergic rhinitis without complication Not well controlled.  He does have some allergy symptoms so we will add Claritin.  Patient advised to let me know if asthma symptoms do not come under better control Once he starts the Claritin.  Continue Dulera  2. Former smoker Encouraged him to remain tobacco free  3. Need for influenza vaccination Encouraged him to get flu shot.  He states he will get it at his local [facility]  4. Chronic pain of right knee He is seeing a pain specialist   Follow Up Instructions: F/u in 4 mths   I discussed the assessment and treatment plan with the patient. The patient was provided an  opportunity to ask questions and all were answered. The patient agreed with the plan and demonstrated an understanding of the instructions.   The patient was advised to call back or seek an in-person evaluation if the symptoms worsen or if the condition fails to improve as anticipated.  I provided 9 minutes of non-face-to-face time during this encounter.   Jonah Blueeborah Kaleeya Hancock, MD

## 2019-01-29 ENCOUNTER — Telehealth: Payer: Self-pay | Admitting: Internal Medicine

## 2019-01-29 NOTE — Telephone Encounter (Signed)
LEFT VM to schedule follow up

## 2019-01-29 NOTE — Telephone Encounter (Signed)
-----   Message from Jackelyn Knife, [place] sent at 01/14/2019  4:04 PM EDT ----- Please contact pt and schedule a 4 month f/u

## 2019-02-08 MED FILL — !VENTOLIN HFA INHALER: 108 (90 BAS | 25 days supply | Qty: 18 | Fill #1

## 2019-02-08 MED FILL — DULERA 200 MCG/5 MCG INH: 200-5 | 30 days supply | Qty: 13 | Fill #1

## 2019-03-01 ENCOUNTER — Other Ambulatory Visit: Payer: Self-pay

## 2019-03-01 ENCOUNTER — Encounter: Payer: Self-pay | Attending: Physical Medicine and Rehabilitation | Admitting: Physical Medicine and Rehabilitation

## 2019-03-01 ENCOUNTER — Encounter: Payer: Self-pay | Admitting: Physical Medicine and Rehabilitation

## 2019-03-01 VITALS — BP 144/88 | HR 83 | Temp 98.6°F | Ht 69.0 in | Wt 194.4 lb

## 2019-03-01 DIAGNOSIS — Z7409 Other reduced mobility: Secondary | ICD-10-CM | POA: Insufficient documentation

## 2019-03-01 DIAGNOSIS — S82141H Displaced bicondylar fracture of right tibia, subsequent encounter for open fracture type I or II with delayed healing: Secondary | ICD-10-CM | POA: Insufficient documentation

## 2019-03-01 DIAGNOSIS — S82141S Displaced bicondylar fracture of right tibia, sequela: Secondary | ICD-10-CM

## 2019-03-01 DIAGNOSIS — Z5181 Encounter for therapeutic drug level monitoring: Secondary | ICD-10-CM | POA: Insufficient documentation

## 2019-03-01 DIAGNOSIS — G8921 Chronic pain due to trauma: Secondary | ICD-10-CM | POA: Insufficient documentation

## 2019-03-01 MED ORDER — DULOXETINE HCL 60 MG PO CPEP: 60.0000 mg | ORAL_CAPSULE | Freq: Every day | ORAL | 5 refills | Status: AC

## 2019-03-01 MED ORDER — DICLOFENAC SODIUM 75 MG PO TBEC: 75.0000 mg | DELAYED_RELEASE_TABLET | Freq: Two times a day (BID) | ORAL | 11 refills | Status: DC | PRN

## 2019-03-01 MED FILL — DULoxetine HCL 60 MG CPEP: 60 | 30 days supply | Qty: 30 | Fill #0

## 2019-03-01 MED FILL — DICLOFENAC SOD EC 75 MG TAB: 75 | 30 days supply | Qty: 60 | Fill #0

## 2019-03-01 NOTE — Patient Instructions (Signed)
1. Patient is a 43 yr old male with asthma and R tibial plateau fracture s/p external fixator and ORIF still wearing Bledsoe brace -not using crutch today  2. Impaired ADLs and mobility with chronic R knee pain and swelling- will try Diclofenac 75 mg 2x/day as needed- lasts 12 hours  3. Increase pain threshold- will con't Duloxetine 60 mg nightly- for nerve pain, swelling and knee pain.  4. Cannot write for any additional controlled substances until drug screen gets back.   5. Suggest he might be missing some of the paperwork for Medicaid. Since usually takes 90 days.

## 2019-03-01 NOTE — Progress Notes (Signed)
Subjective:    Patient ID: Jacob Ho, male    DOB: [birth year], 43 y.o.   MRN: [id number]  HPI    CC: R knee pain Patient is a 43 yr old male with R tibial plateau fracture in 08/2018 with chronic pain s/p 1 surgery- ORIF s/p external fixator  and needs another/TKR- here for f/u.  Has Dr. Marcelino Scot appointment Wednesday this week- hasn't seen him since seen here last.   Still wearing Bledsoe brace most of the time- except if relaxing on the couch/ or in bed, then has it off.    Exercise I showed him has been helpful to medial quad- does it at least every day.    Was nauseated with Duloxetine- but only 7 days and feels pain is better with Duloxetine.    Pain Inventory Average Pain 7 Pain Right Now 8 My pain is burning and aching  In the last 24 hours, has pain interfered with the following? General activity 7 Relation with others 8 Enjoyment of life 10 What TIME of day is your pain at its worst? morning and evening Sleep (in general) NA  Pain is worse with: walking and some activites Pain improves with: rest and medication Relief from Meds: 7  Mobility walk with assistance how many minutes can you walk? 7-10 ability to climb steps?  yes do you drive?  no  Function not employed: date last employed . I need assistance with the following:  household duties  Neuro/Psych trouble walking  Prior Studies Any changes since last visit?  no  Physicians involved in your care Any changes since last visit?  no   Family History  Problem Relation Age of Onset  . Diabetes Mother   . Asthma Mother   . Heart attack Father   . Asthma Sister    Social History   Socioeconomic History  . Marital status: Single    Spouse name: Not on file  . Number of children: Not on file  . Years of education: Not on file  . Highest education level: Not on file  Occupational History  . Not on file  Social Needs  . Financial resource strain: Not on file  . Food insecurity   Worry: Not on file    Inability: Not on file  . Transportation needs    Medical: Not on file    Non-medical: Not on file  Tobacco Use  . Smoking status: Former Smoker    Start date: 05/21/1991    Quit date: 09/09/2013    Years since quitting: 5.4  . Smokeless tobacco: Never Used  . Tobacco comment: smoking black and milds 1 a day  Substance and Sexual Activity  . Alcohol use: No  . Drug use: Yes    Comment: marijuana occasionally  . Sexual activity: Yes    Partners: Female  Lifestyle  . Physical activity    Days per week: Not on file    Minutes per session: Not on file  . Stress: Not on file  Relationships  . Social Herbalist on phone: Not on file    Gets together: Not on file    Attends religious service: Not on file    Active member of club or organization: Not on file    Attends meetings of clubs or organizations: Not on file    Relationship status: Not on file  Other Topics Concern  . Not on file  Social History Narrative  . Not on file  Past Surgical History:  Procedure Laterality Date  . EXTERNAL FIXATION LEG Right 09/01/2018   Procedure: EXTERNAL FIXATION LEG;  Surgeon: Myrene Galas, MD;  Location: [facility] OR;  Service: Orthopedics;  Laterality: Right;  . EXTERNAL FIXATION REMOVAL Right 09/04/2018   Procedure: REMOVAL EXTERNAL FIXATION LEG;  Surgeon: Myrene Galas, MD;  Location[facility] OR;  Service: Orthopedics;  Laterality: Right;  . FINE NEEDLE ASPIRATION Right 09/01/2018   Procedure: Fine Needle Aspiration;  Surgeon: Myrene Galas, MD;  Location[facility] OR;  Service: Orthopedics;  Laterality: Right;  . ORIF TIBIA PLATEAU Right 09/04/2018   Procedure: OPEN REDUCTION INTERNAL FIXATION (ORIF) TIBIAL PLATEAU;  Surgeon: Myrene Galas, MD;  Location: [facility] OR;  Service: Orthopedics;  Laterality: Right;   Past Medical History:  Diagnosis Date  . Asthma   . Vitamin D deficiency 09/06/2018   BP (!) 144/88   Pulse 83   Temp 98.6 F (37 C)   Ht 5\' 9"  (1.753 m)   Wt  194 lb 6.4 oz (88.2 kg)   SpO2 98%   BMI 28.71 kg/m   Opioid Risk Score:   Fall Risk Score:  `1  Depression screen PHQ 2/9  Depression screen [facility] 2/9 03/01/2019 12/29/2018 12/03/2018  Decreased Interest 2 2 1   Down, Depressed, Hopeless 2 2 1   PHQ - 2 Score 4 4 2   Altered sleeping - 3 1  Tired, decreased energy - 2 1  Change in appetite - 2 0  Feeling bad or failure about yourself  - 2 -  Trouble concentrating - 2 1  Moving slowly or fidgety/restless - 1 1  Suicidal thoughts - 0 0  PHQ-9 Score - 16 6  Difficult doing work/chores - Very difficult -   Review of Systems  Constitutional: Negative.   HENT: Negative.   Eyes: Negative.   Respiratory: Negative.   Cardiovascular: Negative.   Gastrointestinal: Negative.   Endocrine: Negative.   Genitourinary: Negative.   Musculoskeletal: Positive for gait problem.  Skin: Negative.   Allergic/Immunologic: Negative.   Hematological: Negative.   Psychiatric/Behavioral: Positive for dysphoric mood.  All other systems reviewed and are negative.      Objective:   Physical Exam Awake, alert, appropriate, smells of cigarette smoke, wearing RLE Bledsoe brace, NAD  Severe medial quad atrophy- if anything worse than last visit. Mild to moderate effusion of R knee- patella appears to actually have hypermobility- with ROM of patella TTP over medial patella and tibial plateau on R knee No external joint swelling seen      Assessment & Plan:   1. Patient is a 43 yr old male with asthma and R tibial plateau fracture s/p external fixator and ORIF still wearing Bledsoe brace -not using crutch today  2. Impaired ADLs and mobility with chronic R knee pain and swelling- will try Diclofenac 75 mg 2x/day as needed- lasts 12 hours  3. Increase pain threshold- will con't Duloxetine 60 mg nightly- for nerve pain, swelling and knee pain.  4. Cannot write for any additional controlled substances until drug screen gets back.   5. Suggest he  might be missing some of the paperwork for Medicaid. Since usually takes 90 days.  6. F/U 3-4 months.   I spent a total of 25 minutes on appointment- more than 15 minutes educating on medicaid, swelling, and Duloxetine use.

## 2019-03-04 ENCOUNTER — Telehealth: Payer: Self-pay | Admitting: *Deleted

## 2019-03-04 LAB — DRUG TOX MONITOR 1 W/CONF, ORAL FLD
Amphetamines: NEGATIVE ng/mL (ref <10)
Barbiturates: NEGATIVE ng/mL (ref <10)
Benzodiazepines: NEGATIVE ng/mL (ref <0.50)
Buprenorphine: NEGATIVE ng/mL (ref <0.10)
Cocaine: NEGATIVE ng/mL (ref <5.0)
Cotinine: 32.9 ng/mL — ABNORMAL HIGH (ref <5.0)
Fentanyl: NEGATIVE ng/mL (ref <0.10)
Heroin Metabolite: NEGATIVE ng/mL (ref <1.0)
MARIJUANA: POSITIVE ng/mL — AB (ref <2.5)
MDMA: NEGATIVE ng/mL (ref <10)
Meprobamate: NEGATIVE ng/mL (ref <2.5)
Methadone: NEGATIVE ng/mL (ref <5.0)
Nicotine Metabolite: POSITIVE ng/mL — AB (ref <5.0)
Opiates: NEGATIVE ng/mL (ref <2.5)
Phencyclidine: NEGATIVE ng/mL (ref <10)
THC: 54.8 ng/mL — ABNORMAL HIGH (ref <2.5)
Tapentadol: NEGATIVE ng/mL (ref <5.0)
Tramadol: NEGATIVE ng/mL (ref <5.0)
Zolpidem: NEGATIVE ng/mL (ref <5.0)

## 2019-03-04 LAB — DRUG TOX ALC METAB W/CON, ORAL FLD: Alcohol Metabolite: NEGATIVE ng/mL (ref <25)

## 2019-03-04 NOTE — Telephone Encounter (Signed)
Is not back - will take 4-7 days to return

## 2019-03-04 NOTE — Telephone Encounter (Signed)
Patient is  calling about his drug screen result.  He is hoping it has been completed so he can receive his pain medication.

## 2019-03-05 ENCOUNTER — Telehealth: Payer: Self-pay | Admitting: *Deleted

## 2019-03-05 NOTE — Telephone Encounter (Signed)
Results have come back

## 2019-03-05 NOTE — Telephone Encounter (Signed)
Oral swab drug screen was positive for marijuana and its metabolite THC.

## 2019-03-05 NOTE — Telephone Encounter (Signed)
Yes- drug screen is back and is (+) for marijuana- per [facility] requirements, I am not allowed to prescribe pain meds when it's (+) for any illicit drugs.

## 2019-03-10 MED FILL — DULERA 200 MCG/5 MCG INH: 200-5 | 30 days supply | Qty: 13 | Fill #2

## 2019-03-17 NOTE — Telephone Encounter (Signed)
Patient left a message asking for refills on oxycodone. I contacted patient and it went to voicemail.  I left a message to   Have him call our clinic back.

## 2019-03-17 NOTE — Telephone Encounter (Signed)
I spoke with the patient about his UDS results.  He states that he stopped smoking marijuana knowing that he had his appointment coming up.  He knows that it stays in your system for quite sometime.  He says he was upfront about this with the provider.  He is dealing with a great deal of pain and is wondering what can be done.  He is open to testing again to show that he has stopped.  He is asking for a chance to prove that he is not doing that anymore.  In the meantime he is asking what can be done for his pain

## 2019-03-29 ENCOUNTER — Emergency Department (HOSPITAL_COMMUNITY)
Admission: EM | Admit: 2019-03-29 | Discharge: 2019-03-29 | Disposition: A | Discharge status: Home / Self Care | Payer: Self-pay | Attending: Emergency Medicine | Admitting: Emergency Medicine

## 2019-03-29 ENCOUNTER — Encounter (HOSPITAL_COMMUNITY): Payer: Self-pay | Admitting: Emergency Medicine

## 2019-03-29 ENCOUNTER — Emergency Department (HOSPITAL_COMMUNITY): Payer: Self-pay

## 2019-03-29 DIAGNOSIS — J4520 Mild intermittent asthma, uncomplicated: Secondary | ICD-10-CM | POA: Insufficient documentation

## 2019-03-29 DIAGNOSIS — Z79899 Other long term (current) drug therapy: Secondary | ICD-10-CM | POA: Insufficient documentation

## 2019-03-29 DIAGNOSIS — J45909 Unspecified asthma, uncomplicated: Secondary | ICD-10-CM | POA: Insufficient documentation

## 2019-03-29 DIAGNOSIS — F1210 Cannabis abuse, uncomplicated: Secondary | ICD-10-CM | POA: Insufficient documentation

## 2019-03-29 DIAGNOSIS — F17210 Nicotine dependence, cigarettes, uncomplicated: Secondary | ICD-10-CM | POA: Insufficient documentation

## 2019-03-29 LAB — COMPREHENSIVE METABOLIC PANEL
ALT: 20 U/L (ref 0–44)
AST: 16 U/L (ref 15–41)
Albumin: 3.6 g/dL (ref 3.5–5.0)
Alkaline Phosphatase: 79 U/L (ref 38–126)
Anion gap: 9 (ref 5–15)
BUN: 12 mg/dL (ref 6–20)
CO2: 23 mmol/L (ref 22–32)
Calcium: 8.8 mg/dL — ABNORMAL LOW (ref 8.9–10.3)
Chloride: 106 mmol/L (ref 98–111)
Creatinine, Ser: 1.28 mg/dL — ABNORMAL HIGH (ref 0.61–1.24)
GFR calc Af Amer: >60 mL/min (ref >60)
GFR calc non Af Amer: >60 mL/min (ref >60)
Glucose, Bld: 101 mg/dL — ABNORMAL HIGH (ref 70–99)
Potassium: 4 mmol/L (ref 3.5–5.1)
Sodium: 138 mmol/L (ref 135–145)
Total Bilirubin: 0.7 mg/dL (ref 0.3–1.2)
Total Protein: 6.4 g/dL — ABNORMAL LOW (ref 6.5–8.1)

## 2019-03-29 LAB — CBC
HCT: 47.8 % (ref 39.0–52.0)
Hemoglobin: 16.1 g/dL (ref 13.0–17.0)
MCH: 30.3 pg (ref 26.0–34.0)
MCHC: 33.7 g/dL (ref 30.0–36.0)
MCV: 90 fL (ref 80.0–100.0)
Platelets: 273 10*3/uL (ref 150–400)
RBC: 5.31 MIL/uL (ref 4.22–5.81)
RDW: 13.6 % (ref 11.5–15.5)
WBC: 9.3 10*3/uL (ref 4.0–10.5)
nRBC: 0 % (ref 0.0–0.2)

## 2019-03-29 MED ORDER — ALBUTEROL SULFATE HFA 108 (90 BASE) MCG/ACT IN AERS
2.0000 | INHALATION_SPRAY | Freq: Once | RESPIRATORY_TRACT | Status: DC
  Filled 2019-03-29: qty 6.7

## 2019-03-29 MED ORDER — MOMETASONE FURO-FORMOTEROL FUM 200-5 MCG/ACT IN AERO
2.0000 | INHALATION_SPRAY | Freq: Once | RESPIRATORY_TRACT | Status: AC
  Administered 2019-03-29: 2 via RESPIRATORY_TRACT
  Filled 2019-03-29: qty 8.8

## 2019-03-29 NOTE — ED Provider Notes (Addendum)
[name] EMERGENCY DEPARTMENT Provider Note   CSN: [id number] Arrival date & time: 03/29/19  4010     History   Chief Complaint No chief complaint on file.   HPI Jacob Ho is a 43 y.o. male.     HPI   Jacob Ho is a 43 y.o. male, with a history of asthma, presenting to the ED with wheezing and shortness of breath over the last several days.  He states symptoms are consistent with an asthma exacerbation.  He has been out of his Dulera and albuterol inhalers for the last 3 days. He has an appointment with his PCP November 18. Patient was given an albuterol inhaler here in the ED prior to my evaluation of him and he states his symptoms have resolved. Denies fever/chills, chest pain, cough, N/V/D, abdominal pain, dizziness, syncope, or any other complaints.  Past Medical History:  Diagnosis Date  . Asthma   . Vitamin D deficiency 09/06/2018    Patient Active Problem List   Diagnosis Date Noted  . Encounter for therapeutic drug monitoring 03/01/2019  . Former smoker 01/14/2019  . Moderate persistent asthma with allergic rhinitis without complication 01/14/2019  . Vitamin D deficiency 09/06/2018  . Tibial plateau fracture 08/31/2018    Past Surgical History:  Procedure Laterality Date  . EXTERNAL FIXATION LEG Right 09/01/2018   Procedure: EXTERNAL FIXATION LEG;  Surgeon: Myrene Galas, MD;  Location: [facility] OR;  Service: Orthopedics;  Laterality: Right;  . EXTERNAL FIXATION REMOVAL Right 09/04/2018   Procedure: REMOVAL EXTERNAL FIXATION LEG;  Surgeon: Myrene Galas, MD;  Location: [facility] OR;  Service: Orthopedics;  Laterality: Right;  . FINE NEEDLE ASPIRATION Right 09/01/2018   Procedure: Fine Needle Aspiration;  Surgeon: Myrene Galas, MD;  Location: [facility] OR;  Service: Orthopedics;  Laterality: Right;  . ORIF TIBIA PLATEAU Right 09/04/2018   Procedure: OPEN REDUCTION INTERNAL FIXATION (ORIF) TIBIAL PLATEAU;  Surgeon: Myrene Galas, MD;  Location: [facility]  OR;  Service: Orthopedics;  Laterality: Right;        Home Medications    Prior to Admission medications   Medication Sig Start Date End Date Taking? Authorizing Provider  albuterol (VENTOLIN HFA) 108 (90 Base) MCG/ACT inhaler Inhale 2 puffs into the lungs every 6 (six) hours as needed for wheezing or shortness of breath. 01/14/19   Marcine Matar, MD  Cholecalciferol (VITAMIN D) 125 MCG (5000 UT) CAPS Take 1 capsule by mouth daily. 09/09/18   Allayne Stack, DO  diclofenac (VOLTAREN) 75 MG EC tablet Take 1 tablet (75 mg total) by mouth 2 (two) times daily as needed. 03/01/19   Lovorn, Aundra Millet, MD  docusate sodium (COLACE) 100 MG capsule Take 1 capsule (100 mg total) by mouth 2 (two) times daily. 09/08/18   Montez Morita, PA-C  DULoxetine (CYMBALTA) 60 MG capsule Take 1 capsule (60 mg total) by mouth daily. 03/01/19 02/29/20  Lovorn, Aundra Millet, MD  loratadine (CLARITIN) 10 MG tablet Take 1 tablet (10 mg total) by mouth daily. 01/14/19   Marcine Matar, MD  methocarbamol (ROBAXIN) 750 MG tablet Take 1 tablet (750 mg total) by mouth every 8 (eight) hours as needed for muscle spasms. 09/09/18   Allayne Stack, DO  mometasone-formoterol (DULERA) 200-5 MCG/ACT AERO Inhale 2 puffs into the lungs 2 (two) times daily. 01/14/19   Marcine Matar, MD  vitamin C (VITAMIN C) 1000 MG tablet Take 1 tablet (1,000 mg total) by mouth daily. 09/09/18   Montez Morita, PA-C    Family  History Family History  Problem Relation Age of Onset  . Diabetes Mother   . Asthma Mother   . Heart attack Father   . Asthma Sister     Social History Social History   Tobacco Use  . Smoking status: Current Every Day Smoker    Packs/day: 0.20    Types: Cigarettes    Start date: 05/21/1991  . Smokeless tobacco: Never Used  . Tobacco comment: smoking black and milds 1 a day  Substance Use Topics  . Alcohol use: No  . Drug use: Yes    Comment: marijuana occasionally     Allergies   Iodine and Shellfish allergy    Review of Systems Review of Systems  Constitutional: Negative for chills, diaphoresis and fever.  Respiratory: Positive for shortness of breath. Negative for cough.   Cardiovascular: Negative for chest pain and leg swelling.  Gastrointestinal: Negative for abdominal pain, diarrhea, nausea and vomiting.  Neurological: Negative for dizziness, syncope, weakness and light-headedness.  All other systems reviewed and are negative.    Physical Exam Updated Vital Signs BP (!) 136/98 (BP Location: Right Arm)   Pulse 71   Temp 98.5 F (36.9 C) (Oral)   Resp 15   SpO2 100%   Physical Exam Vitals signs and nursing note reviewed.  Constitutional:      General: He is not in acute distress.    Appearance: He is well-developed. He is not diaphoretic.  HENT:     Head: Normocephalic and atraumatic.     Mouth/Throat:     Mouth: Mucous membranes are moist.     Pharynx: Oropharynx is clear.  Eyes:     Conjunctiva/sclera: Conjunctivae normal.  Neck:     Musculoskeletal: Neck supple.  Cardiovascular:     Rate and Rhythm: Normal rate and regular rhythm.     Pulses: Normal pulses.          Radial pulses are 2+ on the right side and 2+ on the left side.       Posterior tibial pulses are 2+ on the right side and 2+ on the left side.     Heart sounds: Normal heart sounds.     Comments: Tactile temperature in the extremities appropriate and equal bilaterally. Pulmonary:     Effort: Pulmonary effort is normal. No respiratory distress.     Breath sounds: Wheezing present.     Comments: No increased work of breathing.  Speaks in multiple sentences with ease of breath. Great air movement.  Possibly very faint expiratory wheezing. Abdominal:     Palpations: Abdomen is soft.     Tenderness: There is no abdominal tenderness. There is no guarding.  Musculoskeletal:     Right lower leg: No edema.     Left lower leg: No edema.  Lymphadenopathy:     Cervical: No cervical adenopathy.  Skin:     General: Skin is warm and dry.  Neurological:     Mental Status: He is alert.  Psychiatric:        Mood and Affect: Mood and affect normal.        Speech: Speech normal.        Behavior: Behavior normal.      ED Treatments / Results  Labs (all labs ordered are listed, but only abnormal results are displayed) Labs Reviewed  COMPREHENSIVE METABOLIC PANEL - Abnormal; Notable for the following components:      Result Value   Glucose, Bld 101 (*)    Creatinine, Ser 1.28 (*)  Calcium 8.8 (*)    Total Protein 6.4 (*)    All other components within normal limits  CBC    EKG EKG Interpretation  Date/Time:  Monday March 29 2019 09:09:18 EST Ventricular Rate:  86 PR Interval:  132 QRS Duration: 64 QT Interval:  376 QTC Calculation: 449 R Axis:   27 Text Interpretation: Normal sinus rhythm Normal ECG No STEMI Confirmed by Alvester Chourifan, Matthew [phone]([id number]) on 03/29/2019 10:47:05 AM   Radiology Dg Chest 2 View  Result Date: 03/29/2019 CLINICAL DATA:  43 year old male with shortness of breath. EXAM: CHEST - 2 VIEW COMPARISON:  Chest radiograph dated 07/09/2017 FINDINGS: The heart size and mediastinal contours are within normal limits. Both lungs are clear. The visualized skeletal structures are unremarkable. IMPRESSION: No active cardiopulmonary disease. Electronically Signed   By: Elgie CollardArash  Radparvar M.D.   On: 03/29/2019 09:36    Procedures Procedures (including critical care time)  Medications Ordered in ED Medications  mometasone-formoterol (DULERA) 200-5 MCG/ACT inhaler 2 puff (2 puffs Inhalation Given 03/29/19 1137)     Initial Impression / Assessment and Plan / ED Course  I have reviewed the triage vital signs and the nursing notes.  Pertinent labs & imaging results that were available during my care of the patient were reviewed by me and considered in my medical decision making (see chart for details).        Patient presents with intermittent shortness of breath after  running out of his Dulera and albuterol inhalers.  He had resolution in his symptoms prior to my evaluation. Slight elevation in creatinine, may be due to some slight dehydration.  Patient has a preference towards oral hydration. He has PCP follow-up already scheduled. Oral steroids were considered, but since patient had resolution in symptoms with reinitiating his albuterol inhaler and the fact that we will be providing Dulera inhaler for him, we will not initiate oral steroids at this time. The patient was given instructions for home care as well as return precautions. Patient voices understanding of these instructions, accepts the plan, and is comfortable with discharge. Patient noted to have 6 refills of his albuterol inhaler and his Dulera.  He states he has some financial concerns.  Therefore, we provided both of these inhalers here in the ED.  Findings and plan of care discussed with Alvester ChouMatthew Trifan, MD.   Final Clinical Impressions(s) / ED Diagnoses   Final diagnoses:  Intermittent asthma without complication, unspecified asthma severity    ED Discharge Orders    None       Concepcion LivingJoy, Julita Ozbun C, PA-C 03/29/19 1135    Anselm PancoastJoy, Gabryel Talamo C, PA-C 03/30/19 2216    Terald Sleeperrifan, Matthew J, MD 03/31/19 1212

## 2019-03-29 NOTE — ED Triage Notes (Signed)
Pt  Here from home with co sob , no chest pain , pt ran out of his inhaler 2 days ago , states that it was helping before he ran out , pt is a smoker

## 2019-03-29 NOTE — Discharge Instructions (Signed)
Restart using the [facility] daily and the albuterol inhaler as needed. Return to the emergency department for shortness of breath, chest pain, abdominal pain, or any other major concerns.

## 2019-04-09 ENCOUNTER — Ambulatory Visit: Payer: Self-pay

## 2019-06-02 ENCOUNTER — Encounter: Payer: Self-pay | Attending: Physical Medicine and Rehabilitation | Admitting: Physical Medicine and Rehabilitation

## 2019-06-02 DIAGNOSIS — S82141H Displaced bicondylar fracture of right tibia, subsequent encounter for open fracture type I or II with delayed healing: Secondary | ICD-10-CM | POA: Insufficient documentation

## 2019-06-02 DIAGNOSIS — Z5181 Encounter for therapeutic drug level monitoring: Secondary | ICD-10-CM | POA: Insufficient documentation

## 2019-06-02 DIAGNOSIS — G8921 Chronic pain due to trauma: Secondary | ICD-10-CM | POA: Insufficient documentation

## 2019-06-02 DIAGNOSIS — Z7409 Other reduced mobility: Secondary | ICD-10-CM | POA: Insufficient documentation

## 2019-06-29 ENCOUNTER — Ambulatory Visit: Payer: Medicaid Other | Attending: General Surgery | Admitting: General Surgery

## 2019-06-29 ENCOUNTER — Encounter: Payer: Self-pay | Admitting: General Surgery

## 2019-06-29 ENCOUNTER — Other Ambulatory Visit: Payer: Self-pay

## 2019-06-29 VITALS — BP 149/98 | HR 90 | Ht 70.0 in | Wt 189.6 lb

## 2019-06-29 DIAGNOSIS — J4540 Moderate persistent asthma, uncomplicated: Secondary | ICD-10-CM | POA: Diagnosis not present

## 2019-06-29 DIAGNOSIS — M25561 Pain in right knee: Secondary | ICD-10-CM | POA: Diagnosis not present

## 2019-06-29 DIAGNOSIS — G8929 Other chronic pain: Secondary | ICD-10-CM

## 2019-06-29 MED ORDER — TRAMADOL HCL 50 MG PO TABS: 50.0000 mg | ORAL_TABLET | Freq: Four times a day (QID) | ORAL | 0 refills | Status: AC | PRN

## 2019-06-29 MED ORDER — ALBUTEROL SULFATE HFA 108 (90 BASE) MCG/ACT IN AERS: 2.0000 | INHALATION_SPRAY | Freq: Four times a day (QID) | RESPIRATORY_TRACT | 6 refills | Status: AC | PRN

## 2019-06-29 MED ORDER — MOMETASONE FURO-FORMOTEROL FUM 200-5 MCG/ACT IN AERO: 2.0000 | INHALATION_SPRAY | Freq: Two times a day (BID) | RESPIRATORY_TRACT | 6 refills | Status: AC

## 2019-06-29 MED ORDER — DICLOFENAC SODIUM 75 MG PO TBEC: 75.0000 mg | DELAYED_RELEASE_TABLET | Freq: Two times a day (BID) | ORAL | 2 refills | Status: AC | PRN

## 2019-06-29 NOTE — Progress Notes (Signed)
Patient is having right knee pain.  Wanting a referral to pain clinic.  Currently not taking any medications.

## 2019-06-29 NOTE — Progress Notes (Signed)
Established Patient Office Visit  Subjective:  Patient ID: Jacob Ho, male    DOB: 1976-01-20  Age: 44 y.o. MRN: [id number]  CC:  Chief Complaint  Patient presents with  . Knee Pain    HPI Jacob Ho presents for Right Knee pain Past Medical History:  Diagnosis Date  . Asthma   . Vitamin D deficiency 09/06/2018    Past Surgical History:  Procedure Laterality Date  . EXTERNAL FIXATION LEG Right 09/01/2018   Procedure: EXTERNAL FIXATION LEG;  Surgeon: Myrene Galas, MD;  Location: [facility] OR;  Service: Orthopedics;  Laterality: Right;  . EXTERNAL FIXATION REMOVAL Right 09/04/2018   Procedure: REMOVAL EXTERNAL FIXATION LEG;  Surgeon: Myrene Galas, MD;  Location: [facility] OR;  Service: Orthopedics;  Laterality: Right;  . FINE NEEDLE ASPIRATION Right 09/01/2018   Procedure: Fine Needle Aspiration;  Surgeon: Myrene Galas, MD;  Location: [facility] OR;  Service: Orthopedics;  Laterality: Right;  . ORIF TIBIA PLATEAU Right 09/04/2018   Procedure: OPEN REDUCTION INTERNAL FIXATION (ORIF) TIBIAL PLATEAU;  Surgeon: Myrene Galas, MD;  Location: [facility] OR;  Service: Orthopedics;  Laterality: Right;    Family History  Problem Relation Age of Onset  . Diabetes Mother   . Asthma Mother   . Heart attack Father   . Asthma Sister     Social History   Socioeconomic History  . Marital status: Single    Spouse name: Not on file  . Number of children: Not on file  . Years of education: Not on file  . Highest education level: Not on file  Occupational History  . Not on file  Tobacco Use  . Smoking status: Current Every Day Smoker    Packs/day: 0.20    Types: Cigarettes    Start date: 05/21/1991  . Smokeless tobacco: Never Used  . Tobacco comment: smoking black and milds 1 a day  Substance and Sexual Activity  . Alcohol use: No  . Drug use: Yes    Comment: marijuana occasionally  . Sexual activity: Yes    Partners: Female  Other Topics Concern  . Not on file  Social History Narrative  .  Not on file   Social Determinants of Health   Financial Resource Strain:   . Difficulty of Paying Living Expenses: Not on file  Food Insecurity:   . Worried About Programme researcher, broadcasting/film/video in the Last Year: Not on file  . Ran Out of Food in the Last Year: Not on file  Transportation Needs:   . Lack of Transportation (Medical): Not on file  . Lack of Transportation (Non-Medical): Not on file  Physical Activity:   . Days of Exercise per Week: Not on file  . Minutes of Exercise per Session: Not on file  Stress:   . Feeling of Stress : Not on file  Social Connections:   . Frequency of Communication with Friends and Family: Not on file  . Frequency of Social Gatherings with Friends and Family: Not on file  . Attends Religious Services: Not on file  . Active Member of Clubs or Organizations: Not on file  . Attends Banker Meetings: Not on file  . Marital Status: Not on file  Intimate Partner Violence:   . Fear of Current or Ex-Partner: Not on file  . Emotionally Abused: Not on file  . Physically Abused: Not on file  . Sexually Abused: Not on file    Outpatient Medications Prior to Visit  Medication Sig Dispense Refill  . Cholecalciferol (  VITAMIN D) 125 MCG (5000 UT) CAPS Take 1 capsule by mouth daily. (Patient not taking: Reported on 06/29/2019) 30 capsule 0  . docusate sodium (COLACE) 100 MG capsule Take 1 capsule (100 mg total) by mouth 2 (two) times daily. (Patient not taking: Reported on 06/29/2019) 20 capsule 0  . DULoxetine (CYMBALTA) 60 MG capsule Take 1 capsule (60 mg total) by mouth daily. (Patient not taking: Reported on 06/29/2019) 30 capsule 5  . loratadine (CLARITIN) 10 MG tablet Take 1 tablet (10 mg total) by mouth daily. (Patient not taking: Reported on 06/29/2019) 30 tablet 2  . methocarbamol (ROBAXIN) 750 MG tablet Take 1 tablet (750 mg total) by mouth every 8 (eight) hours as needed for muscle spasms. (Patient not taking: Reported on 06/29/2019) 30 tablet 0  . vitamin C  (VITAMIN C) 1000 MG tablet Take 1 tablet (1,000 mg total) by mouth daily. (Patient not taking: Reported on 06/29/2019) 60 tablet 0  . albuterol (VENTOLIN HFA) 108 (90 Base) MCG/ACT inhaler Inhale 2 puffs into the lungs every 6 (six) hours as needed for wheezing or shortness of breath. (Patient not taking: Reported on 06/29/2019) 18 g 6  . diclofenac (VOLTAREN) 75 MG EC tablet Take 1 tablet (75 mg total) by mouth 2 (two) times daily as needed. (Patient not taking: Reported on 06/29/2019) 60 tablet 11  . mometasone-formoterol (DULERA) 200-5 MCG/ACT AERO Inhale 2 puffs into the lungs 2 (two) times daily. (Patient not taking: Reported on 06/29/2019) 13 g 6   No facility-administered medications prior to visit.    Allergies  Allergen Reactions  . Iodine Anaphylaxis    UNSPECIFIED REACTION   . Shellfish Allergy Anaphylaxis    ROS Review of Systems  Respiratory: Positive for shortness of breath (Very mild, has been off normal meds).   Cardiovascular: Positive for palpitations.  Gastrointestinal: Negative.   Musculoskeletal: Positive for joint swelling (Mild right knee swelling, warm).      Objective:    Physical Exam  Pulmonary/Chest: Breath sounds normal.  Musculoskeletal:     Right knee: Swelling (very mild) present. Decreased range of motion. Tenderness (Mostly medially) present.       Legs:    BP (!) 149/98   Pulse 90   Ht 5\' 10"  (1.778 m)   Wt 189 lb 9.6 oz (86 kg)   SpO2 99%   BMI 27.20 kg/m  Wt Readings from Last 3 Encounters:  06/29/19 189 lb 9.6 oz (86 kg)  03/01/19 194 lb 6.4 oz (88.2 kg)  12/29/18 180 lb (81.6 kg)     Health Maintenance Due  Topic Date Due  . TETANUS/TDAP  09/20/1992  . INFLUENZA VACCINE  12/19/2018    There are no preventive care reminders to display for this patient.  Lab Results  Component Value Date   TSH 0.238 (L) 04/12/2014   Lab Results  Component Value Date   WBC 9.3 03/29/2019   HGB 16.1 03/29/2019   HCT 47.8 03/29/2019   MCV 90.0  03/29/2019   PLT 273 03/29/2019   Lab Results  Component Value Date   NA 138 03/29/2019   K 4.0 03/29/2019   CO2 23 03/29/2019   GLUCOSE 101 (H) 03/29/2019   BUN 12 03/29/2019   CREATININE 1.28 (H) 03/29/2019   BILITOT 0.7 03/29/2019   ALKPHOS 79 03/29/2019   AST 16 03/29/2019   ALT 20 03/29/2019   PROT 6.4 (L) 03/29/2019   ALBUMIN 3.6 03/29/2019   CALCIUM 8.8 (L) 03/29/2019   ANIONGAP 9 03/29/2019  Lab Results  Component Value Date   CHOL 213 (H) 09/09/2018   Lab Results  Component Value Date   HDL 50 09/09/2018   Lab Results  Component Value Date   LDLCALC 152 (H) 09/09/2018   Lab Results  Component Value Date   TRIG 55 09/09/2018   Lab Results  Component Value Date   CHOLHDL 4.3 09/09/2018   Lab Results  Component Value Date   HGBA1C 5.1 09/02/2018      Assessment & Plan:  1. Moderate persistent asthma with allergic rhinitis without complication Has run otu of medications and needs refills - albuterol (VENTOLIN HFA) 108 (90 Base) MCG/ACT inhaler; Inhale 2 puffs into the lungs every 6 (six) hours as needed for wheezing or shortness of breath.  Dispense: 18 g; Refill: 6 - mometasone-formoterol (DULERA) 200-5 MCG/ACT AERO; Inhale 2 puffs into the lungs 2 (two) times daily.  Dispense: 13 g; Refill: 6  2. Chronic pain of right knee Related to previous right knee surgery in April 2020.  Has appoi - diclofenac (VOLTAREN) 75 MG EC tablet; Take 1 tablet (75 mg total) by mouth 2 (two) times daily as needed.  Dispense: 60 tablet; Refill: 2 - Ambulatory referral to Pain Clinic - traMADol (ULTRAM) 50 MG tablet; Take 1-2 tablets (50-100 mg total) by mouth every 6 (six) hours as needed for moderate pain or severe pain.  Dispense: 30 tablet; Refill: 0  Meds ordered this encounter  Medications  . albuterol (VENTOLIN HFA) 108 (90 Base) MCG/ACT inhaler    Sig: Inhale 2 puffs into the lungs every 6 (six) hours as needed for wheezing or shortness of breath.    Dispense:   18 g    Refill:  6  . mometasone-formoterol (DULERA) 200-5 MCG/ACT AERO    Sig: Inhale 2 puffs into the lungs 2 (two) times daily.    Dispense:  13 g    Refill:  6  . diclofenac (VOLTAREN) 75 MG EC tablet    Sig: Take 1 tablet (75 mg total) by mouth 2 (two) times daily as needed.    Dispense:  60 tablet    Refill:  2  . traMADol (ULTRAM) 50 MG tablet    Sig: Take 1-2 tablets (50-100 mg total) by mouth every 6 (six) hours as needed for moderate pain or severe pain.    Dispense:  30 tablet    Refill:  0    Follow-up: Return in about 3 months (around 09/26/2019) for Follow chronic pain and respiratory condition.    Jimmye Norman, MD

## 2019-06-29 NOTE — Patient Instructions (Signed)
You will get a referral to [facility] for chronic right knee pain  Keep appointment with Dr. Carola Frost (orthopedic surgeon) tomorrow.  Your breathing medications have been sent to your pharmacy [facility] and [facility])  Will see you again in 3 months for follow up of respiratory condions and pain management

## 2019-10-14 ENCOUNTER — Other Ambulatory Visit: Payer: Self-pay

## 2019-10-14 ENCOUNTER — Emergency Department (HOSPITAL_COMMUNITY)
Admission: EM | Admit: 2019-10-14 | Discharge: 2019-10-14 | Disposition: A | Discharge status: Home / Self Care | Payer: Medicaid Other | Attending: Emergency Medicine | Admitting: Emergency Medicine

## 2019-10-14 DIAGNOSIS — Z5321 Procedure and treatment not carried out due to patient leaving prior to being seen by health care provider: Secondary | ICD-10-CM | POA: Insufficient documentation

## 2019-10-14 DIAGNOSIS — J45909 Unspecified asthma, uncomplicated: Secondary | ICD-10-CM | POA: Diagnosis present

## 2019-10-14 NOTE — ED Notes (Signed)
Called pts name 5x to take back to room with no response.
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
## 2020-06-02 IMAGING — DX RIGHT KNEE - COMPLETE 4+ VIEW
3 series · 3 of 3 positions shown · non-contrast
Comparison: None.

CLINICAL DATA: 42-year-old male with a jumping injury

EXAM:
RIGHT KNEE - COMPLETE 4+ VIEW

[knee ap]
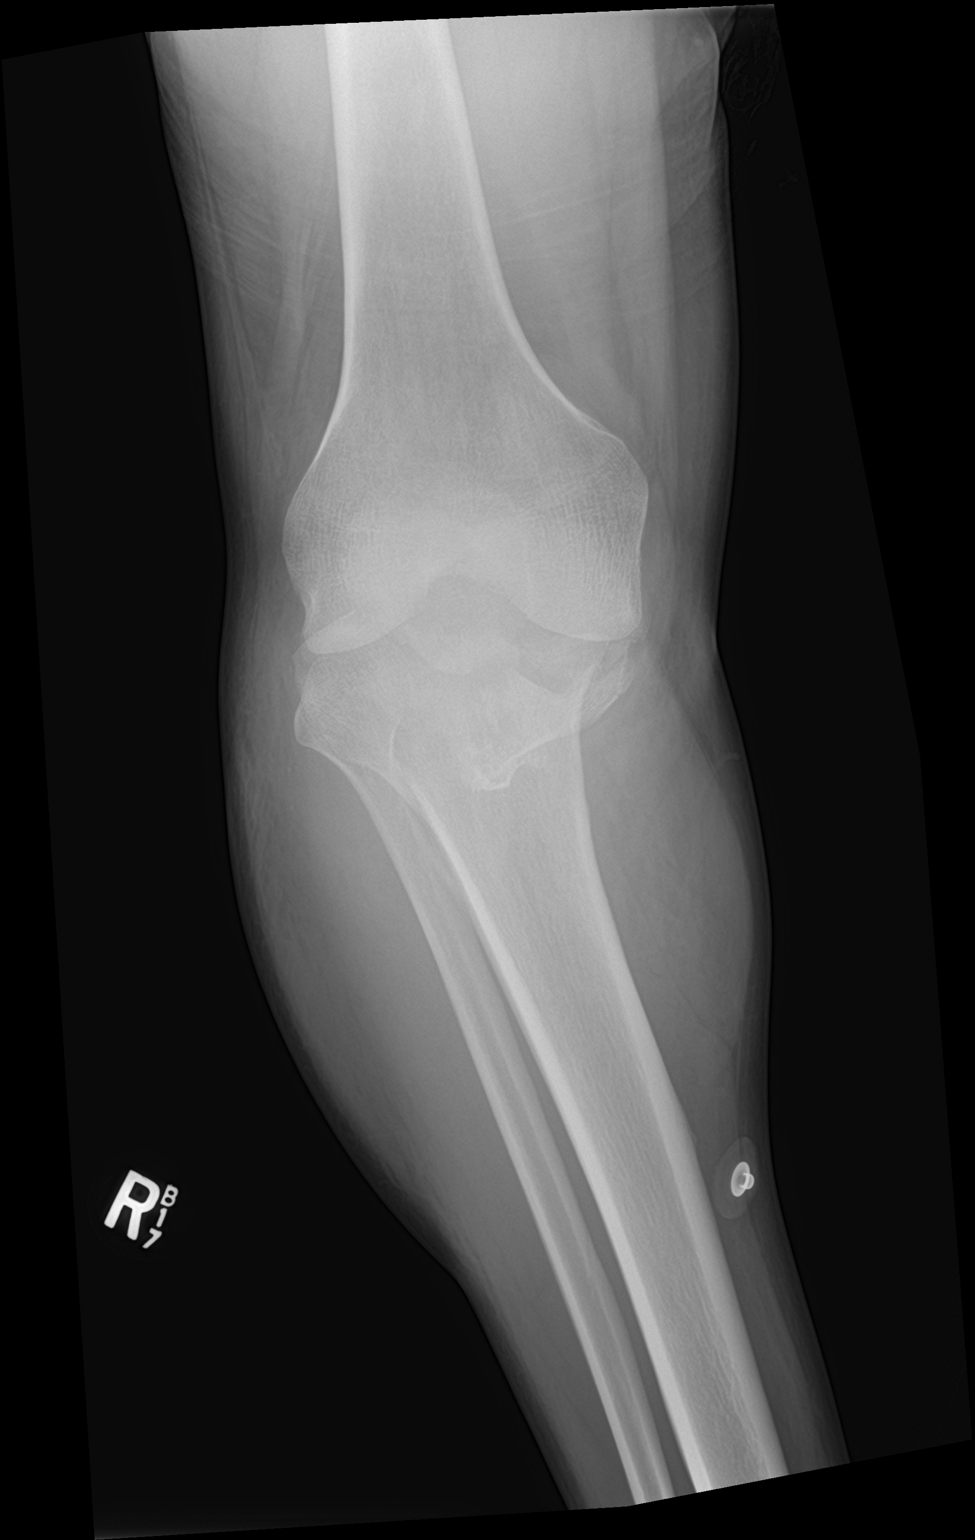

[knee lat]
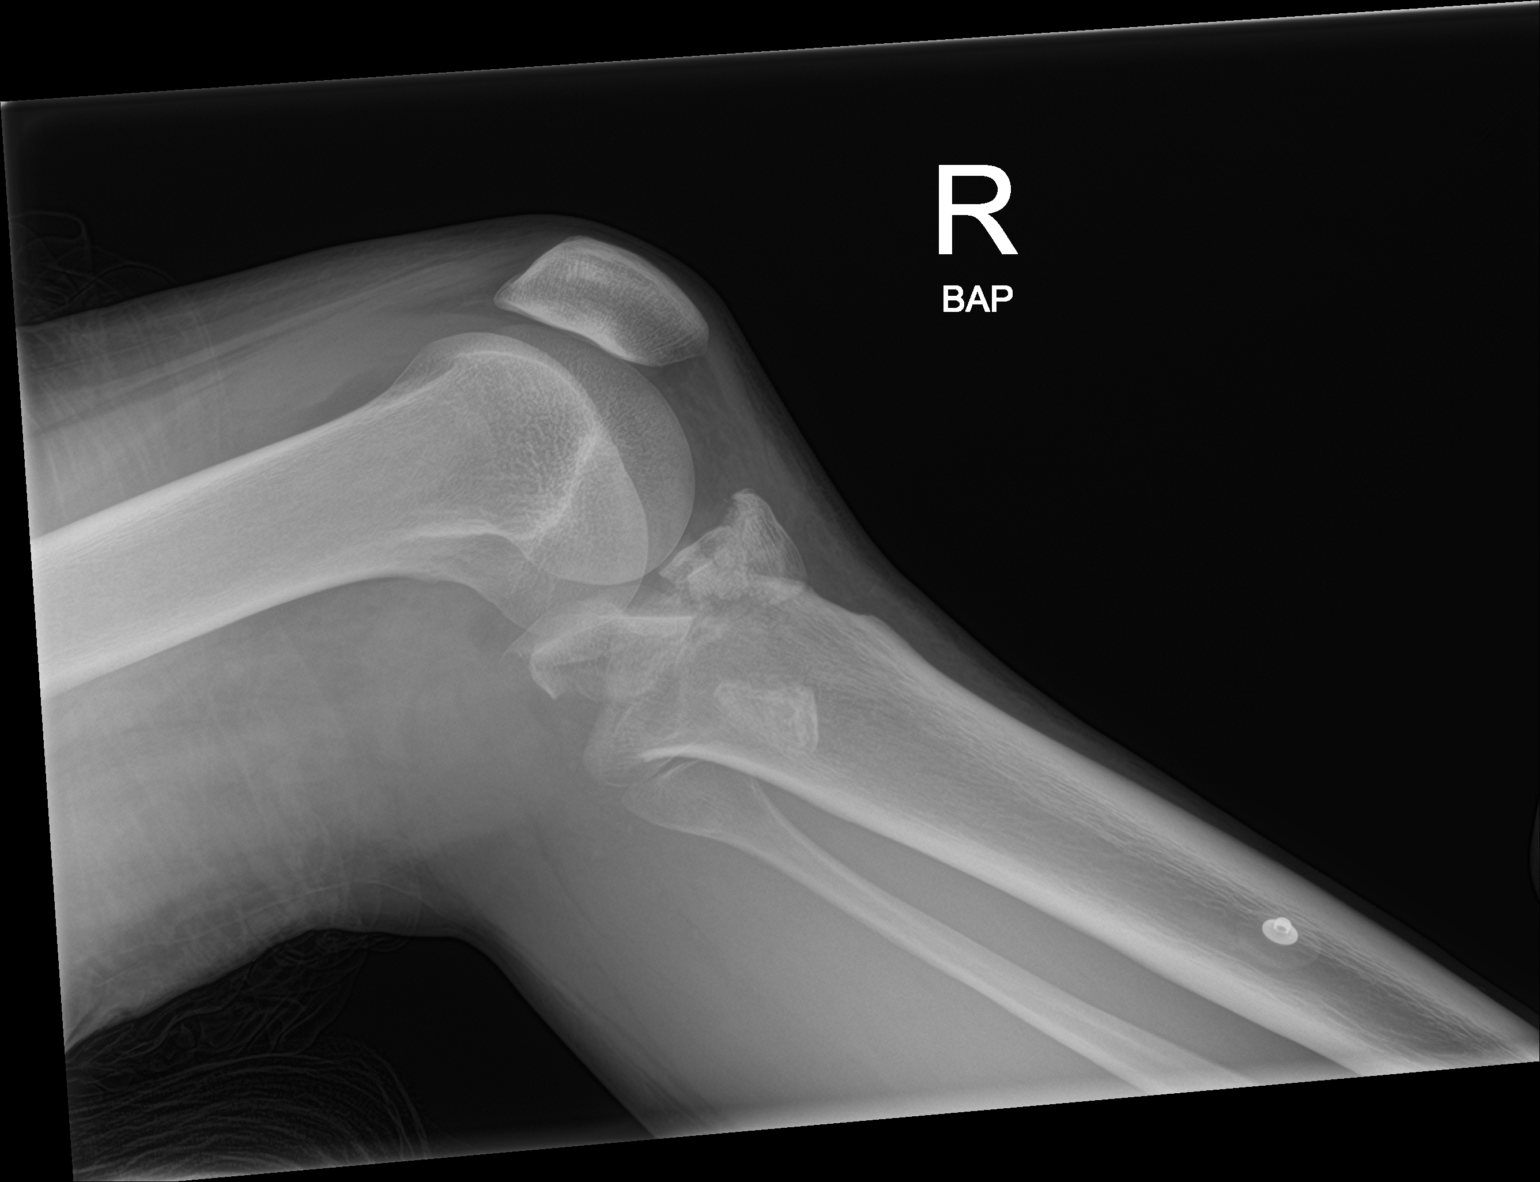

[knee obl]
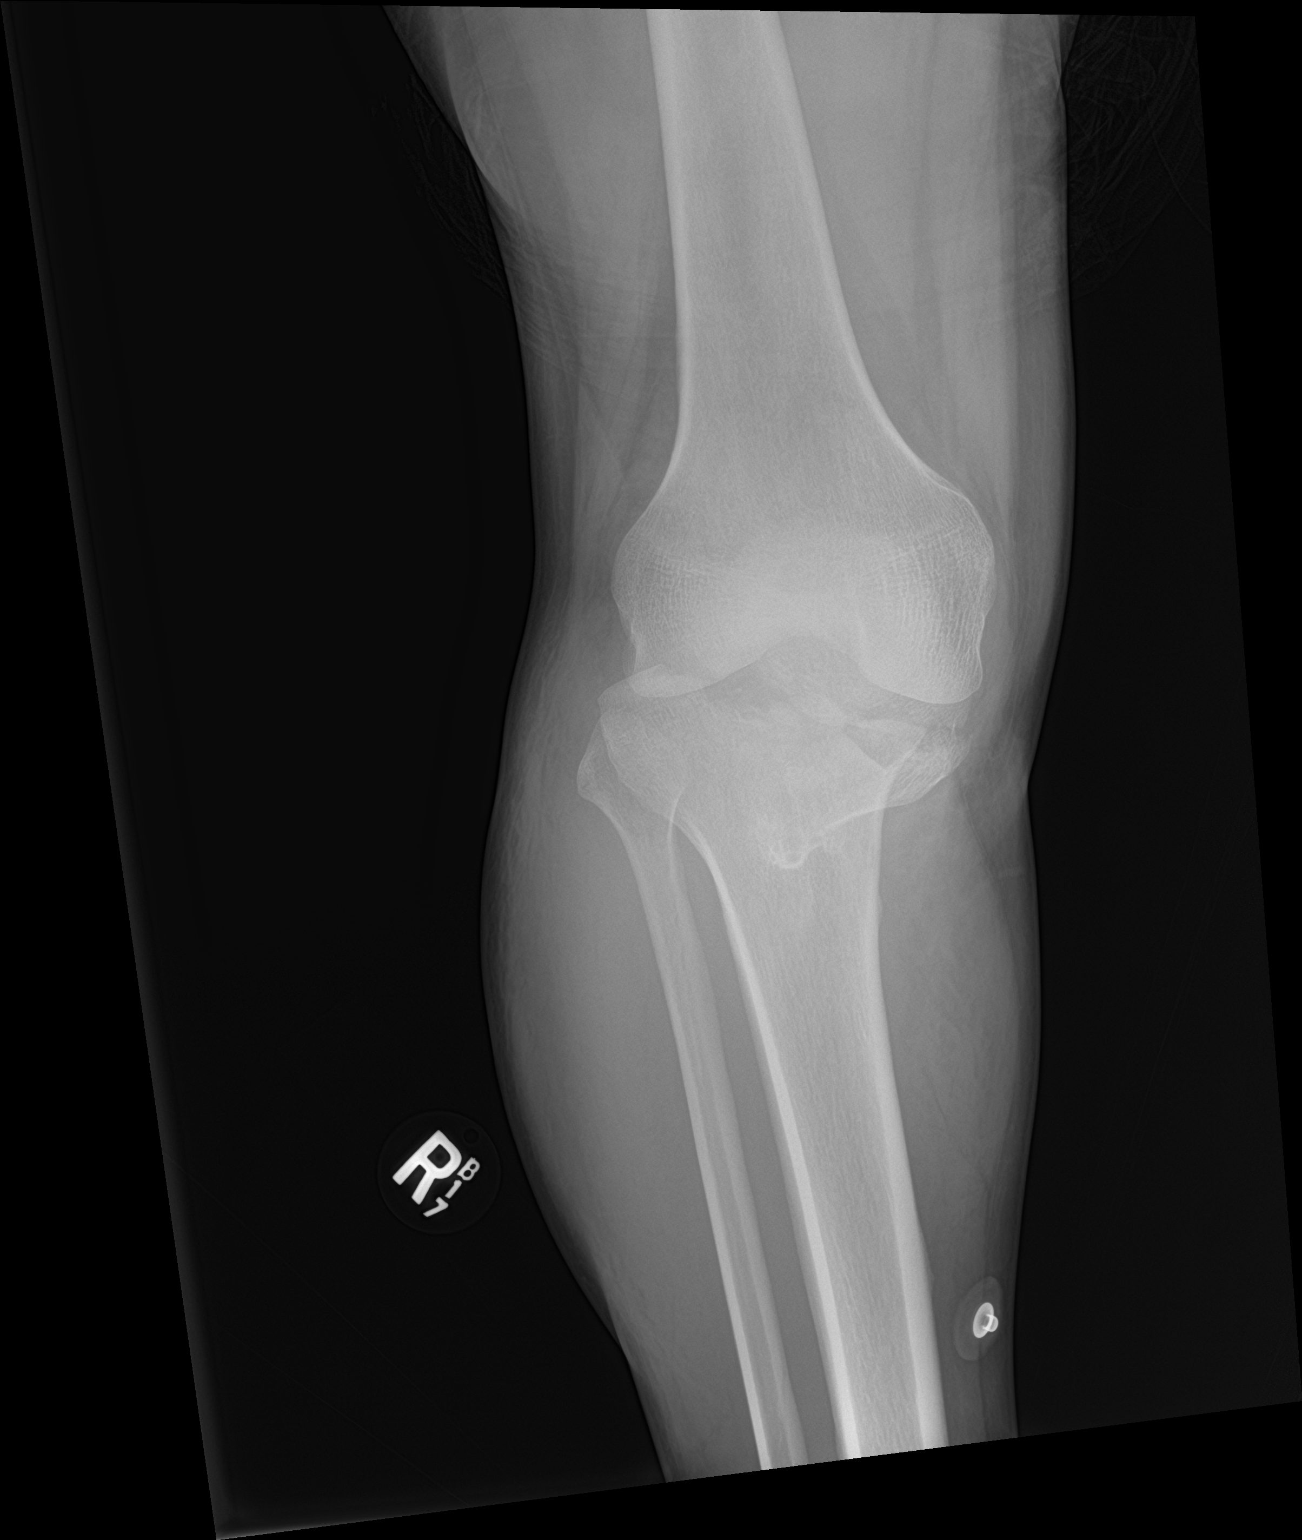

[3 of 3 positions shown; findings below may reference images not displayed]

FINDINGS: Comminuted fracture of the right tibial plateau is multiple fracture
fragments. Joint effusion.
IMPRESSION: Acute comminuted fracture of the tibial plateau with joint effusion

## 2020-06-03 ENCOUNTER — Emergency Department (HOSPITAL_COMMUNITY)
Admission: EM | Admit: 2020-06-03 | Discharge: 2020-06-03 | Discharge status: Against Medical Advice | Payer: Medicaid Other

## 2020-06-03 NOTE — ED Notes (Signed)
Called x2 for triage with no response

## 2020-06-13 ENCOUNTER — Inpatient Hospital Stay (HOSPITAL_COMMUNITY): Payer: Medicaid Other

## 2020-06-13 ENCOUNTER — Inpatient Hospital Stay (HOSPITAL_COMMUNITY): Payer: Medicaid Other | Admitting: Anesthesiology

## 2020-06-13 ENCOUNTER — Inpatient Hospital Stay (HOSPITAL_COMMUNITY): Admission: EM | Disposition: E | Payer: Self-pay | Source: Home / Self Care

## 2020-06-13 ENCOUNTER — Emergency Department (HOSPITAL_COMMUNITY): Payer: Medicaid Other

## 2020-06-13 ENCOUNTER — Inpatient Hospital Stay (HOSPITAL_COMMUNITY)
Admission: EM | Admit: 2020-06-13 | Discharge: 2020-06-20 | DRG: 025 | Disposition: E | Payer: Medicaid Other | Attending: Neurosurgery | Admitting: Neurosurgery

## 2020-06-13 DIAGNOSIS — N289 Disorder of kidney and ureter, unspecified: Secondary | ICD-10-CM

## 2020-06-13 DIAGNOSIS — W3400XA Accidental discharge from unspecified firearms or gun, initial encounter: Secondary | ICD-10-CM

## 2020-06-13 DIAGNOSIS — T1490XA Injury, unspecified, initial encounter: Secondary | ICD-10-CM

## 2020-06-13 DIAGNOSIS — Z978 Presence of other specified devices: Secondary | ICD-10-CM

## 2020-06-13 DIAGNOSIS — S0193XA Puncture wound without foreign body of unspecified part of head, initial encounter: Principal | ICD-10-CM

## 2020-06-13 DIAGNOSIS — U071 COVID-19: Secondary | ICD-10-CM

## 2020-06-13 DIAGNOSIS — S0592XA Unspecified injury of left eye and orbit, initial encounter: Secondary | ICD-10-CM

## 2020-06-13 DIAGNOSIS — Y249XXA Unspecified firearm discharge, undetermined intent, initial encounter: Secondary | ICD-10-CM

## 2020-06-13 DIAGNOSIS — D62 Acute posthemorrhagic anemia: Secondary | ICD-10-CM | POA: Diagnosis present

## 2020-06-13 DIAGNOSIS — S062X9A Diffuse traumatic brain injury with loss of consciousness of unspecified duration, initial encounter: Principal | ICD-10-CM | POA: Diagnosis present

## 2020-06-13 DIAGNOSIS — S0572XA Avulsion of left eye, initial encounter: Secondary | ICD-10-CM | POA: Diagnosis present

## 2020-06-13 DIAGNOSIS — R402352 Coma scale, best motor response, localizes pain, at arrival to emergency department: Secondary | ICD-10-CM | POA: Diagnosis present

## 2020-06-13 DIAGNOSIS — Z7189 Other specified counseling: Secondary | ICD-10-CM | POA: Diagnosis not present

## 2020-06-13 DIAGNOSIS — S0285XA Fracture of orbit, unspecified, initial encounter for closed fracture: Secondary | ICD-10-CM | POA: Diagnosis present

## 2020-06-13 DIAGNOSIS — R402232 Coma scale, best verbal response, inappropriate words, at arrival to emergency department: Secondary | ICD-10-CM | POA: Diagnosis present

## 2020-06-13 DIAGNOSIS — S0219XA Other fracture of base of skull, initial encounter for closed fracture: Secondary | ICD-10-CM | POA: Diagnosis present

## 2020-06-13 DIAGNOSIS — S0240DA Maxillary fracture, left side, initial encounter for closed fracture: Secondary | ICD-10-CM | POA: Diagnosis present

## 2020-06-13 DIAGNOSIS — R402112 Coma scale, eyes open, never, at arrival to emergency department: Secondary | ICD-10-CM | POA: Diagnosis present

## 2020-06-13 DIAGNOSIS — S066X9A Traumatic subarachnoid hemorrhage with loss of consciousness of unspecified duration, initial encounter: Principal | ICD-10-CM | POA: Diagnosis present

## 2020-06-13 DIAGNOSIS — J9601 Acute respiratory failure with hypoxia: Secondary | ICD-10-CM | POA: Diagnosis present

## 2020-06-13 DIAGNOSIS — Z66 Do not resuscitate: Secondary | ICD-10-CM | POA: Diagnosis not present

## 2020-06-13 DIAGNOSIS — R451 Restlessness and agitation: Secondary | ICD-10-CM | POA: Diagnosis present

## 2020-06-13 DIAGNOSIS — S06369A Traumatic hemorrhage of cerebrum, unspecified, with loss of consciousness of unspecified duration, initial encounter: Secondary | ICD-10-CM | POA: Diagnosis present

## 2020-06-13 DIAGNOSIS — Z515 Encounter for palliative care: Secondary | ICD-10-CM

## 2020-06-13 DIAGNOSIS — S020XXA Fracture of vault of skull, initial encounter for closed fracture: Secondary | ICD-10-CM | POA: Diagnosis present

## 2020-06-13 DIAGNOSIS — D689 Coagulation defect, unspecified: Secondary | ICD-10-CM | POA: Diagnosis present

## 2020-06-13 HISTORY — PX: CRANIOTOMY: SHX93

## 2020-06-13 LAB — POCT I-STAT 7, (LYTES, BLD GAS, ICA,H+H)
Acid-Base Excess: 0 mmol/L (ref 0.0–2.0)
Acid-base deficit: 13 mmol/L — ABNORMAL HIGH (ref 0.0–2.0)
Acid-base deficit: 2 mmol/L (ref 0.0–2.0)
Bicarbonate: 18.8 mmol/L — ABNORMAL LOW (ref 20.0–28.0)
Bicarbonate: 25.9 mmol/L (ref 20.0–28.0)
Bicarbonate: 27.9 mmol/L (ref 20.0–28.0)
Calcium, Ion: 0.56 mmol/L — CL (ref 1.15–1.40)
Calcium, Ion: 0.63 mmol/L — CL (ref 1.15–1.40)
Calcium, Ion: 1.27 mmol/L (ref 1.15–1.40)
HCT: 22 % — ABNORMAL LOW (ref 39.0–52.0)
HCT: 29 % — ABNORMAL LOW (ref 39.0–52.0)
HCT: 38 % — ABNORMAL LOW (ref 39.0–52.0)
Hemoglobin: 7.5 g/dL — ABNORMAL LOW (ref 13.0–17.0)
Hemoglobin: 9.9 g/dL — ABNORMAL LOW (ref 13.0–17.0)
Hemoglobin: 12.9 g/dL — ABNORMAL LOW (ref 13.0–17.0)
O2 Saturation: 100 %
O2 Saturation: 100 %
O2 Saturation: 87 %
Patient temperature: 31.5
Patient temperature: 32.3
Patient temperature: 33.2
Potassium: 5.9 mmol/L — ABNORMAL HIGH (ref 3.5–5.1)
Potassium: 6.1 mmol/L — ABNORMAL HIGH (ref 3.5–5.1)
Potassium: 4.4 mmol/L (ref 3.5–5.1)
Sodium: 141 mmol/L (ref 135–145)
Sodium: 144 mmol/L (ref 135–145)
Sodium: 147 mmol/L — ABNORMAL HIGH (ref 135–145)
TCO2: 21 mmol/L — ABNORMAL LOW (ref 22–32)
TCO2: 28 mmol/L (ref 22–32)
TCO2: 30 mmol/L (ref 22–32)
pCO2 arterial: 53.7 mmHg — ABNORMAL HIGH (ref 32.0–48.0)
pCO2 arterial: 62.6 mmHg — ABNORMAL HIGH (ref 32.0–48.0)
pCO2 arterial: 49.7 mmHg — ABNORMAL HIGH (ref 32.0–48.0)
pH, Arterial: 7.047 — CL (ref 7.350–7.450)
pH, Arterial: 7.266 — ABNORMAL LOW (ref 7.350–7.450)
pH, Arterial: 7.339 — ABNORMAL LOW (ref 7.350–7.450)
pO2, Arterial: 459 mmHg — ABNORMAL HIGH (ref 83.0–108.0)
pO2, Arterial: 538 mmHg — ABNORMAL HIGH (ref 83.0–108.0)
pO2, Arterial: 47 mmHg — ABNORMAL LOW (ref 83.0–108.0)

## 2020-06-13 LAB — I-STAT ARTERIAL BLOOD GAS, ED
Acid-base deficit: 16 mmol/L — ABNORMAL HIGH (ref 0.0–2.0)
Bicarbonate: 14.2 mmol/L — ABNORMAL LOW (ref 20.0–28.0)
Calcium, Ion: 1.1 mmol/L — ABNORMAL LOW (ref 1.15–1.40)
HCT: 23 % — ABNORMAL LOW (ref 39.0–52.0)
Hemoglobin: 7.8 g/dL — ABNORMAL LOW (ref 13.0–17.0)
O2 Saturation: 100 %
Patient temperature: 97
Potassium: 4.7 mmol/L (ref 3.5–5.1)
Sodium: 136 mmol/L (ref 135–145)
TCO2: 16 mmol/L — ABNORMAL LOW (ref 22–32)
pCO2 arterial: 52.9 mmHg — ABNORMAL HIGH (ref 32.0–48.0)
pH, Arterial: 7.032 — CL (ref 7.350–7.450)
pO2, Arterial: 446 mmHg — ABNORMAL HIGH (ref 83.0–108.0)

## 2020-06-13 LAB — URINALYSIS, ROUTINE W REFLEX MICROSCOPIC
Bilirubin Urine: NEGATIVE
Glucose, UA: NEGATIVE mg/dL
Ketones, ur: NEGATIVE mg/dL
Leukocytes,Ua: NEGATIVE
Nitrite: NEGATIVE
Protein, ur: NEGATIVE mg/dL
Specific Gravity, Urine: 1.006 (ref 1.005–1.030)
pH: 5 (ref 5.0–8.0)

## 2020-06-13 LAB — COMPREHENSIVE METABOLIC PANEL
ALT: 17 U/L (ref 0–44)
ALT: 34 U/L (ref 0–44)
AST: 29 U/L (ref 15–41)
AST: 44 U/L — ABNORMAL HIGH (ref 15–41)
Albumin: 3.2 g/dL — ABNORMAL LOW (ref 3.5–5.0)
Albumin: 2.6 g/dL — ABNORMAL LOW (ref 3.5–5.0)
Alkaline Phosphatase: 85 U/L (ref 38–126)
Alkaline Phosphatase: 45 U/L (ref 38–126)
Anion gap: 20 — ABNORMAL HIGH (ref 5–15)
Anion gap: 8 (ref 5–15)
BUN: 9 mg/dL (ref 6–20)
BUN: 12 mg/dL (ref 6–20)
CO2: 15 mmol/L — ABNORMAL LOW (ref 22–32)
CO2: 25 mmol/L (ref 22–32)
Calcium: 9.3 mg/dL (ref 8.9–10.3)
Calcium: 7.9 mg/dL — ABNORMAL LOW (ref 8.9–10.3)
Chloride: 109 mmol/L (ref 98–111)
Chloride: 116 mmol/L — ABNORMAL HIGH (ref 98–111)
Creatinine, Ser: 1.81 mg/dL — ABNORMAL HIGH (ref 0.61–1.24)
Creatinine, Ser: 1.48 mg/dL — ABNORMAL HIGH (ref 0.61–1.24)
GFR, Estimated: 25 mL/min — ABNORMAL LOW (ref >60)
GFR, Estimated: 59 mL/min — ABNORMAL LOW (ref >60)
Glucose, Bld: 182 mg/dL — ABNORMAL HIGH (ref 70–99)
Glucose, Bld: 117 mg/dL — ABNORMAL HIGH (ref 70–99)
Potassium: 4.9 mmol/L (ref 3.5–5.1)
Potassium: 3.8 mmol/L (ref 3.5–5.1)
Sodium: 144 mmol/L (ref 135–145)
Sodium: 149 mmol/L — ABNORMAL HIGH (ref 135–145)
Total Bilirubin: 1 mg/dL (ref 0.3–1.2)
Total Bilirubin: 1.9 mg/dL — ABNORMAL HIGH (ref 0.3–1.2)
Total Protein: 5.7 g/dL — ABNORMAL LOW (ref 6.5–8.1)
Total Protein: 4.6 g/dL — ABNORMAL LOW (ref 6.5–8.1)

## 2020-06-13 LAB — I-STAT CHEM 8, ED
BUN: 11 mg/dL (ref 6–20)
Calcium, Ion: 1.17 mmol/L (ref 1.15–1.40)
Chloride: 116 mmol/L — ABNORMAL HIGH (ref 98–111)
Creatinine, Ser: 1.6 mg/dL — ABNORMAL HIGH (ref 0.61–1.24)
Glucose, Bld: 162 mg/dL — ABNORMAL HIGH (ref 70–99)
HCT: 40 % (ref 39.0–52.0)
Hemoglobin: 13.6 g/dL (ref 13.0–17.0)
Potassium: 4.9 mmol/L (ref 3.5–5.1)
Sodium: 146 mmol/L — ABNORMAL HIGH (ref 135–145)
TCO2: 16 mmol/L — ABNORMAL LOW (ref 22–32)

## 2020-06-13 LAB — RAPID HIV SCREEN (HIV 1/2 AB+AG)
HIV 1/2 Antibodies: NONREACTIVE
HIV-1 P24 Antigen - HIV24: NONREACTIVE

## 2020-06-13 LAB — CBC
HCT: 43.5 % (ref 39.0–52.0)
HCT: 38.7 % — ABNORMAL LOW (ref 39.0–52.0)
Hemoglobin: 13.4 g/dL (ref 13.0–17.0)
Hemoglobin: 13.7 g/dL (ref 13.0–17.0)
MCH: 30.7 pg (ref 26.0–34.0)
MCH: 30.6 pg (ref 26.0–34.0)
MCHC: 30.8 g/dL (ref 30.0–36.0)
MCHC: 35.4 g/dL (ref 30.0–36.0)
MCV: 99.5 fL (ref 80.0–100.0)
MCV: 86.6 fL (ref 80.0–100.0)
Platelets: 260 10*3/uL (ref 150–400)
Platelets: 131 10*3/uL — ABNORMAL LOW (ref 150–400)
RBC: 4.37 MIL/uL (ref 4.22–5.81)
RBC: 4.47 MIL/uL (ref 4.22–5.81)
RDW: 13.6 % (ref 11.5–15.5)
RDW: 13.7 % (ref 11.5–15.5)
WBC: 16.6 10*3/uL — ABNORMAL HIGH (ref 4.0–10.5)
WBC: 9.1 10*3/uL (ref 4.0–10.5)
nRBC: 0 % (ref 0.0–0.2)
nRBC: 0 % (ref 0.0–0.2)

## 2020-06-13 LAB — ABO/RH: ABO/RH(D): A POS

## 2020-06-13 LAB — MAGNESIUM: Magnesium: 1.9 mg/dL (ref 1.7–2.4)

## 2020-06-13 LAB — PROTIME-INR
INR: 1.5 — ABNORMAL HIGH (ref 0.8–1.2)
Prothrombin Time: 17.1 seconds — ABNORMAL HIGH (ref 11.4–15.2)

## 2020-06-13 LAB — ETHANOL: Alcohol, Ethyl (B): <10 mg/dL (ref <10)

## 2020-06-13 LAB — PHOSPHORUS: Phosphorus: 5.3 mg/dL — ABNORMAL HIGH (ref 2.5–4.6)

## 2020-06-13 LAB — LACTIC ACID, PLASMA
Lactic Acid, Venous: >11 mmol/L (ref 0.5–1.9)
Lactic Acid, Venous: 1.4 mmol/L (ref 0.5–1.9)

## 2020-06-13 LAB — OSMOLALITY, URINE: Osmolality, Ur: 192 mOsm/kg — ABNORMAL LOW (ref 300–900)

## 2020-06-13 LAB — HEPATITIS PANEL, ACUTE
HCV Ab: NONREACTIVE
Hep A IgM: NONREACTIVE
Hep B C IgM: NONREACTIVE
Hepatitis B Surface Ag: NONREACTIVE

## 2020-06-13 LAB — TRAUMA TEG PANEL
CFF Max Amplitude: 14.5 mm — ABNORMAL LOW (ref 15–32)
Citrated Kaolin (R): 5.1 min (ref 4.6–9.1)
Citrated Rapid TEG (MA): 47.6 mm — ABNORMAL LOW (ref 52–70)
Lysis at 30 Minutes: 0 % (ref 0.0–2.6)

## 2020-06-13 LAB — BLOOD PRODUCT ORDER (VERBAL) VERIFICATION

## 2020-06-13 LAB — SAMPLE TO BLOOD BANK

## 2020-06-13 LAB — SARS CORONAVIRUS 2 BY RT PCR (HOSPITAL ORDER, PERFORMED IN ~~LOC~~ HOSPITAL LAB): SARS Coronavirus 2: POSITIVE — AB

## 2020-06-13 SURGERY — CRANIOTOMY HEMATOMA EVACUATION SUBDURAL
Anesthesia: General | Site: Head

## 2020-06-13 MED ORDER — BACITRACIN ZINC 500 UNIT/GM EX OINT
TOPICAL_OINTMENT | CUTANEOUS | Status: AC
  Filled 2020-06-13: qty 28.35

## 2020-06-13 MED ORDER — ROCURONIUM BROMIDE 100 MG/10ML IV SOLN
INTRAVENOUS | Status: DC | PRN
  Administered 2020-06-13 (×3): 100 mg via INTRAVENOUS

## 2020-06-13 MED ORDER — DOCUSATE SODIUM 50 MG/5ML PO LIQD
100.0000 mg | Freq: Two times a day (BID) | ORAL | Status: DC
  Administered 2020-06-15 – 2020-06-19 (×8): 100 mg
  Filled 2020-06-13 (×7): qty 10

## 2020-06-13 MED ORDER — POLYETHYLENE GLYCOL 3350 17 G PO PACK: 17.0000 g | PACK | Freq: Every day | ORAL | Status: DC

## 2020-06-13 MED ORDER — PANTOPRAZOLE SODIUM 40 MG IV SOLR
40.0000 mg | Freq: Every day | INTRAVENOUS | Status: DC
  Administered 2020-06-13 – 2020-06-14 (×2): 40 mg via INTRAVENOUS
  Filled 2020-06-13 (×2): qty 40

## 2020-06-13 MED ORDER — METOPROLOL TARTRATE 5 MG/5ML IV SOLN
5.0000 mg | Freq: Four times a day (QID) | INTRAVENOUS | Status: DC | PRN
  Administered 2020-06-14 – 2020-06-16 (×3): 5 mg via INTRAVENOUS
  Filled 2020-06-13 (×3): qty 5

## 2020-06-13 MED ORDER — ETOMIDATE 2 MG/ML IV SOLN
INTRAVENOUS | Status: AC | PRN
  Administered 2020-06-13: 20 mg via INTRAVENOUS

## 2020-06-13 MED ORDER — THROMBIN 20000 UNITS EX SOLR
CUTANEOUS | Status: AC
  Filled 2020-06-13: qty 20000

## 2020-06-13 MED ORDER — MORPHINE SULFATE (PF) 2 MG/ML IV SOLN
2.0000 mg | INTRAVENOUS | Status: DC | PRN
  Administered 2020-06-18 – 2020-06-19 (×3): 4 mg via INTRAVENOUS
  Filled 2020-06-13 (×3): qty 2

## 2020-06-13 MED ORDER — FENTANYL CITRATE (PF) 100 MCG/2ML IJ SOLN
50.0000 ug | Freq: Once | INTRAMUSCULAR | Status: DC
Start: 2020-06-13 — End: 2020-06-14

## 2020-06-13 MED ORDER — MICROFIBRILLAR COLL HEMOSTAT EX POWD
CUTANEOUS | Status: AC
  Filled 2020-06-13: qty 5

## 2020-06-13 MED ORDER — FENTANYL CITRATE (PF) 250 MCG/5ML IJ SOLN
INTRAMUSCULAR | Status: DC | PRN
  Administered 2020-06-13: 100 ug via INTRAVENOUS
  Administered 2020-06-13: 50 ug via INTRAVENOUS
  Administered 2020-06-13: 100 ug via INTRAVENOUS

## 2020-06-13 MED ORDER — CEFAZOLIN SODIUM-DEXTROSE 2-4 GM/100ML-% IV SOLN
2.0000 g | Freq: Three times a day (TID) | INTRAVENOUS | Status: AC
  Administered 2020-06-13 (×2): 2 g via INTRAVENOUS
  Filled 2020-06-13 (×3): qty 100

## 2020-06-13 MED ORDER — LABETALOL HCL 5 MG/ML IV SOLN
10.0000 mg | INTRAVENOUS | Status: DC | PRN
  Administered 2020-06-14 – 2020-06-16 (×3): 20 mg via INTRAVENOUS
  Filled 2020-06-13 (×5): qty 4

## 2020-06-13 MED ORDER — FENTANYL CITRATE (PF) 250 MCG/5ML IJ SOLN
INTRAMUSCULAR | Status: AC
  Filled 2020-06-13: qty 5

## 2020-06-13 MED ORDER — ONDANSETRON 4 MG PO TBDP: 4.0000 mg | ORAL_TABLET | Freq: Four times a day (QID) | ORAL | Status: DC | PRN

## 2020-06-13 MED ORDER — TETANUS-DIPHTH-ACELL PERTUSSIS 5-2.5-18.5 LF-MCG/0.5 IM SUSY
0.5000 mL | PREFILLED_SYRINGE | Freq: Once | INTRAMUSCULAR | Status: DC
  Filled 2020-06-13: qty 0.5

## 2020-06-13 MED ORDER — 0.9 % SODIUM CHLORIDE (POUR BTL) OPTIME
TOPICAL | Status: DC | PRN
  Administered 2020-06-13 (×2): 1000 mL

## 2020-06-13 MED ORDER — SUCCINYLCHOLINE CHLORIDE 20 MG/ML IJ SOLN
INTRAMUSCULAR | Status: AC | PRN
  Administered 2020-06-13: 100 mg via INTRAVENOUS

## 2020-06-13 MED ORDER — PROPOFOL 1000 MG/100ML IV EMUL
5.0000 ug/kg/min | INTRAVENOUS | Status: DC
  Administered 2020-06-13: 30 ug/kg/min via INTRAVENOUS
  Administered 2020-06-13: 20 ug/kg/min via INTRAVENOUS
  Administered 2020-06-13: 10 ug/kg/min via INTRAVENOUS
  Administered 2020-06-13: 50 ug/kg/min via INTRAVENOUS
  Administered 2020-06-14 (×2): 20 ug/kg/min via INTRAVENOUS
  Administered 2020-06-15 (×3): 30 ug/kg/min via INTRAVENOUS
  Administered 2020-06-16 (×3): 40 ug/kg/min via INTRAVENOUS
  Administered 2020-06-17: 30 ug/kg/min via INTRAVENOUS
  Administered 2020-06-17 – 2020-06-18 (×4): 40 ug/kg/min via INTRAVENOUS
  Administered 2020-06-18: 30 ug/kg/min via INTRAVENOUS
  Administered 2020-06-19 (×2): 40 ug/kg/min via INTRAVENOUS
  Filled 2020-06-13: qty 100
  Filled 2020-06-13: qty 200
  Filled 2020-06-13 (×2): qty 100
  Filled 2020-06-13: qty 200
  Filled 2020-06-13 (×8): qty 100
  Filled 2020-06-13: qty 200
  Filled 2020-06-13 (×7): qty 100
  Filled 2020-06-13: qty 200

## 2020-06-13 MED ORDER — PROPOFOL 1000 MG/100ML IV EMUL
INTRAVENOUS | Status: AC
  Filled 2020-06-13: qty 100

## 2020-06-13 MED ORDER — SUCCINYLCHOLINE CHLORIDE 200 MG/10ML IV SOSY
PREFILLED_SYRINGE | INTRAVENOUS | Status: AC
  Filled 2020-06-13: qty 10

## 2020-06-13 MED ORDER — CEFAZOLIN SODIUM-DEXTROSE 2-4 GM/100ML-% IV SOLN
2.0000 g | Freq: Once | INTRAVENOUS | Status: AC
  Administered 2020-06-13: 2 g via INTRAVENOUS
  Filled 2020-06-13: qty 100

## 2020-06-13 MED ORDER — FENTANYL 2500MCG IN NS 250ML (10MCG/ML) PREMIX INFUSION
50.0000 ug/h | INTRAVENOUS | Status: DC
  Administered 2020-06-13: 50 ug/h via INTRAVENOUS
  Administered 2020-06-14 – 2020-06-17 (×7): 150 ug/h via INTRAVENOUS
  Administered 2020-06-19 (×2): 200 ug/h via INTRAVENOUS
  Filled 2020-06-13 (×11): qty 250

## 2020-06-13 MED ORDER — SODIUM CHLORIDE 0.9 % IV SOLN: INTRAVENOUS | Status: DC | PRN

## 2020-06-13 MED ORDER — THROMBIN 5000 UNITS EX SOLR
OROMUCOSAL | Status: DC | PRN
  Administered 2020-06-13 (×2): 5 mL via TOPICAL

## 2020-06-13 MED ORDER — LIDOCAINE-EPINEPHRINE 1 %-1:100000 IJ SOLN
INTRAMUSCULAR | Status: DC | PRN
  Administered 2020-06-13: 10 mL via INTRADERMAL

## 2020-06-13 MED ORDER — PANTOPRAZOLE SODIUM 40 MG IV SOLR: 40.0000 mg | Freq: Every day | INTRAVENOUS | Status: DC

## 2020-06-13 MED ORDER — SODIUM BICARBONATE 8.4 % IV SOLN
INTRAVENOUS | Status: DC | PRN
  Administered 2020-06-13: 100 meq via INTRAVENOUS
  Administered 2020-06-13: 50 meq via INTRAVENOUS

## 2020-06-13 MED ORDER — CEFAZOLIN SODIUM-DEXTROSE 2-4 GM/100ML-% IV SOLN
2.0000 g | Freq: Once | INTRAVENOUS | Status: DC
  Filled 2020-06-13: qty 100

## 2020-06-13 MED ORDER — DOCUSATE SODIUM 100 MG PO CAPS: 100.0000 mg | ORAL_CAPSULE | Freq: Two times a day (BID) | ORAL | Status: DC

## 2020-06-13 MED ORDER — CHLORHEXIDINE GLUCONATE CLOTH 2 % EX PADS
6.0000 | MEDICATED_PAD | Freq: Every day | CUTANEOUS | Status: DC
  Administered 2020-06-13 – 2020-06-19 (×6): 6 via TOPICAL

## 2020-06-13 MED ORDER — ALBUTEROL SULFATE (2.5 MG/3ML) 0.083% IN NEBU
INHALATION_SOLUTION | RESPIRATORY_TRACT | Status: AC
  Administered 2020-06-13: 2.5 mg
  Filled 2020-06-13: qty 3

## 2020-06-13 MED ORDER — ONDANSETRON HCL 4 MG/2ML IJ SOLN: 4.0000 mg | INTRAMUSCULAR | Status: DC | PRN

## 2020-06-13 MED ORDER — ONDANSETRON HCL 4 MG/2ML IJ SOLN: 4.0000 mg | Freq: Four times a day (QID) | INTRAMUSCULAR | Status: DC | PRN

## 2020-06-13 MED ORDER — ACETAMINOPHEN 650 MG RE SUPP
650.0000 mg | RECTAL | Status: DC | PRN
  Administered 2020-06-13: 650 mg via RECTAL
  Filled 2020-06-13: qty 1

## 2020-06-13 MED ORDER — POTASSIUM CHLORIDE IN NACL 20-0.9 MEQ/L-% IV SOLN
INTRAVENOUS | Status: DC
  Filled 2020-06-13 (×5): qty 1000

## 2020-06-13 MED ORDER — PROPOFOL 1000 MG/100ML IV EMUL: 0.0000 ug/kg/min | INTRAVENOUS | Status: DC

## 2020-06-13 MED ORDER — THROMBIN 5000 UNITS EX SOLR
CUTANEOUS | Status: AC
  Filled 2020-06-13: qty 5000

## 2020-06-13 MED ORDER — ONDANSETRON HCL 4 MG PO TABS: 4.0000 mg | ORAL_TABLET | ORAL | Status: DC | PRN

## 2020-06-13 MED ORDER — THROMBIN 20000 UNITS EX SOLR
CUTANEOUS | Status: DC | PRN
  Administered 2020-06-13 (×2): 20 mL via TOPICAL

## 2020-06-13 MED ORDER — IOHEXOL 300 MG/ML  SOLN
100.0000 mL | Freq: Once | INTRAMUSCULAR | Status: AC | PRN
  Administered 2020-06-13: 100 mL via INTRAVENOUS

## 2020-06-13 MED ORDER — DOCUSATE SODIUM 50 MG/5ML PO LIQD: 100.0000 mg | Freq: Two times a day (BID) | ORAL | Status: DC

## 2020-06-13 MED ORDER — SODIUM CHLORIDE 0.9 % IV SOLN
INTRAVENOUS | Status: AC | PRN
Start: 2020-06-13 — End: 2020-06-13
  Administered 2020-06-13 (×2): 1000 mL via INTRAVENOUS

## 2020-06-13 MED ORDER — PANTOPRAZOLE SODIUM 40 MG PO TBEC: 40.0000 mg | DELAYED_RELEASE_TABLET | Freq: Every day | ORAL | Status: DC

## 2020-06-13 MED ORDER — MICROFIBRILLAR COLL HEMOSTAT EX PADS
MEDICATED_PAD | CUTANEOUS | Status: DC | PRN
  Administered 2020-06-13: 1 via TOPICAL

## 2020-06-13 MED ORDER — MICROFIBRILLAR COLL HEMOSTAT EX POWD
CUTANEOUS | Status: DC | PRN
  Administered 2020-06-13 (×2): 5 g via TOPICAL

## 2020-06-13 MED ORDER — PROMETHAZINE HCL 25 MG PO TABS: 12.5000 mg | ORAL_TABLET | ORAL | Status: DC | PRN

## 2020-06-13 MED ORDER — IOHEXOL 300 MG/ML  SOLN: 100.0000 mL | Freq: Once | INTRAMUSCULAR | Status: DC | PRN

## 2020-06-13 MED ORDER — LEVETIRACETAM IN NACL 500 MG/100ML IV SOLN
500.0000 mg | Freq: Two times a day (BID) | INTRAVENOUS | Status: DC
  Administered 2020-06-13 – 2020-06-14 (×3): 500 mg via INTRAVENOUS
  Filled 2020-06-13 (×3): qty 100

## 2020-06-13 MED ORDER — ACETAMINOPHEN 325 MG PO TABS: 650.0000 mg | ORAL_TABLET | ORAL | Status: DC | PRN

## 2020-06-13 MED ORDER — ORAL CARE MOUTH RINSE
15.0000 mL | OROMUCOSAL | Status: DC
  Administered 2020-06-13 – 2020-06-19 (×63): 15 mL via OROMUCOSAL

## 2020-06-13 MED ORDER — THROMBIN 5000 UNITS EX SOLR
CUTANEOUS | Status: AC
  Filled 2020-06-13: qty 10000

## 2020-06-13 MED ORDER — CHLORHEXIDINE GLUCONATE 0.12% ORAL RINSE (MEDLINE KIT)
15.0000 mL | Freq: Two times a day (BID) | OROMUCOSAL | Status: DC
  Administered 2020-06-13 – 2020-06-19 (×13): 15 mL via OROMUCOSAL

## 2020-06-13 MED ORDER — POLYETHYLENE GLYCOL 3350 17 G PO PACK
17.0000 g | PACK | Freq: Every day | ORAL | Status: DC
  Administered 2020-06-15 – 2020-06-19 (×5): 17 g
  Filled 2020-06-13 (×4): qty 1

## 2020-06-13 MED ORDER — CALCIUM CHLORIDE 10 % IV SOLN
INTRAVENOUS | Status: DC | PRN
  Administered 2020-06-13 (×2): 1000 mg via INTRAVENOUS

## 2020-06-13 MED ORDER — FENTANYL BOLUS VIA INFUSION
50.0000 ug | INTRAVENOUS | Status: DC | PRN
  Filled 2020-06-13: qty 50

## 2020-06-13 SURGICAL SUPPLY — 62 items
BAND RUBBER #18 3X1/16 STRL (MISCELLANEOUS) ×4 IMPLANT
BATTERY IQ STERILE (MISCELLANEOUS) IMPLANT
BIT DRILL WIRE PASS 1.3MM (BIT) IMPLANT
BLADE CLIPPER SPEC (BLADE) ×2 IMPLANT
BUR ACORN 9.0 PRECISION (BURR) ×2 IMPLANT
BUR PRECISION FLUTE 6.0 (BURR) ×2 IMPLANT
BUR SPIRAL ROUTER 2.3 (BUR) ×2 IMPLANT
CANISTER SUCT 3000ML PPV (MISCELLANEOUS) ×2 IMPLANT
CARTRIDGE OIL MAESTRO DRILL (MISCELLANEOUS) ×1 IMPLANT
CLIP VESOCCLUDE MED 6/CT (CLIP) IMPLANT
COVER BACK TABLE 60X90IN (DRAPES) IMPLANT
COVER WAND RF STERILE (DRAPES) ×2 IMPLANT
DIFFUSER DRILL AIR PNEUMATIC (MISCELLANEOUS) ×2 IMPLANT
DRAIN JACKSON PRATT 10MM FLAT (MISCELLANEOUS) ×2 IMPLANT
DRAPE NEUROLOGICAL W/INCISE (DRAPES) ×2 IMPLANT
DRAPE SURG 17X23 STRL (DRAPES) IMPLANT
DRAPE WARM FLUID 44X44 (DRAPES) ×2 IMPLANT
DRILL WIRE PASS 1.3MM (BIT)
ELECT REM PT RETURN 9FT ADLT (ELECTROSURGICAL) ×2
ELECTRODE REM PT RTRN 9FT ADLT (ELECTROSURGICAL) ×1 IMPLANT
EVACUATOR 1/8 PVC DRAIN (DRAIN) IMPLANT
EVACUATOR SILICONE 100CC (DRAIN) IMPLANT
FORCEPS BIPOLAR SPETZLER 8 1.0 (NEUROSURGERY SUPPLIES) ×2 IMPLANT
GAUZE 4X4 16PLY RFD (DISPOSABLE) IMPLANT
GAUZE SPONGE 4X4 12PLY STRL (GAUZE/BANDAGES/DRESSINGS) ×4 IMPLANT
GLOVE BIO SURGEON STRL SZ7 (GLOVE) ×2 IMPLANT
GLOVE BIO SURGEON STRL SZ8 (GLOVE) ×2 IMPLANT
GLOVE BIO SURGEON STRL SZ8.5 (GLOVE) ×2 IMPLANT
GLOVE BIOGEL M 6.5 STRL (GLOVE) ×4 IMPLANT
GLOVE ECLIPSE 6.5 STRL STRAW (GLOVE) ×2 IMPLANT
GLOVE EXAM NITRILE XL STR (GLOVE) IMPLANT
GOWN STRL REUS W/ TWL LRG LVL3 (GOWN DISPOSABLE) ×4 IMPLANT
GOWN STRL REUS W/ TWL XL LVL3 (GOWN DISPOSABLE) ×4 IMPLANT
GOWN STRL REUS W/TWL LRG LVL3 (GOWN DISPOSABLE) ×8
GOWN STRL REUS W/TWL XL LVL3 (GOWN DISPOSABLE) ×8
KIT BASIN OR (CUSTOM PROCEDURE TRAY) ×2 IMPLANT
KIT TURNOVER KIT B (KITS) ×2 IMPLANT
MARKER SKIN DUAL TIP RULER LAB (MISCELLANEOUS) IMPLANT
NEEDLE HYPO 22GX1.5 SAFETY (NEEDLE) ×2 IMPLANT
NS IRRIG 1000ML POUR BTL (IV SOLUTION) ×2 IMPLANT
OIL CARTRIDGE MAESTRO DRILL (MISCELLANEOUS) ×2
PACK CRANIOTOMY CUSTOM (CUSTOM PROCEDURE TRAY) ×2 IMPLANT
PAD ARMBOARD 7.5X6 YLW CONV (MISCELLANEOUS) ×2 IMPLANT
PATTIES SURGICAL .25X.25 (GAUZE/BANDAGES/DRESSINGS) IMPLANT
PATTIES SURGICAL .5 X.5 (GAUZE/BANDAGES/DRESSINGS) IMPLANT
PATTIES SURGICAL .5 X3 (DISPOSABLE) IMPLANT
PATTIES SURGICAL 1X1 (DISPOSABLE) ×2 IMPLANT
PIN MAYFIELD SKULL DISP (PIN) IMPLANT
SPONGE NEURO XRAY DETECT 1X3 (DISPOSABLE) ×8 IMPLANT
STAPLER SKIN PROX WIDE 3.9 (STAPLE) ×2 IMPLANT
SUT ETHILON 3 0 FSL (SUTURE) IMPLANT
SUT NURALON 4 0 TR CR/8 (SUTURE) ×4 IMPLANT
SUT PROLENE 6 0 BV (SUTURE) IMPLANT
SUT VIC AB 2-0 CP2 18 (SUTURE) ×2 IMPLANT
SUT VIC AB 3-0 FS2 27 (SUTURE) ×2 IMPLANT
SUT VICRYL 4-0 PS2 18IN ABS (SUTURE) IMPLANT
TAPE CLOTH SURG 4X10 WHT LF (GAUZE/BANDAGES/DRESSINGS) ×2 IMPLANT
TOWEL GREEN STERILE (TOWEL DISPOSABLE) ×2 IMPLANT
TOWEL GREEN STERILE FF (TOWEL DISPOSABLE) ×2 IMPLANT
TRAY FOLEY MTR SLVR 16FR STAT (SET/KITS/TRAYS/PACK) IMPLANT
UNDERPAD 30X36 HEAVY ABSORB (UNDERPADS AND DIAPERS) IMPLANT
WATER STERILE IRR 1000ML POUR (IV SOLUTION) ×2 IMPLANT
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
## 2020-06-13 NOTE — Consult Note (Signed)
Responded to page, pt unavailable, no family present, staff will page again if further need of chaplain services. ° °Rev. Iviona Hole °Chaplain °

## 2020-06-13 NOTE — Progress Notes (Signed)
Patient seen and examined. No response to noxious stimulus for me on exam performed without sedation, although hypertensive, so will initiate fentanyl drip for analgosedation. Plan for repeat head CT in AM, monitor neuro exam. Patient remains hypothermic and will continue rewarming efforts. Recheck labs this PM after receiving high volume product resuscitation intra-op. Wean FiO2 down to 50% based on sats. Advance OGT ~7cm based on CXR this AM.   Clinical update provided to mother, Opal Sidles. She provides clinical history of the patient having asthma and "either a light stroke or light heart attack in March or April 2021" and states he received care here at [facility]. Will perform additional chart review to better clarify. She also states that he has baseline dysfunction of the left eye, describing it as a "lazy eye", but that at baseline, he was able to see out of it. Unsure if clinical findings of non-reactive left pupil are related to trauma or baseline ophthalmic state, but will continue to assess. Explained that clinical condition is too early to predict prognosis, however if he survives, there is a high likelihood of needing trach/PEG, and there is the possibility this may be permanent, but again, too early to tell.   Clearly explained no visitation policy due to COVID+ status until patient is off precautions. She verbalizes understanding. Offered the option of video visitation, however she states she does not have electronic resources or friends/family with electronic resources to be able to participate in this option. Discussed decision-making rights and legal NoK. Mother states the patient is unmarried, with no adult children. She states the patient does have a father who is living, [place] Nickels, but that Mr. Doreene Eland has dementia and lives in a nursing home in [place]. She will attempt to provide contact information for Mr. Doreene Eland through Mr. Wilmore's sister. Contact information for Ms. Wyckoff  updated electronically.   Critical care time:  Diamantina Monks, MD General and Trauma Surgery [facility] Surgery
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
## 2020-06-14 ENCOUNTER — Inpatient Hospital Stay (HOSPITAL_COMMUNITY): Payer: Medicaid Other

## 2020-06-14 ENCOUNTER — Encounter (HOSPITAL_COMMUNITY): Payer: Self-pay | Admitting: Neurosurgery

## 2020-06-14 LAB — BPAM FFP
Blood Product Expiration Date: 202201252359
Blood Product Expiration Date: 202201272359
Blood Product Expiration Date: 202201272359
Blood Product Expiration Date: 202201272359
Blood Product Expiration Date: 202201302359
Blood Product Expiration Date: 202201302359
Blood Product Expiration Date: 202201302359
Blood Product Expiration Date: 202201302359
Blood Product Expiration Date: 202201302359
Blood Product Expiration Date: 202201302359
Blood Product Expiration Date: 202201302359
Blood Product Expiration Date: 202201302359
Blood Product Expiration Date: 202201302359
Blood Product Expiration Date: 202201302359
Blood Product Expiration Date: 202201302359
Blood Product Expiration Date: 202201302359
ISSUE DATE / TIME: 202201250311
ISSUE DATE / TIME: 202201250311
ISSUE DATE / TIME: 202201250311
ISSUE DATE / TIME: 202201250311
ISSUE DATE / TIME: 202201250346
ISSUE DATE / TIME: 202201250346
ISSUE DATE / TIME: 202201250346
ISSUE DATE / TIME: 202201250346
ISSUE DATE / TIME: 202201250420
ISSUE DATE / TIME: 202201250420
ISSUE DATE / TIME: 202201250420
ISSUE DATE / TIME: 202201250420
ISSUE DATE / TIME: 202201250429
ISSUE DATE / TIME: 202201250429
ISSUE DATE / TIME: 202201250429
ISSUE DATE / TIME: 202201250429
Unit Type and Rh: 600
Unit Type and Rh: 600
Unit Type and Rh: 600
Unit Type and Rh: 6200
Unit Type and Rh: 6200
Unit Type and Rh: 6200
Unit Type and Rh: 6200
Unit Type and Rh: 6200
Unit Type and Rh: 6200
Unit Type and Rh: 6200
Unit Type and Rh: 6200
Unit Type and Rh: 6200
Unit Type and Rh: 6200
Unit Type and Rh: 6200
Unit Type and Rh: 6200
Unit Type and Rh: 6200

## 2020-06-14 LAB — PREPARE FRESH FROZEN PLASMA
Unit division: 0
Unit division: 0
Unit division: 0
Unit division: 0
Unit division: 0
Unit division: 0
Unit division: 0
Unit division: 0
Unit division: 0
Unit division: 0
Unit division: 0
Unit division: 0

## 2020-06-14 LAB — PREPARE PLATELET PHERESIS
Unit division: 0
Unit division: 0

## 2020-06-14 LAB — TRIGLYCERIDES: Triglycerides: 101 mg/dL (ref <150)

## 2020-06-14 LAB — POCT I-STAT 7, (LYTES, BLD GAS, ICA,H+H)
Acid-Base Excess: 2 mmol/L (ref 0.0–2.0)
Bicarbonate: 26.1 mmol/L (ref 20.0–28.0)
Calcium, Ion: 1.15 mmol/L (ref 1.15–1.40)
HCT: 31 % — ABNORMAL LOW (ref 39.0–52.0)
Hemoglobin: 10.5 g/dL — ABNORMAL LOW (ref 13.0–17.0)
O2 Saturation: 98 %
Patient temperature: 97.8
Potassium: 3.9 mmol/L (ref 3.5–5.1)
Sodium: 154 mmol/L — ABNORMAL HIGH (ref 135–145)
TCO2: 27 mmol/L (ref 22–32)
pCO2 arterial: 36.7 mmHg (ref 32.0–48.0)
pH, Arterial: 7.458 — ABNORMAL HIGH (ref 7.350–7.450)
pO2, Arterial: 105 mmHg (ref 83.0–108.0)

## 2020-06-14 LAB — BASIC METABOLIC PANEL
Anion gap: 8 (ref 5–15)
BUN: 12 mg/dL (ref 6–20)
CO2: 24 mmol/L (ref 22–32)
Calcium: 7.8 mg/dL — ABNORMAL LOW (ref 8.9–10.3)
Chloride: 120 mmol/L — ABNORMAL HIGH (ref 98–111)
Creatinine, Ser: 1.38 mg/dL — ABNORMAL HIGH (ref 0.61–1.24)
GFR, Estimated: >60 mL/min (ref >60)
Glucose, Bld: 106 mg/dL — ABNORMAL HIGH (ref 70–99)
Potassium: 3.7 mmol/L (ref 3.5–5.1)
Sodium: 152 mmol/L — ABNORMAL HIGH (ref 135–145)

## 2020-06-14 LAB — BPAM PLATELET PHERESIS
Blood Product Expiration Date: 202201272359
Blood Product Expiration Date: 202201272359
ISSUE DATE / TIME: 202201250348
ISSUE DATE / TIME: 202201250441
Unit Type and Rh: 2800
Unit Type and Rh: 6200

## 2020-06-14 LAB — CALCIUM, IONIZED: Calcium, Ionized, Serum: 4.8 mg/dL (ref 4.5–5.6)

## 2020-06-14 LAB — PHOSPHORUS: Phosphorus: 3.2 mg/dL (ref 2.5–4.6)

## 2020-06-14 LAB — BPAM CRYOPRECIPITATE
Blood Product Expiration Date: 202201251027
ISSUE DATE / TIME: 202201250445
Unit Type and Rh: 6200

## 2020-06-14 LAB — CBC
HCT: 35.6 % — ABNORMAL LOW (ref 39.0–52.0)
Hemoglobin: 12.3 g/dL — ABNORMAL LOW (ref 13.0–17.0)
MCH: 30.6 pg (ref 26.0–34.0)
MCHC: 34.6 g/dL (ref 30.0–36.0)
MCV: 88.6 fL (ref 80.0–100.0)
Platelets: 103 10*3/uL — ABNORMAL LOW (ref 150–400)
RBC: 4.02 MIL/uL — ABNORMAL LOW (ref 4.22–5.81)
RDW: 14.9 % (ref 11.5–15.5)
WBC: 10.8 10*3/uL — ABNORMAL HIGH (ref 4.0–10.5)
nRBC: 0 % (ref 0.0–0.2)

## 2020-06-14 LAB — PREPARE CRYOPRECIPITATE: Unit division: 0

## 2020-06-14 LAB — MAGNESIUM: Magnesium: 1.9 mg/dL (ref 1.7–2.4)

## 2020-06-14 MED ORDER — METHOCARBAMOL 500 MG PO TABS
1000.0000 mg | ORAL_TABLET | Freq: Three times a day (TID) | ORAL | Status: DC
  Administered 2020-06-14 – 2020-06-19 (×15): 1000 mg
  Filled 2020-06-14 (×15): qty 2

## 2020-06-14 MED ORDER — PIVOT 1.5 CAL PO LIQD
1000.0000 mL | ORAL | Status: DC
  Administered 2020-06-14 – 2020-06-18 (×4): 1000 mL
  Filled 2020-06-14 (×9): qty 1000

## 2020-06-14 MED ORDER — PROMETHAZINE HCL 25 MG PO TABS: 12.5000 mg | ORAL_TABLET | ORAL | Status: DC | PRN

## 2020-06-14 MED ORDER — ACETAMINOPHEN 500 MG PO TABS
1000.0000 mg | ORAL_TABLET | Freq: Four times a day (QID) | ORAL | Status: DC
  Administered 2020-06-14 – 2020-06-19 (×20): 1000 mg
  Filled 2020-06-14 (×20): qty 2

## 2020-06-14 MED ORDER — PROSOURCE TF PO LIQD
45.0000 mL | Freq: Two times a day (BID) | ORAL | Status: DC
  Administered 2020-06-14: 45 mL
  Filled 2020-06-14: qty 45

## 2020-06-14 MED ORDER — VITAL HIGH PROTEIN PO LIQD: 1000.0000 mL | ORAL | Status: DC

## 2020-06-14 MED ORDER — PANTOPRAZOLE SODIUM 40 MG PO PACK
40.0000 mg | PACK | Freq: Every day | ORAL | Status: DC
  Administered 2020-06-15 – 2020-06-19 (×5): 40 mg
  Filled 2020-06-14 (×5): qty 20

## 2020-06-14 MED ORDER — ACETAMINOPHEN 650 MG RE SUPP: 650.0000 mg | RECTAL | Status: DC | PRN

## 2020-06-14 MED ORDER — LEVETIRACETAM 100 MG/ML PO SOLN
500.0000 mg | Freq: Two times a day (BID) | ORAL | Status: DC
  Administered 2020-06-14 – 2020-06-19 (×10): 500 mg
  Filled 2020-06-14 (×10): qty 5

## 2020-06-14 MED ORDER — ACETAMINOPHEN 325 MG PO TABS: 650.0000 mg | ORAL_TABLET | ORAL | Status: DC | PRN

## 2020-06-14 MED ORDER — FREE WATER
200.0000 mL | Freq: Four times a day (QID) | Status: DC
  Administered 2020-06-14 – 2020-06-19 (×20): 200 mL

## 2020-06-14 MED FILL — Thrombin For Soln 20000 Unit: CUTANEOUS | Qty: 1 | Status: AC

## 2020-06-14 MED FILL — Thrombin For Soln 5000 Unit: CUTANEOUS | Qty: 5000 | Status: AC
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
## 2020-06-14 NOTE — Progress Notes (Signed)
Cortrak Tube Team Note:  Consult received to place a Cortrak feeding tube.   Due to high volume of Cortrak tube consults unable to place Cortrak tube for patient today. Discussed with RN. Patient currently with OGT and tube feeds are infusing at this time.   Felix Pacini, MS, RD, LDN Pager number available on Amion
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
## 2020-06-14 NOTE — Progress Notes (Signed)
Patient transported to CT scan and back to [id number] without issues.
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
## 2020-06-15 LAB — CBC
HCT: 31.1 % — ABNORMAL LOW (ref 39.0–52.0)
Hemoglobin: 10.3 g/dL — ABNORMAL LOW (ref 13.0–17.0)
MCH: 30.8 pg (ref 26.0–34.0)
MCHC: 33.1 g/dL (ref 30.0–36.0)
MCV: 93.1 fL (ref 80.0–100.0)
Platelets: 100 10*3/uL — ABNORMAL LOW (ref 150–400)
RBC: 3.34 MIL/uL — ABNORMAL LOW (ref 4.22–5.81)
RDW: 15.6 % — ABNORMAL HIGH (ref 11.5–15.5)
WBC: 8.5 10*3/uL (ref 4.0–10.5)
nRBC: 0 % (ref 0.0–0.2)

## 2020-06-15 LAB — BASIC METABOLIC PANEL
Anion gap: 5 (ref 5–15)
BUN: 15 mg/dL (ref 6–20)
CO2: 24 mmol/L (ref 22–32)
Calcium: 7.9 mg/dL — ABNORMAL LOW (ref 8.9–10.3)
Chloride: 124 mmol/L — ABNORMAL HIGH (ref 98–111)
Creatinine, Ser: 1.16 mg/dL (ref 0.61–1.24)
GFR, Estimated: >60 mL/min (ref >60)
Glucose, Bld: 138 mg/dL — ABNORMAL HIGH (ref 70–99)
Potassium: 3.9 mmol/L (ref 3.5–5.1)
Sodium: 153 mmol/L — ABNORMAL HIGH (ref 135–145)

## 2020-06-15 LAB — PHOSPHORUS: Phosphorus: 1.9 mg/dL — ABNORMAL LOW (ref 2.5–4.6)

## 2020-06-15 LAB — GLUCOSE, CAPILLARY
Glucose-Capillary: 118 mg/dL — ABNORMAL HIGH (ref 70–99)
Glucose-Capillary: 135 mg/dL — ABNORMAL HIGH (ref 70–99)

## 2020-06-15 LAB — MAGNESIUM: Magnesium: 2.1 mg/dL (ref 1.7–2.4)

## 2020-06-15 MED ORDER — BETHANECHOL CHLORIDE 10 MG PO TABS
25.0000 mg | ORAL_TABLET | Freq: Three times a day (TID) | ORAL | Status: DC
  Administered 2020-06-15 – 2020-06-19 (×12): 25 mg
  Filled 2020-06-15 (×12): qty 3

## 2020-06-15 MED ORDER — POTASSIUM PHOSPHATES 15 MMOLE/5ML IV SOLN
30.0000 mmol | Freq: Once | INTRAVENOUS | Status: AC
  Administered 2020-06-15: 30 mmol via INTRAVENOUS
  Filled 2020-06-15: qty 10
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
## 2020-06-16 DIAGNOSIS — Z7189 Other specified counseling: Secondary | ICD-10-CM

## 2020-06-16 DIAGNOSIS — Z66 Do not resuscitate: Secondary | ICD-10-CM

## 2020-06-16 DIAGNOSIS — Z515 Encounter for palliative care: Secondary | ICD-10-CM

## 2020-06-16 LAB — BASIC METABOLIC PANEL
Anion gap: 7 (ref 5–15)
BUN: 13 mg/dL (ref 6–20)
CO2: 24 mmol/L (ref 22–32)
Calcium: 8.1 mg/dL — ABNORMAL LOW (ref 8.9–10.3)
Chloride: 123 mmol/L — ABNORMAL HIGH (ref 98–111)
Creatinine, Ser: 1.14 mg/dL (ref 0.61–1.24)
GFR, Estimated: >60 mL/min (ref >60)
Glucose, Bld: 139 mg/dL — ABNORMAL HIGH (ref 70–99)
Potassium: 3.7 mmol/L (ref 3.5–5.1)
Sodium: 154 mmol/L — ABNORMAL HIGH (ref 135–145)

## 2020-06-16 LAB — GLUCOSE, CAPILLARY
Glucose-Capillary: 111 mg/dL — ABNORMAL HIGH (ref 70–99)
Glucose-Capillary: 118 mg/dL — ABNORMAL HIGH (ref 70–99)
Glucose-Capillary: 132 mg/dL — ABNORMAL HIGH (ref 70–99)

## 2020-06-16 LAB — CBC
HCT: 31.4 % — ABNORMAL LOW (ref 39.0–52.0)
Hemoglobin: 9.9 g/dL — ABNORMAL LOW (ref 13.0–17.0)
MCH: 30.2 pg (ref 26.0–34.0)
MCHC: 31.5 g/dL (ref 30.0–36.0)
MCV: 95.7 fL (ref 80.0–100.0)
Platelets: 121 10*3/uL — ABNORMAL LOW (ref 150–400)
RBC: 3.28 MIL/uL — ABNORMAL LOW (ref 4.22–5.81)
RDW: 15.9 % — ABNORMAL HIGH (ref 11.5–15.5)
WBC: 5.9 10*3/uL (ref 4.0–10.5)
nRBC: 0.3 % — ABNORMAL HIGH (ref 0.0–0.2)

## 2020-06-16 LAB — PHOSPHORUS: Phosphorus: 2.8 mg/dL (ref 2.5–4.6)

## 2020-06-16 LAB — MAGNESIUM: Magnesium: 2.2 mg/dL (ref 1.7–2.4)
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
## 2020-06-16 NOTE — Progress Notes (Deleted)
   06/16/20 1617  Clinical Encounter Type  Visited With Other (Comment) (Chaplain called patient on the phone)  Visit Type Spiritual support  Referral From Nurse  Consult/Referral To Chaplain  Chaplain responded to consult. Chaplain called patient's room, patient did not answer. Chaplain will request follow up. This note was prepared by Deneen Harts, M.Div..  For questions please contact by phone [phone].
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
## 2020-06-16 NOTE — Progress Notes (Addendum)
I dictated this on the wrong patient.
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
## 2020-06-16 NOTE — Progress Notes (Signed)
This chaplain responded to PMT consult for spiritual care.  This chaplain reviewed the chart notes and spoke to the Pt. RN-Lisa.  Family is not present.  This chaplain understands Dr. Bedelia Person will be speaking to the Pt. family soon. Respecting the family's desire for "grace" as printed in the chart notes, the spiritual care department will coordinate with the RN to determine the best time for a spiritual care visit.

## 2020-06-17 LAB — TYPE AND SCREEN
ABO/RH(D): A POS
Antibody Screen: NEGATIVE
Unit division: 0
Unit division: 0
Unit division: 0
Unit division: 0
Unit division: 0
Unit division: 0
Unit division: 0
Unit division: 0
Unit division: 0
Unit division: 0
Unit division: 0
Unit division: 0
Unit division: 0
Unit division: 0
Unit division: 0
Unit division: 0
Unit division: 0
Unit division: 0
Unit division: 0
Unit division: 0

## 2020-06-17 LAB — BPAM RBC
Blood Product Expiration Date: 202202112359
Blood Product Expiration Date: 202202122359
Blood Product Expiration Date: 202202142359
Blood Product Expiration Date: 202202142359
Blood Product Expiration Date: 202202142359
Blood Product Expiration Date: 202202142359
Blood Product Expiration Date: 202202152359
Blood Product Expiration Date: 202202152359
Blood Product Expiration Date: 202202162359
Blood Product Expiration Date: 202202162359
Blood Product Expiration Date: 202202162359
Blood Product Expiration Date: 202202162359
Blood Product Expiration Date: 202202162359
Blood Product Expiration Date: 202202162359
Blood Product Expiration Date: 202202162359
Blood Product Expiration Date: 202202162359
Blood Product Expiration Date: 202202252359
Blood Product Expiration Date: 202202252359
Blood Product Expiration Date: 202202252359
Blood Product Expiration Date: 202202252359
ISSUE DATE / TIME: 202201250237
ISSUE DATE / TIME: 202201250237
ISSUE DATE / TIME: 202201250316
ISSUE DATE / TIME: 202201250316
ISSUE DATE / TIME: 202201250316
ISSUE DATE / TIME: 202201250316
ISSUE DATE / TIME: 202201250347
ISSUE DATE / TIME: 202201250347
ISSUE DATE / TIME: 202201250347
ISSUE DATE / TIME: 202201250347
ISSUE DATE / TIME: 202201250407
ISSUE DATE / TIME: 202201250407
ISSUE DATE / TIME: 202201250407
ISSUE DATE / TIME: 202201250407
ISSUE DATE / TIME: 202201250434
ISSUE DATE / TIME: 202201250434
ISSUE DATE / TIME: 202201250434
ISSUE DATE / TIME: 202201250434
ISSUE DATE / TIME: 202201261812
ISSUE DATE / TIME: 202201281041
Unit Type and Rh: 5100
Unit Type and Rh: 5100
Unit Type and Rh: 5100
Unit Type and Rh: 5100
Unit Type and Rh: 6200
Unit Type and Rh: 6200
Unit Type and Rh: 6200
Unit Type and Rh: 6200
Unit Type and Rh: 6200
Unit Type and Rh: 6200
Unit Type and Rh: 6200
Unit Type and Rh: 6200
Unit Type and Rh: 6200
Unit Type and Rh: 6200
Unit Type and Rh: 6200
Unit Type and Rh: 6200
Unit Type and Rh: 6200
Unit Type and Rh: 6200
Unit Type and Rh: 6200
Unit Type and Rh: 6200

## 2020-06-17 LAB — BASIC METABOLIC PANEL
Anion gap: 10 (ref 5–15)
Anion gap: 7 (ref 5–15)
BUN: 12 mg/dL (ref 6–20)
BUN: 16 mg/dL (ref 6–20)
CO2: 19 mmol/L — ABNORMAL LOW (ref 22–32)
CO2: 27 mmol/L (ref 22–32)
Calcium: 6.2 mg/dL — CL (ref 8.9–10.3)
Calcium: 7.7 mg/dL — ABNORMAL LOW (ref 8.9–10.3)
Chloride: 123 mmol/L — ABNORMAL HIGH (ref 98–111)
Chloride: 116 mmol/L — ABNORMAL HIGH (ref 98–111)
Creatinine, Ser: 0.85 mg/dL (ref 0.61–1.24)
Creatinine, Ser: 1.12 mg/dL (ref 0.61–1.24)
GFR, Estimated: >60 mL/min (ref >60)
GFR, Estimated: >60 mL/min (ref >60)
Glucose, Bld: 124 mg/dL — ABNORMAL HIGH (ref 70–99)
Glucose, Bld: 174 mg/dL — ABNORMAL HIGH (ref 70–99)
Potassium: 2.8 mmol/L — ABNORMAL LOW (ref 3.5–5.1)
Potassium: 3.5 mmol/L (ref 3.5–5.1)
Sodium: 152 mmol/L — ABNORMAL HIGH (ref 135–145)
Sodium: 150 mmol/L — ABNORMAL HIGH (ref 135–145)

## 2020-06-17 LAB — PHOSPHORUS
Phosphorus: 2.7 mg/dL (ref 2.5–4.6)
Phosphorus: 3 mg/dL (ref 2.5–4.6)

## 2020-06-17 LAB — CBC
HCT: 29.1 % — ABNORMAL LOW (ref 39.0–52.0)
Hemoglobin: 8.9 g/dL — ABNORMAL LOW (ref 13.0–17.0)
MCH: 30.2 pg (ref 26.0–34.0)
MCHC: 30.6 g/dL (ref 30.0–36.0)
MCV: 98.6 fL (ref 80.0–100.0)
Platelets: 140 10*3/uL — ABNORMAL LOW (ref 150–400)
RBC: 2.95 MIL/uL — ABNORMAL LOW (ref 4.22–5.81)
RDW: 15.6 % — ABNORMAL HIGH (ref 11.5–15.5)
WBC: 3.9 10*3/uL — ABNORMAL LOW (ref 4.0–10.5)
nRBC: 0.5 % — ABNORMAL HIGH (ref 0.0–0.2)

## 2020-06-17 LAB — POTASSIUM: Potassium: 3.5 mmol/L (ref 3.5–5.1)

## 2020-06-17 LAB — MAGNESIUM: Magnesium: 1.4 mg/dL — ABNORMAL LOW (ref 1.7–2.4)

## 2020-06-17 LAB — GLUCOSE, CAPILLARY
Glucose-Capillary: 107 mg/dL — ABNORMAL HIGH (ref 70–99)
Glucose-Capillary: 116 mg/dL — ABNORMAL HIGH (ref 70–99)
Glucose-Capillary: 134 mg/dL — ABNORMAL HIGH (ref 70–99)
Glucose-Capillary: 140 mg/dL — ABNORMAL HIGH (ref 70–99)
Glucose-Capillary: 153 mg/dL — ABNORMAL HIGH (ref 70–99)
Glucose-Capillary: 157 mg/dL — ABNORMAL HIGH (ref 70–99)
Glucose-Capillary: 166 mg/dL — ABNORMAL HIGH (ref 70–99)

## 2020-06-17 LAB — TRIGLYCERIDES: Triglycerides: 47 mg/dL (ref <150)
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
## 2020-06-17 NOTE — Progress Notes (Signed)
Assisted tele visit to patient with family member.  Milley Vining R, RN  

## 2020-06-17 NOTE — Progress Notes (Signed)
Assisted tele visit to patient with family member.  Jaquavious Mercer R, RN  

## 2020-06-18 LAB — BASIC METABOLIC PANEL
Anion gap: 8 (ref 5–15)
BUN: 18 mg/dL (ref 6–20)
CO2: 27 mmol/L (ref 22–32)
Calcium: 8.2 mg/dL — ABNORMAL LOW (ref 8.9–10.3)
Chloride: 115 mmol/L — ABNORMAL HIGH (ref 98–111)
Creatinine, Ser: 1.22 mg/dL (ref 0.61–1.24)
GFR, Estimated: >60 mL/min (ref >60)
Glucose, Bld: 134 mg/dL — ABNORMAL HIGH (ref 70–99)
Potassium: 3.5 mmol/L (ref 3.5–5.1)
Sodium: 150 mmol/L — ABNORMAL HIGH (ref 135–145)

## 2020-06-18 LAB — GLUCOSE, CAPILLARY
Glucose-Capillary: 130 mg/dL — ABNORMAL HIGH (ref 70–99)
Glucose-Capillary: 134 mg/dL — ABNORMAL HIGH (ref 70–99)
Glucose-Capillary: 143 mg/dL — ABNORMAL HIGH (ref 70–99)

## 2020-06-18 LAB — CBC
HCT: 29.5 % — ABNORMAL LOW (ref 39.0–52.0)
Hemoglobin: 9.2 g/dL — ABNORMAL LOW (ref 13.0–17.0)
MCH: 30.8 pg (ref 26.0–34.0)
MCHC: 31.2 g/dL (ref 30.0–36.0)
MCV: 98.7 fL (ref 80.0–100.0)
Platelets: 162 10*3/uL (ref 150–400)
RBC: 2.99 MIL/uL — ABNORMAL LOW (ref 4.22–5.81)
RDW: 15.3 % (ref 11.5–15.5)
WBC: 10.4 10*3/uL (ref 4.0–10.5)
nRBC: 0.5 % — ABNORMAL HIGH (ref 0.0–0.2)

## 2020-06-18 LAB — PHOSPHORUS: Phosphorus: 2.6 mg/dL (ref 2.5–4.6)

## 2020-06-18 LAB — MAGNESIUM: Magnesium: 2.1 mg/dL (ref 1.7–2.4)
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
## 2020-06-18 NOTE — Progress Notes (Signed)
Assisted tele visit to patient with family member.  Ladarious Kresse R, RN  

## 2020-06-19 LAB — GLUCOSE, CAPILLARY
Glucose-Capillary: 133 mg/dL — ABNORMAL HIGH (ref 70–99)
Glucose-Capillary: 144 mg/dL — ABNORMAL HIGH (ref 70–99)
Glucose-Capillary: 160 mg/dL — ABNORMAL HIGH (ref 70–99)

## 2020-06-19 MED ORDER — DEXTROSE 5 % IV SOLN: INTRAVENOUS | Status: DC

## 2020-06-19 MED ORDER — GLYCOPYRROLATE 0.2 MG/ML IJ SOLN: 0.2000 mg | INTRAMUSCULAR | Status: DC | PRN

## 2020-06-19 MED ORDER — GLYCOPYRROLATE 1 MG PO TABS
1.0000 mg | ORAL_TABLET | ORAL | Status: DC | PRN
  Filled 2020-06-19: qty 1

## 2020-06-19 MED ORDER — HALOPERIDOL LACTATE 2 MG/ML PO CONC
0.5000 mg | ORAL | Status: DC | PRN
  Filled 2020-06-19: qty 0.3

## 2020-06-19 MED ORDER — LORAZEPAM 2 MG/ML IJ SOLN: 2.0000 mg | INTRAMUSCULAR | Status: DC

## 2020-06-19 MED ORDER — POLYVINYL ALCOHOL 1.4 % OP SOLN
1.0000 [drp] | Freq: Four times a day (QID) | OPHTHALMIC | Status: DC | PRN
  Filled 2020-06-19: qty 15

## 2020-06-19 MED ORDER — HALOPERIDOL LACTATE 5 MG/ML IJ SOLN: 0.5000 mg | INTRAMUSCULAR | Status: DC | PRN

## 2020-06-19 MED ORDER — HALOPERIDOL 0.5 MG PO TABS
0.5000 mg | ORAL_TABLET | ORAL | Status: DC | PRN
  Filled 2020-06-19: qty 1

## 2020-06-19 MED ORDER — LORAZEPAM 2 MG/ML IJ SOLN: 2.0000 mg | INTRAMUSCULAR | Status: DC | PRN

## 2020-06-19 MED ORDER — ACETAMINOPHEN 325 MG PO TABS
650.0000 mg | ORAL_TABLET | Freq: Four times a day (QID) | ORAL | Status: DC | PRN
Start: 2020-06-19 — End: 2020-06-19

## 2020-06-19 MED ORDER — ACETAMINOPHEN 650 MG RE SUPP: 650.0000 mg | Freq: Four times a day (QID) | RECTAL | Status: DC | PRN

## 2020-06-19 MED ORDER — DIPHENHYDRAMINE HCL 50 MG/ML IJ SOLN: 25.0000 mg | INTRAMUSCULAR | Status: DC | PRN

## 2020-06-19 MED ORDER — FENTANYL BOLUS VIA INFUSION
50.0000 ug | INTRAVENOUS | Status: DC | PRN
  Filled 2020-06-19: qty 150

## 2020-06-19 MED ORDER — GLYCOPYRROLATE 0.2 MG/ML IJ SOLN
0.4000 mg | INTRAMUSCULAR | Status: DC
  Administered 2020-06-19: 0.4 mg via INTRAVENOUS
  Filled 2020-06-19: qty 2

## 2020-06-19 MED ORDER — CLONAZEPAM 1 MG PO TABS
1.0000 mg | ORAL_TABLET | Freq: Two times a day (BID) | ORAL | Status: DC
  Administered 2020-06-19: 1 mg
  Filled 2020-06-19: qty 1

## 2020-06-19 MED ORDER — QUETIAPINE FUMARATE 100 MG PO TABS
100.0000 mg | ORAL_TABLET | Freq: Two times a day (BID) | ORAL | Status: DC
  Administered 2020-06-19: 100 mg
  Filled 2020-06-19: qty 1

## 2020-06-20 ENCOUNTER — Encounter: Payer: Self-pay | Admitting: General Surgery

## 2020-06-20 NOTE — Progress Notes (Signed)
Palliative Medicine RN Note: Note order for Spiritual Care to assist with video call to family member in [facility]. PMT Chaplain is out of the office today; I paged [facility] Spirtual [facility] to ensure they are aware of the order.  Margret Chance Natania Finigan, RN, BSN, [facility] [place] Palliative Medicine Team 11-Jul-2020 10:22 AM Office [phone]

## 2020-06-20 NOTE — Progress Notes (Signed)
Assisted tele visit to patient with family member.  Isela Stantz R, RN  

## 2020-06-20 NOTE — Progress Notes (Signed)
Per order, pt was terminally extubated to room air. 
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
## 2020-06-20 NOTE — Progress Notes (Signed)
   06/02/2020 1300  Clinical Encounter Type  Visited With Health care provider  Visit Type Follow-up  Referral From Palliative care team  Consult/Referral To Chaplain   Chaplain responded to call from PMT NP Helmut Muster. NP Helmut Muster stated that Pt brother's phone call doesn't have to be coordinated with other family so can coordinate with Pt's nurse, Gala Murdoch. After speaking with [facility] chaplain, Christiane Ha, and RN, Gala Murdoch, a tentative time of 3:30 pm was chosen.   This note was prepared by Chaplain Resident, Tacy Learn, MDiv. Chaplain remains available as needed through the on-call pager: [phone].

## 2020-06-20 NOTE — Progress Notes (Signed)
   06/17/2020 1022  Clinical Encounter Type  Visited With Health care provider  Visit Type Follow-up  Referral From Palliative care team  Consult/Referral To Chaplain   Chaplain responded to page from PMT RN, Shawna Orleans, regarding family video call. This chaplain was unaware of procedure for cases like this and reached out to PMT chaplain, Alvino Chapel, for assistance. Pt's brother, Armonie Staten, is at [facility]. This Chaplain spoke with [facility]'s chaplain, Jayme Cloud, [phone]. Chaplain Christiane Ha stated he would speak with jail staff about what might be possible. The jail does not have the resources to do a video call at this time.  This note was prepared by Chaplain Resident, Tacy Learn, MDiv. Chaplain remains available as needed through the on-call pager: [phone].
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
## 2020-06-20 NOTE — Progress Notes (Signed)
   07/15/2020 1500  Clinical Encounter Type  Visited With Health care provider  Visit Type Follow-up  Referral From Nurse  Consult/Referral To Chaplain   Chaplain facilitated with [facility]'s chaplain, Jayme Cloud, for Pt's brother to speak with Pt. Pt's nurse, Odette Horns, took phone in to Pt's mom for family conversation. Chaplain remains available.   This note was prepared by Chaplain Resident, Tacy Learn, MDiv. Chaplain remains available as needed through the on-call pager: [phone].

## 2020-06-20 NOTE — Progress Notes (Signed)
   07/11/20 1250  Clinical Encounter Type  Visited With Health care provider  Visit Type Follow-up  Referral From Palliative care team  Consult/Referral To Chaplain   Chaplain received call from [facility] chaplain asking about call time for this afternoon. Chaplain reached out to PMT again and there was no answer. [facility] chaplain stated he would be available for the next couple hours to coordinate a time.   This note was prepared by Chaplain Resident, Tacy Learn, MDiv. Chaplain remains available as needed through the on-call pager: [phone].

## 2020-06-20 NOTE — Progress Notes (Signed)
Subjective: The patient is comatose and intubated.  He is in no apparent distress.  Objective: Vital signs in last 24 hours: Temp:  [98.7 F (37.1 C)-100.1 F (37.8 C)] 98.7 F (37.1 C) (01/31 0800) Pulse Rate:  [64-83] 74 (01/31 1000) Resp:  [23-24] 24 (01/31 1000) BP: (118-169)/(58-92) 169/88 (01/31 1000) SpO2:  [97 %-100 %] 99 % (01/31 1113) Arterial Line BP: (80-207)/(62-97) 205/84 (01/31 0400) FiO2 (%):  [40 %] 40 % (01/31 1113) Weight:  [77 kg] 77 kg (01/31 0500) Estimated body mass index is 27.4 kg/m as calculated from the following:   Height as of this encounter: 5\' 6"  (1.676 m).   Weight as of this encounter: 77 kg.   Intake/Output from previous day: 01/30 0701 - 01/31 0700 In: 2692.2 [I.V.:797.2; NG/GT:1895] Out: 3135 [Urine:3125; Drains:10] Intake/Output this shift: Total I/O In: 135.7 [I.V.:135.7] Out: 650 [Urine:650]  Physical exam Glasgow Coma Scale 3 intubated.  He has no response to painful stimuli.  His right pupil is approximately 3 mm and reacts.  Lab Results: Recent Labs    06/17/20 0623 06/18/20 0449  WBC 3.9* 10.4  HGB 8.9* 9.2*  HCT 29.1* 29.5*  PLT 140* 162   BMET Recent Labs    06/17/20 1225 06/18/20 0449  NA 150* 150*  K 3.5 3.5  CL 116* 115*  CO2 27 27  GLUCOSE 174* 134*  BUN 16 18  CREATININE 1.12 1.22  CALCIUM 7.7* 8.2*    Studies/Results: No results found.  Assessment/Plan: Postop day #6: The patient's prognosis is very poor.  By report the family is transitioning to comfort care only.  This seems appropriate.  LOS: 6 days     06/20/20 05/21/2020, 1:09 PM
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
## 2020-06-20 NOTE — Progress Notes (Signed)
125 mls of fentanyl wasted in sink, witnessed by Sherrie George RN

## 2020-06-20 DEATH — deceased
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
